# Patient Record
Sex: Female | Born: 1975
Health system: Southern US, Community
[De-identification: ages and names within clinical notes are randomized; demographics above are authoritative.]

## PROBLEM LIST (undated history)

## (undated) ENCOUNTER — Inpatient Hospital Stay (HOSPITAL_COMMUNITY): Payer: Self-pay

## (undated) DIAGNOSIS — N390 Urinary tract infection, site not specified: Secondary | ICD-10-CM

## (undated) DIAGNOSIS — E669 Obesity, unspecified: Secondary | ICD-10-CM

## (undated) DIAGNOSIS — E7800 Pure hypercholesterolemia, unspecified: Secondary | ICD-10-CM

## (undated) DIAGNOSIS — O24419 Gestational diabetes mellitus in pregnancy, unspecified control: Secondary | ICD-10-CM

## (undated) DIAGNOSIS — E119 Type 2 diabetes mellitus without complications: Secondary | ICD-10-CM

## (undated) HISTORY — DX: Pure hypercholesterolemia, unspecified: E78.00

## (undated) HISTORY — DX: Type 2 diabetes mellitus without complications: E11.9

## (undated) HISTORY — DX: Obesity, unspecified: E66.9

## (undated) HISTORY — DX: Gestational diabetes mellitus in pregnancy, unspecified control: O24.419

---

## 2001-02-25 ENCOUNTER — Inpatient Hospital Stay (HOSPITAL_COMMUNITY): Admission: AD | Admit: 2001-02-25 | Discharge: 2001-02-25 | Payer: Self-pay | Admitting: *Deleted

## 2001-02-25 ENCOUNTER — Encounter: Payer: Self-pay | Admitting: *Deleted

## 2001-03-26 ENCOUNTER — Encounter: Payer: Self-pay | Admitting: Emergency Medicine

## 2001-03-26 ENCOUNTER — Inpatient Hospital Stay (HOSPITAL_COMMUNITY): Admission: AD | Admit: 2001-03-26 | Discharge: 2001-03-26 | Payer: Self-pay | Admitting: *Deleted

## 2001-05-07 ENCOUNTER — Encounter: Payer: Self-pay | Admitting: Obstetrics and Gynecology

## 2001-05-07 ENCOUNTER — Inpatient Hospital Stay (HOSPITAL_COMMUNITY): Admission: AD | Admit: 2001-05-07 | Discharge: 2001-05-08 | Payer: Self-pay | Admitting: *Deleted

## 2001-05-20 ENCOUNTER — Encounter: Admission: RE | Admit: 2001-05-20 | Discharge: 2001-05-20 | Payer: Self-pay | Admitting: *Deleted

## 2001-05-23 ENCOUNTER — Inpatient Hospital Stay (HOSPITAL_COMMUNITY): Admission: AD | Admit: 2001-05-23 | Discharge: 2001-05-23 | Payer: Self-pay | Admitting: *Deleted

## 2001-05-27 ENCOUNTER — Encounter: Admission: RE | Admit: 2001-05-27 | Discharge: 2001-05-27 | Payer: Self-pay | Admitting: *Deleted

## 2001-06-09 ENCOUNTER — Ambulatory Visit (HOSPITAL_COMMUNITY): Admission: RE | Admit: 2001-06-09 | Discharge: 2001-06-09 | Payer: Self-pay | Admitting: *Deleted

## 2001-06-10 ENCOUNTER — Encounter: Admission: RE | Admit: 2001-06-10 | Discharge: 2001-06-10 | Payer: Self-pay | Admitting: *Deleted

## 2001-06-17 ENCOUNTER — Encounter: Admission: RE | Admit: 2001-06-17 | Discharge: 2001-06-17 | Payer: Self-pay | Admitting: *Deleted

## 2001-06-24 ENCOUNTER — Encounter: Admission: RE | Admit: 2001-06-24 | Discharge: 2001-06-24 | Payer: Self-pay | Admitting: *Deleted

## 2001-06-24 ENCOUNTER — Encounter (HOSPITAL_COMMUNITY): Admission: RE | Admit: 2001-06-24 | Discharge: 2001-07-24 | Payer: Self-pay | Admitting: *Deleted

## 2001-06-28 ENCOUNTER — Encounter: Payer: Self-pay | Admitting: Obstetrics and Gynecology

## 2001-06-28 ENCOUNTER — Inpatient Hospital Stay (HOSPITAL_COMMUNITY): Admission: AD | Admit: 2001-06-28 | Discharge: 2001-06-30 | Payer: Self-pay | Admitting: *Deleted

## 2001-07-21 ENCOUNTER — Encounter: Payer: Self-pay | Admitting: *Deleted

## 2001-07-24 ENCOUNTER — Inpatient Hospital Stay (HOSPITAL_COMMUNITY): Admission: AD | Admit: 2001-07-24 | Discharge: 2001-07-27 | Payer: Self-pay | Admitting: *Deleted

## 2004-03-22 ENCOUNTER — Ambulatory Visit: Payer: Self-pay | Admitting: Family Medicine

## 2004-03-22 ENCOUNTER — Ambulatory Visit: Payer: Self-pay | Admitting: *Deleted

## 2004-04-09 ENCOUNTER — Ambulatory Visit: Payer: Self-pay | Admitting: Family Medicine

## 2004-04-09 ENCOUNTER — Emergency Department (HOSPITAL_COMMUNITY): Admission: EM | Admit: 2004-04-09 | Discharge: 2004-04-10 | Payer: Self-pay | Admitting: Emergency Medicine

## 2004-04-11 ENCOUNTER — Ambulatory Visit: Payer: Self-pay | Admitting: Internal Medicine

## 2004-04-12 ENCOUNTER — Emergency Department (HOSPITAL_COMMUNITY): Admission: EM | Admit: 2004-04-12 | Discharge: 2004-04-12 | Payer: Self-pay | Admitting: Emergency Medicine

## 2004-07-25 ENCOUNTER — Ambulatory Visit: Payer: Self-pay | Admitting: Internal Medicine

## 2004-08-16 ENCOUNTER — Ambulatory Visit: Payer: Self-pay | Admitting: Family Medicine

## 2005-07-09 ENCOUNTER — Ambulatory Visit: Payer: Self-pay | Admitting: Internal Medicine

## 2006-01-23 ENCOUNTER — Ambulatory Visit: Payer: Self-pay | Admitting: Internal Medicine

## 2006-01-24 ENCOUNTER — Ambulatory Visit (HOSPITAL_COMMUNITY): Admission: RE | Admit: 2006-01-24 | Discharge: 2006-01-24 | Payer: Self-pay | Admitting: Internal Medicine

## 2006-04-21 ENCOUNTER — Ambulatory Visit: Payer: Self-pay | Admitting: Internal Medicine

## 2006-10-01 ENCOUNTER — Encounter (INDEPENDENT_AMBULATORY_CARE_PROVIDER_SITE_OTHER): Payer: Self-pay | Admitting: *Deleted

## 2006-12-05 ENCOUNTER — Ambulatory Visit: Payer: Self-pay | Admitting: Internal Medicine

## 2007-02-05 ENCOUNTER — Ambulatory Visit: Payer: Self-pay | Admitting: Internal Medicine

## 2007-02-08 ENCOUNTER — Emergency Department (HOSPITAL_COMMUNITY): Admission: EM | Admit: 2007-02-08 | Discharge: 2007-02-08 | Payer: Self-pay | Admitting: Emergency Medicine

## 2007-02-16 ENCOUNTER — Ambulatory Visit: Payer: Self-pay | Admitting: Internal Medicine

## 2007-03-23 ENCOUNTER — Ambulatory Visit: Payer: Self-pay | Admitting: Internal Medicine

## 2007-03-23 LAB — CONVERTED CEMR LAB
AST: 18 units/L (ref 0–37)
Alkaline Phosphatase: 84 units/L (ref 39–117)
BUN: 13 mg/dL (ref 6–23)
Basophils Relative: 0 % (ref 0–1)
Eosinophils Absolute: 0.2 10*3/uL (ref 0.0–0.7)
Eosinophils Relative: 3 % (ref 0–5)
Glucose, Bld: 95 mg/dL (ref 70–99)
HDL: 45 mg/dL (ref >39)
LDL Cholesterol: 87 mg/dL (ref 0–99)
Lymphocytes Relative: 34 % (ref 12–46)
Neutro Abs: 4.4 10*3/uL (ref 1.7–7.7)
Neutrophils Relative %: 57 % (ref 43–77)
Platelets: 286 10*3/uL (ref 150–400)
Potassium: 4 meq/L (ref 3.5–5.3)
RDW: 13.7 % (ref 11.5–15.5)
TSH: 0.674 microintl units/mL (ref 0.350–5.50)
Total Bilirubin: 0.4 mg/dL (ref 0.3–1.2)
Total CHOL/HDL Ratio: 3.2
Triglycerides: 64 mg/dL (ref <150)
VLDL: 13 mg/dL (ref 0–40)

## 2007-03-30 ENCOUNTER — Ambulatory Visit: Payer: Self-pay | Admitting: Internal Medicine

## 2008-02-19 ENCOUNTER — Ambulatory Visit: Payer: Self-pay | Admitting: Internal Medicine

## 2008-04-18 ENCOUNTER — Ambulatory Visit: Payer: Self-pay | Admitting: Family Medicine

## 2008-07-19 ENCOUNTER — Ambulatory Visit: Payer: Self-pay | Admitting: Internal Medicine

## 2008-07-27 ENCOUNTER — Ambulatory Visit: Payer: Self-pay | Admitting: Internal Medicine

## 2008-09-28 ENCOUNTER — Encounter: Payer: Self-pay | Admitting: Family Medicine

## 2008-09-28 ENCOUNTER — Ambulatory Visit: Payer: Self-pay | Admitting: Internal Medicine

## 2008-09-28 LAB — CONVERTED CEMR LAB: Prolactin: 5.7 ng/mL

## 2008-09-29 ENCOUNTER — Encounter: Payer: Self-pay | Admitting: Family Medicine

## 2008-10-10 ENCOUNTER — Encounter: Admission: RE | Admit: 2008-10-10 | Discharge: 2008-10-10 | Payer: Self-pay | Admitting: Family Medicine

## 2008-11-16 ENCOUNTER — Ambulatory Visit: Payer: Self-pay | Admitting: Internal Medicine

## 2009-02-09 ENCOUNTER — Ambulatory Visit: Payer: Self-pay | Admitting: Family Medicine

## 2009-02-13 ENCOUNTER — Ambulatory Visit (HOSPITAL_COMMUNITY): Admission: RE | Admit: 2009-02-13 | Discharge: 2009-02-13 | Payer: Self-pay | Admitting: Internal Medicine

## 2009-02-23 ENCOUNTER — Encounter: Admission: RE | Admit: 2009-02-23 | Discharge: 2009-02-23 | Payer: Self-pay | Admitting: Family Medicine

## 2009-05-26 ENCOUNTER — Inpatient Hospital Stay (HOSPITAL_COMMUNITY): Admission: AD | Admit: 2009-05-26 | Discharge: 2009-05-26 | Payer: Self-pay | Admitting: Obstetrics and Gynecology

## 2009-05-30 ENCOUNTER — Inpatient Hospital Stay (HOSPITAL_COMMUNITY): Admission: AD | Admit: 2009-05-30 | Discharge: 2009-05-30 | Payer: Self-pay | Admitting: Obstetrics and Gynecology

## 2009-06-09 ENCOUNTER — Ambulatory Visit: Payer: Self-pay | Admitting: Family Medicine

## 2009-06-09 ENCOUNTER — Encounter (INDEPENDENT_AMBULATORY_CARE_PROVIDER_SITE_OTHER): Payer: Self-pay | Admitting: Adult Health

## 2009-06-09 LAB — CONVERTED CEMR LAB
Basophils Absolute: 0 10*3/uL (ref 0.0–0.1)
Lymphocytes Relative: 34 % (ref 12–46)
Lymphs Abs: 3.3 10*3/uL (ref 0.7–4.0)
Neutrophils Relative %: 56 % (ref 43–77)
Platelets: 314 10*3/uL (ref 150–400)
RDW: 13.1 % (ref 11.5–15.5)
WBC: 9.6 10*3/uL (ref 4.0–10.5)

## 2009-06-16 ENCOUNTER — Ambulatory Visit: Payer: Self-pay | Admitting: Obstetrics & Gynecology

## 2009-08-16 ENCOUNTER — Emergency Department (HOSPITAL_COMMUNITY): Admission: EM | Admit: 2009-08-16 | Discharge: 2009-08-16 | Payer: Self-pay | Admitting: Emergency Medicine

## 2009-08-16 ENCOUNTER — Emergency Department (HOSPITAL_COMMUNITY): Admission: EM | Admit: 2009-08-16 | Discharge: 2009-08-16 | Payer: Self-pay | Admitting: Family Medicine

## 2009-08-18 ENCOUNTER — Ambulatory Visit: Payer: Self-pay | Admitting: Family Medicine

## 2009-09-13 ENCOUNTER — Ambulatory Visit: Payer: Self-pay | Admitting: Internal Medicine

## 2010-02-10 ENCOUNTER — Emergency Department (HOSPITAL_COMMUNITY)
Admission: EM | Admit: 2010-02-10 | Discharge: 2010-02-10 | Payer: Self-pay | Source: Home / Self Care | Admitting: Emergency Medicine

## 2010-02-10 LAB — DIFFERENTIAL
Basophils Absolute: 0 10*3/uL (ref 0.0–0.1)
Basophils Relative: 0 % (ref 0–1)
Monocytes Absolute: 0.8 10*3/uL (ref 0.1–1.0)
Neutro Abs: 5.5 10*3/uL (ref 1.7–7.7)
Neutrophils Relative %: 65 % (ref 43–77)

## 2010-02-10 LAB — COMPREHENSIVE METABOLIC PANEL
BUN: 7 mg/dL (ref 6–23)
CO2: 24 mEq/L (ref 19–32)
Chloride: 106 mEq/L (ref 96–112)
Creatinine, Ser: 0.52 mg/dL (ref 0.4–1.2)
GFR calc non Af Amer: >60 mL/min (ref >60)
Total Bilirubin: 0.1 mg/dL — ABNORMAL LOW (ref 0.3–1.2)

## 2010-02-10 LAB — URINE MICROSCOPIC-ADD ON

## 2010-02-10 LAB — CBC
Hemoglobin: 12.6 g/dL (ref 12.0–15.0)
MCHC: 34.5 g/dL (ref 30.0–36.0)
RDW: 13.3 % (ref 11.5–15.5)
WBC: 8.4 10*3/uL (ref 4.0–10.5)

## 2010-02-10 LAB — URINALYSIS, ROUTINE W REFLEX MICROSCOPIC
Specific Gravity, Urine: 1.01 (ref 1.005–1.030)
Urine Glucose, Fasting: NEGATIVE mg/dL
Urobilinogen, UA: 0.2 mg/dL (ref 0.0–1.0)

## 2010-02-10 LAB — POCT PREGNANCY, URINE: Preg Test, Ur: NEGATIVE

## 2010-02-10 LAB — LIPASE, BLOOD: Lipase: 17 U/L (ref 11–59)

## 2010-02-12 LAB — URINE CULTURE

## 2010-03-14 ENCOUNTER — Emergency Department (HOSPITAL_COMMUNITY): Payer: Self-pay

## 2010-03-14 ENCOUNTER — Emergency Department (HOSPITAL_COMMUNITY)
Admission: EM | Admit: 2010-03-14 | Discharge: 2010-03-14 | Disposition: A | Discharge status: Home / Self Care | Payer: Self-pay | Attending: Emergency Medicine | Admitting: Emergency Medicine

## 2010-03-14 DIAGNOSIS — R071 Chest pain on breathing: Secondary | ICD-10-CM | POA: Insufficient documentation

## 2010-03-14 DIAGNOSIS — R799 Abnormal finding of blood chemistry, unspecified: Secondary | ICD-10-CM | POA: Insufficient documentation

## 2010-03-14 DIAGNOSIS — R109 Unspecified abdominal pain: Secondary | ICD-10-CM | POA: Insufficient documentation

## 2010-03-14 DIAGNOSIS — K5900 Constipation, unspecified: Secondary | ICD-10-CM | POA: Insufficient documentation

## 2010-03-14 DIAGNOSIS — R079 Chest pain, unspecified: Secondary | ICD-10-CM | POA: Insufficient documentation

## 2010-03-14 LAB — COMPREHENSIVE METABOLIC PANEL
Alkaline Phosphatase: 49 U/L (ref 39–117)
BUN: 8 mg/dL (ref 6–23)
Chloride: 108 mEq/L (ref 96–112)
GFR calc non Af Amer: >60 mL/min (ref >60)
Glucose, Bld: 102 mg/dL — ABNORMAL HIGH (ref 70–99)
Potassium: 3.8 mEq/L (ref 3.5–5.1)
Total Bilirubin: 0.2 mg/dL — ABNORMAL LOW (ref 0.3–1.2)

## 2010-03-14 LAB — DIFFERENTIAL
Eosinophils Relative: 2 % (ref 0–5)
Lymphocytes Relative: 37 % (ref 12–46)
Lymphs Abs: 3.3 10*3/uL (ref 0.7–4.0)
Monocytes Absolute: 0.6 10*3/uL (ref 0.1–1.0)

## 2010-03-14 LAB — URINALYSIS, ROUTINE W REFLEX MICROSCOPIC
Protein, ur: NEGATIVE mg/dL
Urobilinogen, UA: 0.2 mg/dL (ref 0.0–1.0)

## 2010-03-14 LAB — CBC
Hemoglobin: 13.9 g/dL (ref 12.0–15.0)
MCHC: 34.6 g/dL (ref 30.0–36.0)

## 2010-03-14 LAB — D-DIMER, QUANTITATIVE: D-Dimer, Quant: 0.93 ug/mL-FEU — ABNORMAL HIGH (ref 0.00–0.48)

## 2010-03-14 MED ORDER — IOHEXOL 300 MG/ML  SOLN
100.0000 mL | Freq: Once | INTRAMUSCULAR | Status: AC | PRN
  Administered 2010-03-14: 100 mL via INTRAVENOUS

## 2010-03-30 LAB — URINE CULTURE
Colony Count: >=100000
Culture  Setup Time: 201108032118

## 2010-03-30 LAB — POCT URINALYSIS DIPSTICK
Hgb urine dipstick: NEGATIVE
Ketones, ur: NEGATIVE mg/dL
Protein, ur: NEGATIVE mg/dL
pH: 7 (ref 5.0–8.0)

## 2010-03-30 LAB — DIFFERENTIAL
Eosinophils Absolute: 0.1 10*3/uL (ref 0.0–0.7)
Eosinophils Relative: 2 % (ref 0–5)
Lymphs Abs: 3.4 10*3/uL (ref 0.7–4.0)
Monocytes Absolute: 0.7 10*3/uL (ref 0.1–1.0)
Monocytes Relative: 7 % (ref 3–12)

## 2010-03-30 LAB — URINALYSIS, ROUTINE W REFLEX MICROSCOPIC
Bilirubin Urine: NEGATIVE
Glucose, UA: NEGATIVE mg/dL
Hgb urine dipstick: NEGATIVE
Protein, ur: NEGATIVE mg/dL
Specific Gravity, Urine: <1.005 — ABNORMAL LOW (ref 1.005–1.030)
Urobilinogen, UA: 0.2 mg/dL (ref 0.0–1.0)

## 2010-03-30 LAB — CBC
HCT: 41.1 % (ref 36.0–46.0)
Hemoglobin: 14.1 g/dL (ref 12.0–15.0)
MCH: 31.8 pg (ref 26.0–34.0)
MCV: 92.8 fL (ref 78.0–100.0)
RBC: 4.43 MIL/uL (ref 3.87–5.11)

## 2010-03-30 LAB — POCT I-STAT, CHEM 8
BUN: 7 mg/dL (ref 6–23)
Creatinine, Ser: 0.7 mg/dL (ref 0.4–1.2)
Glucose, Bld: 84 mg/dL (ref 70–99)
Hemoglobin: 14.6 g/dL (ref 12.0–15.0)
TCO2: 26 mmol/L (ref 0–100)

## 2010-03-30 LAB — WET PREP, GENITAL: Trich, Wet Prep: NONE SEEN

## 2010-03-30 LAB — GC/CHLAMYDIA PROBE AMP, GENITAL: GC Probe Amp, Genital: NEGATIVE

## 2010-04-03 LAB — POCT PREGNANCY, URINE: Preg Test, Ur: POSITIVE

## 2010-04-03 LAB — WET PREP, GENITAL
Trich, Wet Prep: NONE SEEN
Yeast Wet Prep HPF POC: NONE SEEN

## 2010-04-03 LAB — CBC
MCV: 95.6 fL (ref 78.0–100.0)
Platelets: 258 10*3/uL (ref 150–400)
RBC: 3.95 MIL/uL (ref 3.87–5.11)
WBC: 7.9 10*3/uL (ref 4.0–10.5)

## 2010-04-03 LAB — URINALYSIS, ROUTINE W REFLEX MICROSCOPIC
Glucose, UA: NEGATIVE mg/dL
Ketones, ur: NEGATIVE mg/dL
Nitrite: NEGATIVE
Specific Gravity, Urine: 1.015 (ref 1.005–1.030)
pH: 7 (ref 5.0–8.0)

## 2010-04-03 LAB — HCG, QUANTITATIVE, PREGNANCY: hCG, Beta Chain, Quant, S: 2090 m[IU]/mL — ABNORMAL HIGH (ref <5)

## 2010-04-03 LAB — GC/CHLAMYDIA PROBE AMP, GENITAL
Chlamydia, DNA Probe: NEGATIVE
GC Probe Amp, Genital: NEGATIVE

## 2010-06-01 NOTE — Discharge Summary (Signed)
[facility]  Patient:    Kayla Scott, Kayla Scott Visit Number: [id number] MRN: [id number]          Service Type: OBS Location: [facility] Attending Physician:  Michaelle Copas Dictated by:   Ed Blalock. Burnadette Peter, M.D. Admit Date:  05/23/2001 Discharge Date: 05/23/2001                             Discharge Summary  HISTORY OF PRESENT ILLNESS:      The patient is a 35 year old, G4, P3-0-0-3 at 23-1/7 weeks who presented with vaginal bleeding that was painless the day of admission.  The patient denied any contractions, leakage of fluid.  She denied intercourse or abdominal pain.  She was seen at [facility].  The patient had a known complete previa, normal AFI at 18-plus weeks.  OBSTETRICAL HISTORY:             Her first pregnancy was a C-section secondary to failure to dilate.  She successfully had vaginal birth after cesarean x2.  GYNECOLOGICAL HISTORY:           Except for above, negative.  MEDICAL AND SURGICAL HISTORY:    Except for above, negative.  FAMILY HISTORY:                  Negative.  SOCIAL HISTORY:                  Negative.  PRENATAL LABORATORIES:           Prenatal labs were done and were normal.  PHYSICAL EXAMINATION:            On admission, vital signs were stable, she was afebrile.  She was in no acute distress.  Speculum only showed blood in the vault, negative visualization of cervix.  Digital cervical exam was not performed.  She was not contracting.  HOSPITAL COURSE:                 The patient was admitted for observation due to her vaginal bleeding and known previa.  The patient reported a cessation of bleeding later in the afternoon of date of admission and by hospital day #2, she had no bleeding and her hemoglobin and hematocrit were stable.  She was therefore, discharged home.  DISCHARGE ACTIVITY:              Bed rest and pelvic rest with nothing in vagina.  DISCHARGE MEDICATIONS:            None.  DISCHARGE DIET:                  Regular.  DISCHARGE FOLLOWUP:              She is to be seen at high risk clinic the following week.  She is to return to maternity admissions unit for any bleeding noted. Dictated by:   Ed Blalock. Burnadette Peter, M.D. Attending Physician:  Michaelle Copas DD:  06/11/01 TD:  06/13/01 Job: [id number]

## 2010-06-01 NOTE — Op Note (Signed)
[facility]  Patient:    Kayla Scott, Kayla Scott Visit Number: [id number] MRN: [id number]          Service Type: OBS Location: 910A 9112 01 Attending Physician:  Enid Cutter Dictated by:   Clement Husbands, M.D. Proc. Date: 07/24/01 Admit Date:  07/24/2001 Discharge Date: 07/27/2001                             Operative Report  PREOPERATIVE DIAGNOSIS:       A 34 week and three day pregnancy, complete                               placenta previa, probable early labor, bleeding.  POSTOPERATIVE DIAGNOSIS:      A 34 week and three day pregnancy, complete                               placenta previa, probable early labor, bleeding.  OPERATION:                    Repeat low transverse cervical cesarean section.  SURGEON:                      Clement Husbands, M.D.  ASSISTANT:                    Conni Elliot, M.D.  ANESTHESIA:                   Epidural.  DESCRIPTION OF PROCEDURE:     With the patient under satisfactory anesthesia in the dorsolithotomy position and slightly tilted to her left, and having already had the Foley catheter in place, the abdomen was prepped and draped. A low abdominal transverse skin incision was made and carried down through a very thick subcutaneous tissue layer to the rectus fascia which was transversely divided.  The peritoneal cavity was entered.  A few filmy omental adhesions in the left lower uterine segment.  These were brushed to the side. The vesicouterine peritoneum was incised and the bladder was pushed inferiorly.  The lower uterine segment was somewhat thinned out, indicating she was probably in early labor.  A low transverse incision was made a little higher than usual and extended laterally and more to the patients right than the left because of a large vessel that was on the left side.  Amniotic fluid was clear.  Vertex presentation was noted.  The vertex was delivered followed by  the thorax and the rest of the baby.  It was a female infant.  Spontaneous respirations and crying were noted.  The nasopharynx was suctioned.  The cord was doubly clamped and divided.  The infant, a female infant, was shown to the mother, and then given to the N&L staff.  Cord blood was obtained as well as blood gases.  The placenta was noted to be primarily posterior, but did sweep around and anterior over the internal os. The placenta was easily removed.  The uterus was explored and was negative. The internal cervical os was dilated.  Uterine incision was closed with a running locking 0 chromic catgut suture.  A second layer imbricated the first. A couple of bleeding sites midline required extra figure-of-eight sutures. The uterus which had been lifted out  of the abdominal cavity was repositioned. Fallopian tubes and ovaries were visualized and were normal on both sides. The bladder peritoneum was not reperitonealized.  The suture line was irrigated and hemostasis was good.  The anterior peritoneum was closed with a running 0 Vicryl suture.  The recti muscles were approximated in the midline with interrupted chromic suture.  The rectus fascia was closed with two segments of running locking 0 Vicryl suture. Hemostasis was good.  The subcutaneous tissue layer was irrigated and inspected.  No bleeding was noted.  Skin edges were approximated with wide skin staples.  The patient tolerated the procedure well and was returned to the recovery room in satisfactory condition.  Sponge and needle count were correct.  Estimated blood loss 600-700 cc. Dictated by:   Clement Husbands, M.D. Attending Physician:  Enid Cutter DD:  07/24/01 TD:  07/27/01 Job: [id number]

## 2010-06-01 NOTE — Group Therapy Note (Signed)
NAME:  Kayla, Scott NO.:  [id number]   MEDICAL RECORD NO.:  [id number]          PATIENT TYPE:  WOC   LOCATION:  [facility]                   FACILITY:  [facility]   PHYSICIAN:  Tinnie Gens, MD        DATE OF BIRTH:  Jun 30, 1975   DATE OF SERVICE:  08/16/2004                                    CLINIC NOTE   CHIEF COMPLAINT:  Referral for BTL.   HISTORY OF PRESENT ILLNESS:  The patient is a 35 year old gravida 4 para 4  whose last two infants have been C-sections. She delivered her last baby  with [place] in 2003. She has trouble with diabetes mellitus, previous C-section,  and varicose veins with pregnancy, and is looking for something for  permanent sterility. She is currently on the pill. She has tried the IUD in  the past and it has not really worked. The patient was not counseled  regarding the amount of money it would cost for a tubal ligation, which is  $3500 and must be paid up-front prior to this procedure. Several minutes  were spent instructing the patient about possible time when money is  available, and getting in touch with Vennie Homans to get her name on the  list for a tubal ligation if we get money available for this purpose, and  about vasectomy and having her follow up at [facility] for this. The  patient understood these risks. She will stay on the pill for now and she  will follow up with [place] as needed.   Thank you for this referral.       TP/MEDQ  D:  08/16/2004  T:  08/16/2004  Job:  [id number]   cc:   Dala Dock on [address]

## 2010-06-01 NOTE — Discharge Summary (Signed)
NAMEKIDA, DIGIULIO                         ACCOUNT NO.:  [id number]   MEDICAL RECORD NO.:  [id number]                   PATIENT TYPE:   LOCATION:                                       FACILITY:   PHYSICIAN:  Bradly Bienenstock, M.D.                   DATE OF BIRTH:  [birth year]   DATE OF ADMISSION:  07/24/2001  DATE OF DISCHARGE:  07/27/2001                                 DISCHARGE SUMMARY   DISCHARGE DIAGNOSES:  1. Intrauterine pregnancy delivered at [redacted] weeks gestation.  2. Complete placenta previa with status post sentinel bleed.  3. Gestational diabetes mellitus, diet controlled.  4. Status post cesarean section secondary to complete previa.   DISCHARGE MEDICATIONS:  1. Micronor oral contraceptives.  2. Percocet 5/325 number 14.  3. Ibuprofen 800 mg number 18.   FOLLOW UP:  The patient is to follow up in six weeks with her primary care  Miller Limehouse.   PRESENTING HISTORY:  This is a 35 year old G4, P3-0-0-3 now 4 who presented  to the women's maternity admissions at 34 weeks 3 days gestation dated by a  last menstrual period and second trimester ultrasound who presented with  vaginal bleeding in the face of a known complete previa.  On day of  admission, patient stated that she began having heavy vaginal bleeding  soaking through her clothes and some lower abdominal cramping and secondary  to this presented to maternity admissions.   OBSTETRICAL HISTORY:  Significant for two prior normal spontaneous vaginal  deliveries and one cesarean section of a female.  All three of these were  delivered in [place].   GYNECOLOGICAL HISTORY:  Negative last Pap in April 2003.   PAST MEDICAL HISTORY:  Significant for migraines.   SOCIAL HISTORY:  She is married, has been __________ normally.   LABORATORIES:  The patient had O+ blood type.  Negative antibody screen.  Negative hepatitis screen.  Negative syphilis, HIV, GC, and Chlamydia were  all negative.  Hemoglobin outpatient record was  13.5, platelets 278,000.   Vital signs on admission were stable.  Fetal heart tracing was found to be  at a baseline of 125-130 with a reassuring, but nonreactive strip.  The  patient was contracting roughly once every 20 minutes.  Physical  examination:  The patient was generally in no acute distress, but had quite  obvious vaginal bleeding.  Hemoglobin at the time of admit was 12.5.  Assessment at that time was a 35 year old G4, P3-0-0-3 who presented with  what appeared to be quite heavy bleeding in the face of a placenta previa.  Secondary to this she it was decided to take her back for emergent cesarean  section.    HOSPITAL COURSE:  The patient underwent a low transverse cesarean section on  July 24, 2001.  For full description of that surgery, please see dictated  note from Dr. Orlene Erm on that date.  Patient  gave birth to a female infant who  was vigorous upon delivery.  The patient's hospital course was  uncomplicated.  The patient at time of discharge had decided to breast feed  and use Micronor as contraception.  She was to follow up with her outpatient  physician.                                               Bradly Bienenstock, M.D.    Arliss Journey  D:  10/02/2001  T:  10/05/2001  Job:  [id number]

## 2010-06-01 NOTE — Discharge Summary (Signed)
[facility]  Patient:    Kayla Scott, BUCKELS Visit Number: [id number] MRN: [id number]          Service Type: OBS Location: 910A 9112 01 Attending Physician:  Enid Cutter Dictated by:   Juanell Fairly, M.D. Admit Date:  07/24/2001 Discharge Date: 07/27/2001                             Discharge Summary  DISCHARGE DIAGNOSIS:          Placenta previa.  ACTIVITY:                     The patient was discharged with instructions to refrain from sexual activity.  FOLLOW-UP:                    Follow up at the high-risk clinic on July 07, 2001, sooner if she experienced anymore vaginal bleeding.  DISCHARGE MEDICATIONS:        The patient was not discharged on any medications.  HISTORY OF PRESENT ILLNESS:   The patient is a 35 year old, G4, P3-0-0-3, who presented at 30 weeks 4 days gestation with known previa and vaginal bleeding. Due to concerns of abruption, the patient was admitted.  HOSPITAL COURSE:              A DIC panel was drawn.  The patient was typed and screened and observed for the next two days.  She was given betamethasone to enhance lung maturity.  The DIC panel was within normal limits.  Due to the fact that the patient had excellent home support and that her sister, who lives with the patient, can care for her children, who are at home and that the patient could get to the hospital within eight minutes, the danger of intercourse and organism were explained to the patient.  After remaining stable until June 30, 2001, the patient was allowed to go home with the understanding that she return to the MAU immediately if she had any more bleeding. Dictated by:   Juanell Fairly, M.D. Attending Physician:  Enid Cutter DD:  08/03/01 TD:  08/08/01 Job: [id number]

## 2010-06-01 NOTE — H&P (Signed)
[facility]  Patient:    Kayla Scott, Kayla Scott Visit Number: [id number] MRN: [id number]          Service Type: OBS Location: 910A 9199 01 Attending Physician:  Enid Cutter Dictated by:   Clement Husbands, M.D. Admit Date:  07/24/2001                           History and Physical  HISTORY OF PRESENT ILLNESS:   A 35 year old Hispanic female, gravida 4, para 3, last menstrual period November 25, 2000 with an [facility] of about August 19 has been followed in the high-risk clinic with a suspected placenta previa.  Her last pregnancy was delivered by cesarean section for low amniotic fluid.  The incision was a vertical skin incision.  The patient was hospitalized here at [facility] at least a few weeks ago with some spotting, at which time, I believe suspected previa was diagnosed.  She was sent home.  She was a gestational diabetic with diet control.  Ultrasound reports are with the prenatal record.  She presented last night to maternity admissions.  She was having vaginal bleeding, which had soaked through her clothing and had covered her legs and feet.  She was having low abdominal cramping that began about 1 a.m.  The hurting however had decreased as had the bleeding.  Ultrasound was obtained (result is not on the chart at this time), which revealed a complete placenta previa and most likely the placenta was otherwise posterior.  She has been observed in labor and delivery.  She was seen by Dr. Conni Elliot this morning.  Because she had started having some bleeding again, as well as low abdominal pain thought to be early labor, it was decided that it was time for delivery and it would be by cesarean section.  PAST MEDICAL HISTORY:         OPERATIONS:  Her last baby was delivered by cesarean section in [place].  GYN:  Her Pap smears have been negative.  FAMILY/SOCIAL HISTORY:        She is married and speaks Spanish  only.  PRENATAL LABORATORY WORK:     Pretty much unremarkable except for a 1-hour glucose of 139.  She is O-positive with negative antibody screening.  PHYSICAL EXAMINATION:  VITAL SIGNS:                  Blood pressure is normal.  BREASTS:                      Normal.  CHEST:                        Clear to auscultation and percussion.  HEART:                        Regular sinus rhythm without murmurs, ______ .  ABDOMEN:                      Gravid.  She has a well-healed low abdominal vertical skin scar.  She has some tenderness in her lower abdomen.  VAGINAL EXAMINATION:          Not performed.  IMPRESSION:                   A 34-week, 3-day pregnancy with complete placenta previa, probable early labor  and vaginal bleeding. Dictated by:   Clement Husbands, M.D. Attending Physician:  Enid Cutter DD:  07/21/01 TD:  07/24/01 Job: [id number]

## 2011-04-26 ENCOUNTER — Other Ambulatory Visit: Payer: Self-pay | Admitting: Family Medicine

## 2011-09-18 ENCOUNTER — Emergency Department (HOSPITAL_COMMUNITY)
Admission: EM | Admit: 2011-09-18 | Discharge: 2011-09-18 | Disposition: A | Discharge status: Home / Self Care | Payer: Self-pay | Attending: Emergency Medicine | Admitting: Emergency Medicine

## 2011-09-18 ENCOUNTER — Emergency Department (HOSPITAL_COMMUNITY): Payer: Self-pay

## 2011-09-18 ENCOUNTER — Encounter (HOSPITAL_COMMUNITY): Payer: Self-pay | Admitting: *Deleted

## 2011-09-18 DIAGNOSIS — R1012 Left upper quadrant pain: Secondary | ICD-10-CM | POA: Insufficient documentation

## 2011-09-18 DIAGNOSIS — R1013 Epigastric pain: Secondary | ICD-10-CM | POA: Insufficient documentation

## 2011-09-18 DIAGNOSIS — R109 Unspecified abdominal pain: Secondary | ICD-10-CM

## 2011-09-18 LAB — CBC WITH DIFFERENTIAL/PLATELET
Basophils Absolute: 0 10*3/uL (ref 0.0–0.1)
Basophils Relative: 0 % (ref 0–1)
Eosinophils Absolute: 0.2 10*3/uL (ref 0.0–0.7)
Eosinophils Relative: 2 % (ref 0–5)
HCT: 40.3 % (ref 36.0–46.0)
Lymphocytes Relative: 43 % (ref 12–46)
MCH: 32 pg (ref 26.0–34.0)
MCHC: 35.7 g/dL (ref 30.0–36.0)
MCV: 89.6 fL (ref 78.0–100.0)
Monocytes Absolute: 0.6 10*3/uL (ref 0.1–1.0)
Platelets: 316 10*3/uL (ref 150–400)
RDW: 12.9 % (ref 11.5–15.5)
WBC: 9.7 10*3/uL (ref 4.0–10.5)

## 2011-09-18 LAB — URINALYSIS, ROUTINE W REFLEX MICROSCOPIC
Glucose, UA: NEGATIVE mg/dL
Hgb urine dipstick: NEGATIVE
Ketones, ur: NEGATIVE mg/dL
Protein, ur: NEGATIVE mg/dL
Urobilinogen, UA: 0.2 mg/dL (ref 0.0–1.0)

## 2011-09-18 LAB — BASIC METABOLIC PANEL
CO2: 20 mEq/L (ref 19–32)
Calcium: 9.4 mg/dL (ref 8.4–10.5)
Creatinine, Ser: 0.52 mg/dL (ref 0.50–1.10)
GFR calc non Af Amer: >90 mL/min (ref >90)
Sodium: 135 mEq/L (ref 135–145)

## 2011-09-18 LAB — URINE MICROSCOPIC-ADD ON

## 2011-09-18 MED ORDER — HYDROMORPHONE HCL PF 1 MG/ML IJ SOLN
1.0000 mg | Freq: Once | INTRAMUSCULAR | Status: AC
  Administered 2011-09-18: 1 mg via INTRAVENOUS
  Filled 2011-09-18: qty 1

## 2011-09-18 MED ORDER — FAMOTIDINE 20 MG PO TABS: 20.0000 mg | ORAL_TABLET | Freq: Two times a day (BID) | ORAL | Status: DC

## 2011-09-18 MED ORDER — FAMOTIDINE 20 MG PO TABS
20.0000 mg | ORAL_TABLET | Freq: Once | ORAL | Status: AC
  Administered 2011-09-18: 20 mg via ORAL
  Filled 2011-09-18: qty 1

## 2011-09-18 MED ORDER — PANTOPRAZOLE SODIUM 40 MG IV SOLR
40.0000 mg | Freq: Once | INTRAVENOUS | Status: AC
  Administered 2011-09-18: 40 mg via INTRAVENOUS
  Filled 2011-09-18: qty 40

## 2011-09-18 MED ORDER — GI COCKTAIL ~~LOC~~
30.0000 mL | Freq: Once | ORAL | Status: AC
  Administered 2011-09-18: 30 mL via ORAL
  Filled 2011-09-18: qty 30

## 2011-09-18 MED ORDER — SODIUM CHLORIDE 0.9 % IV SOLN
INTRAVENOUS | Status: DC
  Administered 2011-09-18: 05:00:00 via INTRAVENOUS

## 2011-09-18 MED ORDER — ONDANSETRON HCL 4 MG/2ML IJ SOLN
4.0000 mg | Freq: Once | INTRAMUSCULAR | Status: AC
  Administered 2011-09-18: 4 mg via INTRAVENOUS
  Filled 2011-09-18: qty 2

## 2011-09-18 NOTE — ED Provider Notes (Signed)
History     CSN: [id number]  Arrival date & time 09/18/11  0049   First MD Initiated Contact with Patient 09/18/11 0408      Chief Complaint  Patient presents with  . Abdominal Pain    (Consider location/radiation/quality/duration/timing/severity/associated sxs/prior treatment) HPI HX per PT and her husband. Epigastric and upper ABD pain on and off worse tonight, no h/o GB problems, denies heartburn, pain is sharp and does not seem to be related to eating, no F/C, no recent ABx, no recent travel. No dysuria/ urgency/ frequency. Mod in severity, worse tonight, no back pain or SOB History reviewed. No pertinent past medical history.  Past Surgical History  Procedure Date  . Cesarean section     History reviewed. No pertinent family history.  History  Substance Use Topics  . Smoking status: Never Smoker   . Smokeless tobacco: Not on file  . Alcohol Use: No    OB History    Grav Para Term Preterm Abortions TAB SAB Ect Mult Living                  Review of Systems  Constitutional: Negative for fever and chills.  HENT: Negative for neck pain and neck stiffness.   Eyes: Negative for pain.  Respiratory: Negative for shortness of breath.   Cardiovascular: Negative for chest pain.  Gastrointestinal: Positive for abdominal pain. Negative for diarrhea, constipation, blood in stool and anal bleeding.  Genitourinary: Negative for dysuria.  Musculoskeletal: Negative for back pain.  Skin: Negative for rash.  Neurological: Negative for headaches.  All other systems reviewed and are negative.    Allergies  Review of patient's allergies indicates no known allergies.  Home Medications   Current Outpatient Rx  Name Route Sig Dispense Refill  . IBUPROFEN 200 MG PO TABS Oral Take 400 mg by mouth every 8 (eight) hours as needed. For pain      BP 110/64  Pulse 82  Temp 98.7 F (37.1 C) (Oral)  Resp 20  SpO2 100%  LMP 06/18/2011  Physical Exam  Constitutional: She is  oriented to person, place, and time. She appears well-developed and well-nourished.  HENT:  Head: Normocephalic and atraumatic.  Eyes: Conjunctivae and EOM are normal. Pupils are equal, round, and reactive to light.  Neck: Trachea normal. Neck supple. No thyromegaly present.  Cardiovascular: Normal rate, regular rhythm, S1 normal, S2 normal and normal pulses.     No systolic murmur is present   No diastolic murmur is present  Pulses:      Radial pulses are 2+ on the right side, and 2+ on the left side.  Pulmonary/Chest: Effort normal and breath sounds normal. She has no wheezes. She has no rhonchi. She has no rales. She exhibits no tenderness.  Abdominal: Soft. Normal appearance and bowel sounds are normal. There is no CVA tenderness and negative Murphy's sign.       Mild RUQ. Epigastric and LUQ TTP, no periumbilical or lower quad tenderness. No peritonitis  Musculoskeletal:       BLE:s Calves nontender, no cords or erythema, negative Homans sign  Neurological: She is alert and oriented to person, place, and time. She has normal strength. No cranial nerve deficit or sensory deficit. GCS eye subscore is 4. GCS verbal subscore is 5. GCS motor subscore is 6.  Skin: Skin is warm and dry. No rash noted. She is not diaphoretic.  Psychiatric: Her speech is normal.       Cooperative and appropriate  ED Course  Procedures (including critical care time)  Results for orders placed during the hospital encounter of 09/18/11  CBC WITH DIFFERENTIAL      Component Value Range   WBC 9.7  4.0 - 10.5 K/uL   RBC 4.50  3.87 - 5.11 MIL/uL   Hemoglobin 14.4  12.0 - 15.0 g/dL   HCT 91.4  78.2 - 95.6 %   MCV 89.6  78.0 - 100.0 fL   MCH 32.0  26.0 - 34.0 pg   MCHC 35.7  30.0 - 36.0 g/dL   RDW 21.3  08.6 - 57.8 %   Platelets 316  150 - 400 K/uL   Neutrophils Relative 49  43 - 77 %   Neutro Abs 4.8  1.7 - 7.7 K/uL   Lymphocytes Relative 43  12 - 46 %   Lymphs Abs 4.2 (*) 0.7 - 4.0 K/uL   Monocytes  Relative 6  3 - 12 %   Monocytes Absolute 0.6  0.1 - 1.0 K/uL   Eosinophils Relative 2  0 - 5 %   Eosinophils Absolute 0.2  0.0 - 0.7 K/uL   Basophils Relative 0  0 - 1 %   Basophils Absolute 0.0  0.0 - 0.1 K/uL  BASIC METABOLIC PANEL      Component Value Range   Sodium 135  135 - 145 mEq/L   Potassium 3.7  3.5 - 5.1 mEq/L   Chloride 101  96 - 112 mEq/L   CO2 20  19 - 32 mEq/L   Glucose, Bld 94  70 - 99 mg/dL   BUN 8  6 - 23 mg/dL   Creatinine, Ser 4.69  0.50 - 1.10 mg/dL   Calcium 9.4  8.4 - 62.9 mg/dL   GFR calc non Af Amer >90  >90 mL/min   GFR calc Af Amer >90  >90 mL/min  URINALYSIS, ROUTINE W REFLEX MICROSCOPIC      Component Value Range   Color, Urine YELLOW  YELLOW   APPearance CLOUDY (*) CLEAR   Specific Gravity, Urine 1.018  1.005 - 1.030   pH 6.0  5.0 - 8.0   Glucose, UA NEGATIVE  NEGATIVE mg/dL   Hgb urine dipstick NEGATIVE  NEGATIVE   Bilirubin Urine NEGATIVE  NEGATIVE   Ketones, ur NEGATIVE  NEGATIVE mg/dL   Protein, ur NEGATIVE  NEGATIVE mg/dL   Urobilinogen, UA 0.2  0.0 - 1.0 mg/dL   Nitrite NEGATIVE  NEGATIVE   Leukocytes, UA MODERATE (*) NEGATIVE  PREGNANCY, URINE      Component Value Range   Preg Test, Ur NEGATIVE  NEGATIVE  URINE MICROSCOPIC-ADD ON      Component Value Range   Squamous Epithelial / LPF FEW (*) RARE   WBC, UA 3-6  <3 WBC/hpf   RBC / HPF 0-2  <3 RBC/hpf   Bacteria, UA MANY (*) RARE   Urine-Other MUCOUS PRESENT     US Abdomen Complete  09/18/2011  *RADIOLOGY REPORT*  Clinical Data:  Right upper quadrant pain for 1 year, now worse  ULTRASOUND ABDOMEN:  Technique:  Sonography of upper abdominal structures was performed.  Comparison:  None Correlation:  CT abdomen 03/14/2010  Gallbladder:  Distended without shadowing calculi, wall thickening, or pericholecystic fluid.  Scattered low level internal echoes are seen which likely represent artifacts secondary to body habitus but making it difficult to exclude minimal sludge.  No sonographic  Murphy's sign.  Common bile duct:  5 mm diameter, normal  Liver:  Coarsened minimally increased  echogenicity question fatty infiltration.  No definite mass or nodularity no intrahepatic detail is suboptimally visualized due to body habitus.  IVC:  Normal appearance  Pancreas:  Portions of head and distal tail obscured by bowel gas with visualized portions normal appearance.  Spleen:  Normal appearance, 7.6 cm length.  Right kidney:  10.4 cm length. Normal morphology without mass or hydronephrosis.  Left kidney:  11.1 cm length. Normal morphology without mass or hydronephrosis.  Aorta:  Suboptimally visualized distally due to bowel gas, proximal portion normal caliber.  Other:  No free fluid  IMPRESSION: Question minimal fatty infiltration of liver. No definite evidence of cholelithiasis or acute cholecystitis as above.   Original Report Authenticated By: Lollie Marrow, M.D.     IVFs, GI cocktail, IV dilaudid  6:11 AM pain much improved. ABD remains soft and on recheck is nontender, GI cocktail helped most.   Plan d/c home, Rx Pepcid, GERD precautions, ABd pain precautionas and GI referral MDM   VS and nursing notes reviewed. IV narcotics, [place] and labs. UA as above _ U Cx sent.         Sunnie Nielsen, MD 09/18/11 [phone]

## 2011-09-18 NOTE — ED Notes (Signed)
Per pt's family member at bedside - pt has been experiencing left-sided abd pain x1 month - pt denies any urinary symptoms, n/v/d or fever at present. Pain has gotten progressively worse.

## 2011-09-21 LAB — URINE CULTURE: Colony Count: 30000

## 2011-09-22 NOTE — ED Notes (Signed)
+   Urine Chart sent to EDP office for review. 

## 2011-09-23 NOTE — ED Notes (Signed)
Chart returned from EDP office with order written by Grant Fontana for Keflex 25 mg po 4 times daily x 7 days #28 needs to be called to pharmacy.

## 2011-09-24 NOTE — ED Notes (Signed)
Patient notified.Rx called to [facility] [phone] pharmacy by Sheralyn Boatman PFM

## 2011-10-31 ENCOUNTER — Ambulatory Visit: Payer: Self-pay | Admitting: Family Medicine

## 2012-02-07 ENCOUNTER — Inpatient Hospital Stay (HOSPITAL_COMMUNITY): Payer: No Typology Code available for payment source

## 2012-02-07 ENCOUNTER — Encounter (HOSPITAL_COMMUNITY): Payer: Self-pay

## 2012-02-07 ENCOUNTER — Inpatient Hospital Stay (HOSPITAL_COMMUNITY)
Admission: AD | Admit: 2012-02-07 | Discharge: 2012-02-07 | Disposition: A | Discharge status: Home / Self Care | Payer: No Typology Code available for payment source | Source: Ambulatory Visit | Attending: Obstetrics & Gynecology | Admitting: Obstetrics & Gynecology

## 2012-02-07 DIAGNOSIS — R109 Unspecified abdominal pain: Secondary | ICD-10-CM | POA: Insufficient documentation

## 2012-02-07 DIAGNOSIS — O209 Hemorrhage in early pregnancy, unspecified: Secondary | ICD-10-CM

## 2012-02-07 HISTORY — DX: Urinary tract infection, site not specified: N39.0

## 2012-02-07 LAB — URINALYSIS, ROUTINE W REFLEX MICROSCOPIC
Bilirubin Urine: NEGATIVE
Ketones, ur: NEGATIVE mg/dL
Nitrite: NEGATIVE
Protein, ur: NEGATIVE mg/dL
pH: 7 (ref 5.0–8.0)

## 2012-02-07 LAB — WET PREP, GENITAL
Clue Cells Wet Prep HPF POC: NONE SEEN
Yeast Wet Prep HPF POC: NONE SEEN

## 2012-02-07 LAB — URINE MICROSCOPIC-ADD ON

## 2012-02-07 LAB — CBC
MCH: 32 pg (ref 26.0–34.0)
MCHC: 34.4 g/dL (ref 30.0–36.0)
Platelets: 279 10*3/uL (ref 150–400)
RBC: 4.41 MIL/uL (ref 3.87–5.11)

## 2012-02-07 NOTE — MAU Note (Signed)
Patient is in with c/o new onset of llq sharp pain that started last night at 2100pm and heavier vaginal bleeding. Patient states that pregnancy was confirmed a week ago at an urgent care, was diagnosed with UTI and have been spotting since then.

## 2012-02-07 NOTE — MAU Provider Note (Signed)
Chief Complaint  Patient presents with  . Vaginal Bleeding  . Abdominal Pain    Subjective Kayla Scott 37 y.o.  [id number] [redacted]w[redacted]d by LMP presents with onset 4d of first episode of spotting but had heavier bleeding with small clots last night.  Has menstrual-like crampy lower abdominal pain L suprapubic>R. Marland Kitchen Last intercourse 1 wk ago. .  Denies abnormal vaginal discharge but vaginal itching since 5d ago . No dysuria or hematuria. On MCN (2 doses since Rx 2 d ago; also had Im abx that day) ) for ASB from [facility].  Blood type: Opos  Pertinent medical history: N/C Pertinent Ob/Gyn history: N/C  Pertinent surgical history: C/S x 2    Objective   Filed Vitals:   02/07/12 1022  BP: 115/80  Pulse: 108  Temp:   Resp: 20     Physical Exam General: WN/WD in NAD  Abdom: soft, NT Pelvic:External genitalia: normal; BUS neg, no blood             Spec: small amount reddish blood in vault, small clots                      Cx multiparous, no lesions, appears closed w/o active bleeding           Bimanual: Cx closed, long; no CMT                             Uterus anteverted, NT, 8 weeks size                             Adnexae slightly tender on left, no masses felt   Lab Results Results for orders placed during the hospital encounter of 02/07/12 (from the past 24 hour(s))  CBC     Status: Normal   Collection Time   02/07/12  7:55 AM      Component Value Range   WBC 7.8  4.0 - 10.5 K/uL   RBC 4.41  3.87 - 5.11 MIL/uL   Hemoglobin 14.1  12.0 - 15.0 g/dL   HCT 08.6  57.8 - 46.9 %   MCV 93.0  78.0 - 100.0 fL   MCH 32.0  26.0 - 34.0 pg   MCHC 34.4  30.0 - 36.0 g/dL   RDW 62.9  52.8 - 41.3 %   Platelets 279  150 - 400 K/uL  URINALYSIS, ROUTINE W REFLEX MICROSCOPIC     Status: Abnormal   Collection Time   02/07/12 10:02 AM      Component Value Range   Color, Urine YELLOW  YELLOW   APPearance CLEAR  CLEAR   Specific Gravity, Urine <1.005 (*) 1.005 - 1.030   pH  7.0  5.0 - 8.0   Glucose, UA NEGATIVE  NEGATIVE mg/dL   Hgb urine dipstick LARGE (*) NEGATIVE   Bilirubin Urine NEGATIVE  NEGATIVE   Ketones, ur NEGATIVE  NEGATIVE mg/dL   Protein, ur NEGATIVE  NEGATIVE mg/dL   Urobilinogen, UA 0.2  0.0 - 1.0 mg/dL   Nitrite NEGATIVE  NEGATIVE   Leukocytes, UA NEGATIVE  NEGATIVE  URINE MICROSCOPIC-ADD ON     Status: Normal   Collection Time   02/07/12 10:02 AM      Component Value Range   Squamous Epithelial / LPF RARE  RARE   WBC, UA 0-2  <3 WBC/hpf   RBC / HPF 21-50  <  3 RBC/hpf   Bacteria, UA RARE  RARE  POCT PREGNANCY, URINE     Status: Abnormal   Collection Time   02/07/12 10:29 AM      Component Value Range   Preg Test, Ur POSITIVE (*) NEGATIVE  WET PREP, GENITAL     Status: Abnormal   Collection Time   02/07/12 10:38 AM      Component Value Range   Yeast Wet Prep HPF POC NONE SEEN  NONE SEEN   Trich, Wet Prep NONE SEEN  NONE SEEN   Clue Cells Wet Prep HPF POC NONE SEEN  NONE SEEN   WBC, Wet Prep HPF POC FEW (*) NONE SEEN  HCG, QUANTITATIVE, PREGNANCY     Status: Abnormal   Collection Time   02/07/12 11:25 AM      Component Value Range   hCG, Beta Chain, Quant, S 3915 (*) <5 mIU/mL    Ultrasound  GS YS fetal pole w/ no heatbeat in LUS> impending SAB or abnormal implant     Assessment 1. Bleeding in early pregnancy   IUP, viablity undetermined Threatened SAB, ? Scar implant   Plan    GC/CT sent Discharge with AVS on Early Pregnancy Bleeding and Threatened Miscarriage Follow-up Information    On 02/14/2012 to follow up. (Ultrasound at 0830, then follow up in MAU)           Philamena Kramar 02/07/2012 10:42 AM

## 2012-02-07 NOTE — MAU Note (Signed)
Pt states went to general medical clinic and had positive preg test done. Also noted severe kidney infection, was given injection and abx. Began bleeding Monday small amount. Now passing clots.

## 2012-02-08 LAB — GC/CHLAMYDIA PROBE AMP: GC Probe RNA: NEGATIVE

## 2012-02-14 ENCOUNTER — Other Ambulatory Visit (HOSPITAL_COMMUNITY): Payer: Self-pay | Admitting: Obstetrics and Gynecology

## 2012-02-14 ENCOUNTER — Inpatient Hospital Stay (HOSPITAL_COMMUNITY)
Admission: AD | Admit: 2012-02-14 | Discharge: 2012-02-14 | Disposition: A | Discharge status: Home / Self Care | Payer: No Typology Code available for payment source | Source: Ambulatory Visit | Attending: Obstetrics and Gynecology | Admitting: Obstetrics and Gynecology

## 2012-02-14 ENCOUNTER — Ambulatory Visit (HOSPITAL_COMMUNITY)
Admit: 2012-02-14 | Discharge: 2012-02-14 | Disposition: A | Discharge status: Home / Self Care | Payer: No Typology Code available for payment source | Attending: Obstetrics and Gynecology | Admitting: Obstetrics and Gynecology

## 2012-02-14 DIAGNOSIS — O209 Hemorrhage in early pregnancy, unspecified: Secondary | ICD-10-CM

## 2012-02-14 DIAGNOSIS — O200 Threatened abortion: Secondary | ICD-10-CM | POA: Insufficient documentation

## 2012-02-14 DIAGNOSIS — O039 Complete or unspecified spontaneous abortion without complication: Secondary | ICD-10-CM

## 2012-02-14 DIAGNOSIS — Z3689 Encounter for other specified antenatal screening: Secondary | ICD-10-CM | POA: Insufficient documentation

## 2012-02-14 DIAGNOSIS — O3680X0 Pregnancy with inconclusive fetal viability, not applicable or unspecified: Secondary | ICD-10-CM | POA: Insufficient documentation

## 2012-02-14 LAB — HEMOGLOBIN AND HEMATOCRIT, BLOOD
HCT: 40.3 % (ref 36.0–46.0)
Hemoglobin: 13.7 g/dL (ref 12.0–15.0)

## 2012-02-14 MED ORDER — MISOPROSTOL 200 MCG PO TABS: 200.0000 ug | ORAL_TABLET | Freq: Four times a day (QID) | ORAL | Status: DC

## 2012-02-14 NOTE — MAU Note (Signed)
Patient to MAU after ultrasound. Patient states she is having a little pain and a little bleeding.

## 2012-02-14 NOTE — MAU Provider Note (Signed)
  History     CSN: [id number]  Arrival date and time: 02/14/12 0848   None     Chief Complaint  Patient presents with  . Follow-up   HPIJosefina Scott is 37 y.o. [id number] [redacted]w[redacted]d weeks presenting with repeat ultrasound for viability.  She reports bleeding and cramping that is unchanged since her last visit.  She was initially seen 1/24 with spotting and cramping.  On that visit, U/S showed GS in lower uterine segment with YS, no heartbeat.  Per previous record Blood type is 0 Positive.      Past Medical History  Diagnosis Date  . UTI (lower urinary tract infection)     Past Surgical History  Procedure Date  . Cesarean section     No family history on file.  History  Substance Use Topics  . Smoking status: Never Smoker   . Smokeless tobacco: Not on file  . Alcohol Use: No    Allergies: No Known Allergies  Prescriptions prior to admission  Medication Sig Dispense Refill  . folic acid (FOLVITE) 400 MCG tablet Take 400 mcg by mouth daily.      . nitrofurantoin, macrocrystal-monohydrate, (MACROBID) 100 MG capsule Take 100 mg by mouth 2 (two) times daily. 10 day supply, not yet completed course      . omega-3 acid ethyl esters (LOVAZA) 1 G capsule Take 1 g by mouth daily.      . Prenatal Vit-Fe Fumarate-FA (PRENATAL MULTIVITAMIN) TABS Take 1 tablet by mouth daily.        Review of Systems  Constitutional: Negative.   Gastrointestinal: Positive for abdominal pain (mild cramping).  Genitourinary:       + for vaginal bleeding   Physical Exam   Blood pressure 124/72, pulse 77, temperature 98.7 F (37.1 C), temperature source Oral, resp. rate 18, last menstrual period 12/07/2011, SpO2 100.00%.  Physical Exam  Constitutional: She is oriented to person, place, and time. She appears well-developed and well-nourished. No distress.  HENT:  Head: Normocephalic.  Neck: Normal range of motion.  Cardiovascular: Normal rate.   Respiratory: Effort normal.    Neurological: She is alert and oriented to person, place, and time.  Skin: Skin is warm and dry.  Psychiatric: She has a normal mood and affect. Her behavior is normal.   Results for orders placed during the hospital encounter of 02/14/12 (from the past 24 hour(s))  HEMOGLOBIN AND HEMATOCRIT, BLOOD     Status: Normal   Collection Time   02/14/12  9:39 AM      Component Value Range   Hemoglobin 13.7  12.0 - 15.0 g/dL   HCT 84.6  96.2 - 95.2 %      MAU Course  Procedures  MDM Discussed ultrasound findings with the patient (with Shanda Bumps, interpreter).  Discussed option of Cytotec or expected management.  It is patient's clear choice for cytotec.  Explained use of medication, expected bleeding, and follow up in GYN CLINIC in 2 weeks.  Assessment and Plan  A:  Inevitable miscarriage  P:  Rx for Cytotec to pharmacy     Instructed patient to insertttabs in vaginal tonight    Follow up in clinic in 2 weeks   Return for heavy vaginal bleeding or severe pain.  KEY,EVE M 02/14/2012, 9:15 AM

## 2012-02-14 NOTE — MAU Provider Note (Signed)
Attestation of Attending Supervision of Advanced Practitioner (CNM/NP): Evaluation and management procedures were performed by the Advanced Practitioner under my supervision and collaboration.  I have reviewed the Advanced Practitioner's note and chart, and I agree with the management and plan.  Marcelo Ickes 02/14/2012 11:07 AM

## 2012-03-04 ENCOUNTER — Ambulatory Visit (INDEPENDENT_AMBULATORY_CARE_PROVIDER_SITE_OTHER): Payer: No Typology Code available for payment source | Admitting: Obstetrics and Gynecology

## 2012-03-04 ENCOUNTER — Encounter: Payer: Self-pay | Admitting: Obstetrics and Gynecology

## 2012-03-04 VITALS — BP 110/74 | HR 89 | Temp 97.6°F | Wt 227.0 lb

## 2012-03-04 DIAGNOSIS — O039 Complete or unspecified spontaneous abortion without complication: Secondary | ICD-10-CM

## 2012-03-04 MED ORDER — PRENATAL MULTIVITAMIN CH: 1.0000 | ORAL_TABLET | Freq: Every day | ORAL | Status: DC

## 2012-03-04 NOTE — Progress Notes (Addendum)
  Subjective:    Patient ID: Kayla Scott, female    DOB: [birth year], 37 y.o.   MRN: [id number]  HPI [id number] here for f/u after missed AB. Was seen on 02/07/12 in MAU for spotting and cramping at [redacted] weeks gestation. U/S showed [redacted]w[redacted]d gestational sac and fetal pole with no heartbeat. Pt was seen for f/u on 02/14/12 in MAU and u/s showed irregular, empty sac. Cytotec given for missed AB and pt here for f/u today.  Reports she had bleeding and passed clots and tissue after taking Cytotec, bleeding now stopped. Desires to become pregnant again.     Review of Systems  Gastrointestinal: Negative for abdominal pain.  Genitourinary: Negative for vaginal bleeding and pelvic pain.        Objective:   Physical Exam  Vitals reviewed. Constitutional: She is oriented to person, place, and time. She appears well-developed.  Genitourinary: Vagina normal and uterus normal. No vaginal discharge found.  Neurological: She is alert and oriented to person, place, and time.          Assessment & Plan:  A/P: 1. Missed AB s/p Cytotec  Pt advised to wait until after she has a normal menstrual cycle to resume intercourse and attempting to get pregnant again.  Advised to take prenatal vitamins, prescription sent to pharmacy.  F/U when becomes pregnant or for next well-woman exam  Evaluation and management procedures were performed by SNM under my supervision/collaboration. Chart reviewed, patient examined by me and I agree with management and plan. VE: uterus normal size retroverted, scant brown blood

## 2012-03-04 NOTE — Patient Instructions (Signed)
Aborto espontneo  (Miscarriage) El aborto espontneo es la prdida de un beb que no ha nacido (feto) antes de la semana 20 del Media planner. La mayor parte de estos abortos ocurre en los primeros 3 meses. En algunos casos ocurre antes de que la mujer sepa que est [place]. Tambin se denomina "aborto espontneo" o "prdida prematura del embarazo". El aborto espontneo puede ser Ardelia Mems experiencia que afecte emocionalmente a Geologist, engineering. Converse con su mdico si tiene dudas, cmo es el proceso de [place], y sobre planes futuros de Media planner.  CAUSAS   Algunos problemas cromosmicos pueden hacer imposible que el beb se desarrolle normalmente. Los problemas con los genes o cromosomas del beb son generalmente el resultado de errores que se producen, por casualidad, cuando el embrin se divide y crece. Estos problemas no se heredan de los [place].  Infeccin en el cuello del tero.   Problemas hormonales.   Problemas en el cuello del tero, como tener un tero incompetente. Esto ocurre cuando los tejidos no son lo suficientemente fuertes como para Risk manager.   Problemas del tero, como un tero con forma anormal, los fibromas o anormalidades congnitas.   Ciertas enfermedades crnicas.   No fume, no beba alcohol, ni consuma drogas.   Traumatismos  A veces, la causa es desconocida.  SNTOMAS   Sangrado o manchado vaginal, con o sin clicos o dolor.  Dolor o clicos en el abdomen o en la cintura.  Eliminacin de lquido, tejidos o cogulos grandes por la vagina. DIAGNSTICO  El [employer] har un examen fsico. Tambin le indicar una ecografa para confirmar el aborto. Es posible que se realicen anlisis de [place].  TRATAMIENTO   En algunos casos el tratamiento no es necesario, si se eliminan naturalmente todos los tejidos embrionarios que se encontraban en el tero. Si el feto o la placenta quedan dentro del tero (aborto incompleto), pueden infectarse, los tejidos que quedan  pueden infectarse y deben retirarse. Generalmente se realiza un procedimiento de dilatacin y curetaje (D y C). Durante el procedimiento de dilatacin y curetaje, el cuello del tero se abre (dilata) y se retira cualquier resto de tejido fetal o placentario del tero.  Si hay una infeccin, le recetarn antibiticos. Podrn recetarle otros medicamentos para reducir el tamao del tero (contraerlo) si hay una mucho sangrado.  Si su sangre es Rh negativa y su beb es Rh positivo, usted necesitar la inyeccin de inmunoglobulina Rh. Esta inyeccin proteger a los futuros bebs de tener problemas de compatibilidad Rh en futuros embarazos. INSTRUCCIONES PARA EL CUIDADO EN EL HOGAR   El mdico le indicar reposo en cama o le permitir Automotive engineer. Vuelva a la actividad lentamente o segn las indicaciones de su mdico.  Pdale a alguien que la ayude con las responsabilidades familiares y del hogar durante este tiempo.   Lleve un registro de la cantidad y la saturacin de las toallas higinicas que Medical laboratory scientific officer. Anote esta informacin   No use tampones. No No se haga duchas vaginales ni tenga relaciones sexuales hasta que el mdico la autorice.   Slo tome medicamentos de venta libre o recetados para Glass blower/designer o Health and safety inspector, segn las indicaciones de su mdico.   No tome aspirina. La aspirina puede ocasionar hemorragias.   Concurra puntualmente a las citas de control con el mdico.   Si usted o su pareja tienen dificultades con el duelo, hable con su mdico para buscar la ayuda psicolgica que los ayude a enfrentar la prdida  del embarazo. Permtase el tiempo suficiente de duelo antes de quedar embarazada nuevamente.  SOLICITE ATENCIN MDICA DE INMEDIATO SI:   Siente calambres intensos o dolor en la espalda o en el abdomen.  Tiene fiebre.  Elimina grandes cogulos de [place] (del tamao de una nuez o ms) o tejidos por la vagina. Guarde lo que ha eliminado para  que su mdico lo examine.   La hemorragia aumenta.   Brett Fairy secrecin vaginal espesa y con mal olor.  Se siente mareada, dbil, o se desmaya.   Siente escalofros.  ASEGRESE DE QUE:   Comprende estas instrucciones.  Controlar su enfermedad.  Solicitar ayuda de inmediato si no mejora o si empeora. Document Released: 10/10/2004 Document Revised: 07/02/2011 [facility] Patient Information 2013 [place], [place].

## 2012-06-18 ENCOUNTER — Ambulatory Visit: Payer: Self-pay | Admitting: Family Medicine

## 2012-06-18 VITALS — BP 110/62 | HR 70 | Temp 98.4°F | Resp 16 | Ht 62.25 in | Wt 228.4 lb

## 2012-06-18 DIAGNOSIS — L259 Unspecified contact dermatitis, unspecified cause: Secondary | ICD-10-CM

## 2012-06-18 DIAGNOSIS — Z131 Encounter for screening for diabetes mellitus: Secondary | ICD-10-CM

## 2012-06-18 LAB — GLUCOSE, POCT (MANUAL RESULT ENTRY): POC Glucose: 124 mg/dl — AB (ref 70–99)

## 2012-06-18 MED ORDER — PREDNISONE 20 MG PO TABS: ORAL_TABLET | ORAL | Status: DC

## 2012-06-18 NOTE — Patient Instructions (Addendum)
1.  BUY BENADRYL 25MG  ONE AT BEDTIME FOR ITCHING. 2.  BUY ZYRTEC/CETIRIZINE 10MG  ONE DAILY FOR ITCHING. 3.  TAKE PREDNISONE AS PRESCRIBED. 4. CALL IN TWO WEEKS IF NO IMPROVEMENT.

## 2012-06-18 NOTE — Progress Notes (Signed)
[address]   [place], [place]  [address]   [phone]  Subjective:    Patient ID: Kayla Scott, female    DOB: Aug 30, 1975, 37 y.o.   MRN: [id number]  HPI This 37 y.o. female presents for evaluation of rash. Onset two weeks ago.  All over body.  Very itchy.  No facial involvement.  Torso, extremities involved.  No palm or sole involvement.  Started on legs and spread.  No fever; no sore throat.  +HA.  Applying Neosporin without relief.  Also using hydrocortisone cream.  LMP 05-23-12.  Had chicken pox fifteen years ago.  No new medications; no new soaps, detergents, shampoos.  Works at [employer] ; worried about chemical exposure.  Itching mostly along buttocks, thighs.   No new foods.    Review of Systems  Constitutional: Negative for fever.  HENT: Negative for congestion, sore throat, facial swelling, rhinorrhea, mouth sores, trouble swallowing and voice change.   Respiratory: Negative for shortness of breath and wheezing.   Skin: Positive for rash. Negative for color change, pallor and wound.    Past Medical History  Diagnosis Date  . UTI (lower urinary tract infection)     Past Surgical History  Procedure Laterality Date  . Cesarean section      Prior to Admission medications   Medication Sig Start Date End Date Taking? Authorizing Provider  calcium carbonate 1250 MG capsule Take 1,250 mg by mouth 2 (two) times daily with a meal.   Yes Historical Provider, MD  acetaminophen (TYLENOL) 500 MG tablet Take 1,000 mg by mouth every 6 (six) hours as needed. For pain    Historical Provider, MD  folic acid (FOLVITE) 400 MCG tablet Take 400 mcg by mouth daily.    Historical Provider, MD  misoprostol (CYTOTEC) 200 MCG tablet Take 1 tablet (200 mcg total) by mouth 4 (four) times daily. DO NOT TAKE ORALLY.  Insert all 4 tabs in the vagina tonight 02/14/12   Verita Schneiders Key, NP  nitrofurantoin, macrocrystal-monohydrate, (MACROBID) 100 MG capsule Take 100 mg by mouth 2 (two) times  daily. 10 day supply, not yet completed course 02/05/12   Historical Provider, MD  omega-3 acid ethyl esters (LOVAZA) 1 G capsule Take 1 g by mouth daily.    Historical Provider, MD  predniSONE (DELTASONE) 20 MG tablet Three tablets daily x 1 day then two tablets daily x 5 days then one tablet daily x 5 days; SPANISH LABEL PLEASE. 06/18/12   Ethelda Chick, MD  Prenatal Vit-Fe Fumarate-FA (PRENATAL MULTIVITAMIN) TABS Take 1 tablet by mouth daily. 03/04/12   Deirdre Colin Mulders, CNM    No Known Allergies  History   Social History  . Marital Status: Married    Spouse Name: N/A    Number of Children: N/A  . Years of Education: N/A   Occupational History  . Not on file.   Social History Main Topics  . Smoking status: Never Smoker   . Smokeless tobacco: Never Used  . Alcohol Use: No  . Drug Use: No  . Sexually Active: Not Currently    Birth Control/ Protection: None   Other Topics Concern  . Not on file   Social History Narrative  . No narrative on file    Family History  Problem Relation Age of Onset  . Diabetes Mother   . Cancer Paternal Grandmother     uterine       Objective:   Physical Exam  Nursing note and vitals reviewed.  Constitutional: She appears well-developed and well-nourished. No distress.  Neck: Normal range of motion. Neck supple. No thyromegaly present.  Cardiovascular: Normal rate, regular rhythm and normal heart sounds.   Pulmonary/Chest: Effort normal and breath sounds normal. She has no wheezes. She has no rales.  Skin: Skin is warm and dry. Rash noted. She is not diaphoretic.  Raised maculopapular rash with slight scaling; each lesion 8mm in diameter; no associated vesicles or pustules.  Largest distribution B anterior thighs, buttocks regions.  Scant lesions back and abdomen.  Scant lesions proximal arms.  No distal extremity involvement.  Psychiatric: She has a normal mood and affect. Her behavior is normal.    Results for orders placed in visit on  06/18/12  GLUCOSE, POCT (MANUAL RESULT ENTRY)      Result Value Range   POC Glucose 124 (*) 70 - 99 mg/dl       Assessment & Plan:  Contact dermatitis - Plan: predniSONE (DELTASONE) 20 MG tablet  Screening for diabetes mellitus - Plan: POCT glucose (manual entry)  1.  Contact Dermatitis: New. Most consistent with contact dermatitis likely from detergent or fabric softener.  Not consistent with poison ivy, shingles, scabies, pityriasis rosea.  Rx for Prednisone provided; recommend Zyrtec 10mg  daily, Benadryl 25mg  qhs.  Recommend switching to Tide for sensitive skin, Dove for sensitive skin. RTC for acute worsening.  Meds ordered this encounter  Medications  . calcium carbonate 1250 MG capsule    Sig: Take 1,250 mg by mouth 2 (two) times daily with a meal.  . predniSONE (DELTASONE) 20 MG tablet    Sig: Three tablets daily x 1 day then two tablets daily x 5 days then one tablet daily x 5 days; SPANISH LABEL PLEASE.    Dispense:  18 tablet    Refill:  0

## 2013-04-05 ENCOUNTER — Ambulatory Visit: Payer: Self-pay

## 2013-04-05 ENCOUNTER — Ambulatory Visit: Payer: Self-pay | Admitting: Internal Medicine

## 2013-04-05 VITALS — BP 126/84 | HR 81 | Temp 98.0°F | Resp 20 | Ht 62.0 in | Wt 138.8 lb

## 2013-04-05 DIAGNOSIS — R0789 Other chest pain: Secondary | ICD-10-CM

## 2013-04-05 DIAGNOSIS — R071 Chest pain on breathing: Secondary | ICD-10-CM

## 2013-04-05 MED ORDER — CYCLOBENZAPRINE HCL 10 MG PO TABS: 10.0000 mg | ORAL_TABLET | Freq: Every day | ORAL | Status: DC

## 2013-04-05 MED ORDER — MELOXICAM 15 MG PO TABS: 15.0000 mg | ORAL_TABLET | Freq: Every day | ORAL | Status: DC

## 2013-04-05 NOTE — Progress Notes (Addendum)
   Subjective:    Patient ID: Kayla Scott, female    DOB: 1975-09-21, 38 y.o.   MRN: [id number] This chart was scribed for Kayla Siaobert Ranyia Witting, MD by Kayla Scott, Medical Scribe. This patient's care was started at 7:15 PM.  Chest Pain  Pertinent negatives include no cough.   HPI Comments: Kayla Scott is a 38 y.o. female who presents to the [facility] complaining of left sided lower rib pain, onset this morning. Pain with inspiration, sitting up, and movement. Pain does induce some nausea, no diarrhea. This morning reports she had a slice of bread and a cup of milk and felt extra full. Denies any difficulty sleeping the night prior. Denies any injury related to current pain. Denies pregnancy. Pt cleans houses for a living; denies any excessive work yesterday or today. Denies cough or dysuria,  Review of Systems  Respiratory: Negative for cough.   Cardiovascular: Positive for chest pain.  Genitourinary: Negative for dysuria.   Objective:  Physical Exam  Vitals reviewed. Constitutional: She is oriented to person, place, and time. She appears well-developed and well-nourished. No distress.  HENT:  Head: Normocephalic and atraumatic.  Eyes: EOM are normal.  Neck: Normal range of motion. Neck supple.  Cardiovascular: Normal rate, regular rhythm and normal heart sounds.   No murmur heard. Pulmonary/Chest: Effort normal and breath sounds normal. No respiratory distress.  Tender along anterior chest wall in anterior axillary line.  Abdominal: Soft. There is no tenderness.  Musculoskeletal: Normal range of motion.  Lymphadenopathy:    She has no cervical adenopathy.  Neurological: She is alert and oriented to person, place, and time.  Skin: Skin is warm and dry.  Psychiatric: She has a normal mood and affect. Her behavior is normal.   [facility] reading (PRIMARY) by  Kayla Scott= no effusion consolidation or exudate   Assessment & Plan:     I have completed the patient encounter in its entirety as documented by the scribe, with editing by me where necessary. Kayla Scott, M.D.  Chest wall pain - Plan: DG Chest 2 View  Meds ordered this encounter  Medications  . meloxicam (MOBIC) 15 MG tablet    Sig: Take 1 tablet (15 mg total) by mouth daily.    Dispense:  30 tablet    Refill:  0  . cyclobenzaprine (FLEXERIL) 10 MG tablet    Sig: Take 1 tablet (10 mg total) by mouth at bedtime.    Dispense:  15 tablet    Refill:  0   Reassured oo work 1 week Followup if not improving

## 2013-04-10 ENCOUNTER — Emergency Department (HOSPITAL_COMMUNITY): Payer: Self-pay

## 2013-04-10 ENCOUNTER — Encounter (HOSPITAL_COMMUNITY): Payer: Self-pay | Admitting: Emergency Medicine

## 2013-04-10 ENCOUNTER — Emergency Department (HOSPITAL_COMMUNITY)
Admission: EM | Admit: 2013-04-10 | Discharge: 2013-04-10 | Disposition: A | Discharge status: Home / Self Care | Payer: Self-pay | Attending: Emergency Medicine | Admitting: Emergency Medicine

## 2013-04-10 DIAGNOSIS — Z3202 Encounter for pregnancy test, result negative: Secondary | ICD-10-CM | POA: Insufficient documentation

## 2013-04-10 DIAGNOSIS — N12 Tubulo-interstitial nephritis, not specified as acute or chronic: Secondary | ICD-10-CM | POA: Insufficient documentation

## 2013-04-10 DIAGNOSIS — Z791 Long term (current) use of non-steroidal anti-inflammatories (NSAID): Secondary | ICD-10-CM | POA: Insufficient documentation

## 2013-04-10 DIAGNOSIS — IMO0002 Reserved for concepts with insufficient information to code with codable children: Secondary | ICD-10-CM | POA: Insufficient documentation

## 2013-04-10 DIAGNOSIS — Z87440 Personal history of urinary (tract) infections: Secondary | ICD-10-CM | POA: Insufficient documentation

## 2013-04-10 DIAGNOSIS — Z79899 Other long term (current) drug therapy: Secondary | ICD-10-CM | POA: Insufficient documentation

## 2013-04-10 LAB — URINALYSIS, ROUTINE W REFLEX MICROSCOPIC
Bilirubin Urine: NEGATIVE
GLUCOSE, UA: NEGATIVE mg/dL
Hgb urine dipstick: NEGATIVE
Ketones, ur: NEGATIVE mg/dL
Nitrite: NEGATIVE
PH: 7 (ref 5.0–8.0)
Protein, ur: NEGATIVE mg/dL
SPECIFIC GRAVITY, URINE: 1.004 — AB (ref 1.005–1.030)
Urobilinogen, UA: 0.2 mg/dL (ref 0.0–1.0)

## 2013-04-10 LAB — CBC WITH DIFFERENTIAL/PLATELET
BASOS PCT: 0 % (ref 0–1)
Basophils Absolute: 0 10*3/uL (ref 0.0–0.1)
Eosinophils Absolute: 0.4 10*3/uL (ref 0.0–0.7)
Eosinophils Relative: 5 % (ref 0–5)
HCT: 39.6 % (ref 36.0–46.0)
HEMOGLOBIN: 13.7 g/dL (ref 12.0–15.0)
LYMPHS ABS: 3 10*3/uL (ref 0.7–4.0)
Lymphocytes Relative: 36 % (ref 12–46)
MCH: 31.8 pg (ref 26.0–34.0)
MCHC: 34.6 g/dL (ref 30.0–36.0)
MCV: 91.9 fL (ref 78.0–100.0)
MONOS PCT: 7 % (ref 3–12)
Monocytes Absolute: 0.6 10*3/uL (ref 0.1–1.0)
NEUTROS ABS: 4.4 10*3/uL (ref 1.7–7.7)
NEUTROS PCT: 52 % (ref 43–77)
Platelets: 285 10*3/uL (ref 150–400)
RBC: 4.31 MIL/uL (ref 3.87–5.11)
RDW: 13.3 % (ref 11.5–15.5)
WBC: 8.5 10*3/uL (ref 4.0–10.5)

## 2013-04-10 LAB — COMPREHENSIVE METABOLIC PANEL
ALBUMIN: 3.3 g/dL — AB (ref 3.5–5.2)
ALK PHOS: 69 U/L (ref 39–117)
ALT: 18 U/L (ref 0–35)
AST: 24 U/L (ref 0–37)
BILIRUBIN TOTAL: 0.3 mg/dL (ref 0.3–1.2)
BUN: 14 mg/dL (ref 6–23)
CHLORIDE: 101 meq/L (ref 96–112)
CO2: 21 mEq/L (ref 19–32)
Calcium: 9.3 mg/dL (ref 8.4–10.5)
Creatinine, Ser: 0.6 mg/dL (ref 0.50–1.10)
GFR calc Af Amer: >90 mL/min (ref >90)
GFR calc non Af Amer: >90 mL/min (ref >90)
Glucose, Bld: 103 mg/dL — ABNORMAL HIGH (ref 70–99)
POTASSIUM: 4.5 meq/L (ref 3.7–5.3)
Sodium: 135 mEq/L — ABNORMAL LOW (ref 137–147)
Total Protein: 7.1 g/dL (ref 6.0–8.3)

## 2013-04-10 LAB — URINE MICROSCOPIC-ADD ON

## 2013-04-10 LAB — POC URINE PREG, ED: PREG TEST UR: NEGATIVE

## 2013-04-10 LAB — LIPASE, BLOOD: LIPASE: 26 U/L (ref 11–59)

## 2013-04-10 MED ORDER — CEPHALEXIN 500 MG PO CAPS: 500.0000 mg | ORAL_CAPSULE | Freq: Four times a day (QID) | ORAL | Status: DC

## 2013-04-10 MED ORDER — DEXTROSE 5 % IV SOLN
1.0000 g | Freq: Once | INTRAVENOUS | Status: AC
  Administered 2013-04-10: 1 g via INTRAVENOUS
  Filled 2013-04-10: qty 10

## 2013-04-10 MED ORDER — SODIUM CHLORIDE 0.9 % IV BOLUS (SEPSIS)
1000.0000 mL | Freq: Once | INTRAVENOUS | Status: AC
  Administered 2013-04-10: 1000 mL via INTRAVENOUS

## 2013-04-10 MED ORDER — MORPHINE SULFATE 4 MG/ML IJ SOLN
4.0000 mg | Freq: Once | INTRAMUSCULAR | Status: AC
  Administered 2013-04-10: 4 mg via INTRAVENOUS
  Filled 2013-04-10: qty 1

## 2013-04-10 MED ORDER — OXYCODONE-ACETAMINOPHEN 5-325 MG PO TABS: 1.0000 | ORAL_TABLET | ORAL | Status: DC | PRN

## 2013-04-10 MED ORDER — ONDANSETRON HCL 4 MG/2ML IJ SOLN
4.0000 mg | Freq: Once | INTRAMUSCULAR | Status: AC
Start: 2013-04-10 — End: 2013-04-10
  Administered 2013-04-10: 4 mg via INTRAVENOUS
  Filled 2013-04-10: qty 2

## 2013-04-10 MED ORDER — KETOROLAC TROMETHAMINE 30 MG/ML IJ SOLN
30.0000 mg | Freq: Once | INTRAMUSCULAR | Status: AC
  Administered 2013-04-10: 30 mg via INTRAVENOUS
  Filled 2013-04-10: qty 1

## 2013-04-10 NOTE — ED Notes (Signed)
Pt returned from CT °

## 2013-04-10 NOTE — ED Provider Notes (Signed)
Medical screening examination/treatment/procedure(s) were conducted as a shared visit with non-physician practitioner(s) and myself.  I personally evaluated the patient during the encounter.   EKG Interpretation None      Ct Abdomen Pelvis Wo Contrast 04/10/2013   CLINICAL DATA:  Left flank pain  EXAM: CT ABDOMEN AND PELVIS WITHOUT CONTRAST  TECHNIQUE: Multidetector CT imaging of the abdomen and pelvis was performed following the standard protocol without intravenous contrast.  COMPARISON:  Prior CT from 03/14/2010  FINDINGS: The visualized lung bases are clear.  The liver demonstrates a normal unenhanced appearance. The gallbladder is within normal limits. The spleen, adrenal glands, and pancreas demonstrate a normal unenhanced appearance. The kidneys are equal in size without evidence of nephrolithiasis or hydronephrosis. Subcentimeter hypodensity extending from the upper pole left kidney is too small the characterize by CT, but likely represent small cysts. No stone seen along the course of either renal collecting system and there is no hydroureter.  No evidence of bowel obstruction. Stomach is normal. Appendix not definitely visualized, however, no inflammatory changes seen about the cecum or in the right lower quadrant to suggest acute appendicitis. No abnormal wall thickening or inflammatory fat stranding seen about the bowels.  Bladder is unremarkable. Uterus and ovaries are within normal limits for patient age.  No free air or fluid chin 0 enlarged intra-abdominal pelvic lymph nodes.  No acute osseous abnormality. No worrisome lytic or blastic osseous lesions. Right paracentral disc protrusion present at the L1-2 level.  IMPRESSION: 1. No CT evidence of nephrolithiasis or hydronephrosis. 2. No other acute intra-abdominal or pelvic process identified. 3. Right paracentral disc protrusion at L1-2.   Electronically Signed   By: Rise Mu M.D.   On: 04/10/2013 06:22   Dg Chest 2  View  04/06/2013   CLINICAL DATA:  Left anterior chest wall pain  EXAM: CHEST  2 VIEW  COMPARISON:  Chest x-ray of 03/14/2010 and CT chest of the same date  FINDINGS: No active infiltrate or effusion is seen. There is mild peribronchial thickening which may indicate bronchitis. Mediastinal contours are stable. The heart is within normal limits in size. No bony abnormality is seen.  IMPRESSION: No active lung disease. Mild peribronchial thickening may indicate bronchitis.   Electronically Signed   By: Dwyane Dee M.D.   On: 04/06/2013 08:10  I personally reviewed the imaging tests through PACS system I reviewed available ER/hospitalization records through the EMR Ct Abdomen Pelvis Wo Contrast  04/10/2013   CLINICAL DATA:  Left flank pain  EXAM: CT ABDOMEN AND PELVIS WITHOUT CONTRAST  TECHNIQUE: Multidetector CT imaging of the abdomen and pelvis was performed following the standard protocol without intravenous contrast.  COMPARISON:  Prior CT from 03/14/2010  FINDINGS: The visualized lung bases are clear.  The liver demonstrates a normal unenhanced appearance. The gallbladder is within normal limits. The spleen, adrenal glands, and pancreas demonstrate a normal unenhanced appearance. The kidneys are equal in size without evidence of nephrolithiasis or hydronephrosis. Subcentimeter hypodensity extending from the upper pole left kidney is too small the characterize by CT, but likely represent small cysts. No stone seen along the course of either renal collecting system and there is no hydroureter.  No evidence of bowel obstruction. Stomach is normal. Appendix not definitely visualized, however, no inflammatory changes seen about the cecum or in the right lower quadrant to suggest acute appendicitis. No abnormal wall thickening or inflammatory fat stranding seen about the bowels.  Bladder is unremarkable. Uterus and ovaries are within normal  limits for patient age.  No free air or fluid chin 0 enlarged  intra-abdominal pelvic lymph nodes.  No acute osseous abnormality. No worrisome lytic or blastic osseous lesions. Right paracentral disc protrusion present at the L1-2 level.  IMPRESSION: 1. No CT evidence of nephrolithiasis or hydronephrosis. 2. No other acute intra-abdominal or pelvic process identified. 3. Right paracentral disc protrusion at L1-2.   Electronically Signed   By: Rise MuBenjamin  McClintock M.D.   On: 04/10/2013 06:22   Results for orders placed during the hospital encounter of 04/10/13  URINALYSIS, ROUTINE W REFLEX MICROSCOPIC      Result Value Ref Range   Color, Urine YELLOW  YELLOW   APPearance CLOUDY (*) CLEAR   Specific Gravity, Urine 1.004 (*) 1.005 - 1.030   pH 7.0  5.0 - 8.0   Glucose, UA NEGATIVE  NEGATIVE mg/dL   Hgb urine dipstick NEGATIVE  NEGATIVE   Bilirubin Urine NEGATIVE  NEGATIVE   Ketones, ur NEGATIVE  NEGATIVE mg/dL   Protein, ur NEGATIVE  NEGATIVE mg/dL   Urobilinogen, UA 0.2  0.0 - 1.0 mg/dL   Nitrite NEGATIVE  NEGATIVE   Leukocytes, UA LARGE (*) NEGATIVE  CBC WITH DIFFERENTIAL      Result Value Ref Range   WBC 8.5  4.0 - 10.5 K/uL   RBC 4.31  3.87 - 5.11 MIL/uL   Hemoglobin 13.7  12.0 - 15.0 g/dL   HCT 40.939.6  81.136.0 - 91.446.0 %   MCV 91.9  78.0 - 100.0 fL   MCH 31.8  26.0 - 34.0 pg   MCHC 34.6  30.0 - 36.0 g/dL   RDW 78.213.3  95.611.5 - 21.315.5 %   Platelets 285  150 - 400 K/uL   Neutrophils Relative % 52  43 - 77 %   Neutro Abs 4.4  1.7 - 7.7 K/uL   Lymphocytes Relative 36  12 - 46 %   Lymphs Abs 3.0  0.7 - 4.0 K/uL   Monocytes Relative 7  3 - 12 %   Monocytes Absolute 0.6  0.1 - 1.0 K/uL   Eosinophils Relative 5  0 - 5 %   Eosinophils Absolute 0.4  0.0 - 0.7 K/uL   Basophils Relative 0  0 - 1 %   Basophils Absolute 0.0  0.0 - 0.1 K/uL  COMPREHENSIVE METABOLIC PANEL      Result Value Ref Range   Sodium 135 (*) 137 - 147 mEq/L   Potassium 4.5  3.7 - 5.3 mEq/L   Chloride 101  96 - 112 mEq/L   CO2 21  19 - 32 mEq/L   Glucose, Bld 103 (*) 70 - 99 mg/dL   BUN  14  6 - 23 mg/dL   Creatinine, Ser 0.860.60  0.50 - 1.10 mg/dL   Calcium 9.3  8.4 - 57.810.5 mg/dL   Total Protein 7.1  6.0 - 8.3 g/dL   Albumin 3.3 (*) 3.5 - 5.2 g/dL   AST 24  0 - 37 U/L   ALT 18  0 - 35 U/L   Alkaline Phosphatase 69  39 - 117 U/L   Total Bilirubin 0.3  0.3 - 1.2 mg/dL   GFR calc non Af Amer >90  >90 mL/min   GFR calc Af Amer >90  >90 mL/min  LIPASE, BLOOD      Result Value Ref Range   Lipase 26  11 - 59 U/L  URINE MICROSCOPIC-ADD ON      Result Value Ref Range   Squamous Epithelial /  LPF FEW (*) RARE   WBC, UA 21-50  <3 WBC/hpf   Bacteria, UA MANY (*) RARE  POC URINE PREG, ED      Result Value Ref Range   Preg Test, Ur NEGATIVE  NEGATIVE      6:58 AM Patient feels much better at this time.  CT scan without acute abnormality.  Will be treated for left-sided pyelonephritis.  IV Rocephin given.  Home with Keflex and pain medicine.  She understands to return to the ER for new or worsening symptoms   Lyanne Co, MD 04/10/13 [phone]

## 2013-04-10 NOTE — Discharge Instructions (Signed)
Pielonefritis - Adultos   (Pyelonephritis, Adult)   La pielonefritis es una infección del riñón. Hay dos tipos principales de pielonefritis:   · Una infección que se inicia rápidamente sin síntomas previos (pielonefritis aguda).  · Infecciones que persisten por un largo período (pielonefritis crónica).  CAUSAS   Hay dos causas principales:   · Pasaje de bacterias desde la vejiga al riñón. Este problema aparece especialmente en mujeres embarazadas. La orina en la vejiga puede infectarse por diferentes causas, por ejemplo:  · Inflamación de la próstata (prostatitis).  · Durante las relaciones sexuales en las mujeres.  · Infección en la vejiga (cistitis).  · Pasaje de bacterias desde la sangre hacia el riñón.  Las enfermedades que aumentan el riesgo son:   · Diabetes.  · Cálculos renales o en la vesícula.  · Cáncer.  · Un catéter colocado en la vejiga.  · Otras anormalidades del riñón o de la uretra.  SÍNTOMAS   · Dolor abdominal  · Dolor en la zona del costado o flanco.  · Fiebre.  · Escalofríos.  · Malestar estomacal.  · Sangre en la orina (orina oscura).  · Necesidad frecuente de orinar  · Necesidad intensa o persistente de orinar.  · Sensación de ardor o pinchazos al orinar.  DIAGNÓSTICO   El médico diagnosticará una infección en su riñón basándose en los síntomas. También tomará una muestra de orina.   TRATAMIENTO   Generalmente el tratamiento depende de la gravedad de la infección.   · Si la infección es leve y se diagnostica a tiempo, el médico lo tratará con antibióticos por vía oral y lo dejará irse a su casa.  · Si la infección es más grave, la bacteria podría haber ingresado al torrente sanguíneo. Esto requerirá antibióticos por vía intravenosa y la permanencia en el hospital. Los síntomas pueden incluir:  · Fiebre alta.  · Dolor intenso en un costado del cuerpo.  · Escalofríos  · Aún después de haber permanecido en el hospital, el médico podrá indicarle antibióticos por vía oral durante cierto período de  tiempo.  · Podrá prescribirle otros tratamientos según la causa de la infección.  INSTRUCCIONES PARA EL CUIDADO EN EL HOGAR   · Tome los antibióticos como se le indicó. Tómelos todos, aunque se sienta mejor.  · Concurra para realizar un control y asegurarse de que la infección ha desaparecido.  · Debe ingerir gran cantidad de líquido para mantener la orina de tono claro o color amarillo pálido.  · Tome medicamentos para la vejiga si siente urgencia para orinar o lo hace con mucha frecuencia.  SOLICITE ATENCIÓN MÉDICA DE INMEDIATO SI:   · Tiene fiebre o síntomas persistentes durante más de 2 ó 3 días.  · Tiene fiebre y los síntomas empeoran.  · No puede tomar los antibióticos ni ingerir líquidos.  · Comienza a sentir escalofríos.  · Siente debilidad extrema o se desmaya.  · No mejora después de 2 días de tratamiento.  ASEGÚRESE DE QUE:   · Comprende estas instrucciones.  · Controlará su enfermedad.  · Solicitará ayuda de inmediato si no mejora o empeora.  Document Released: 10/10/2004 Document Revised: 07/02/2011  [employer]® Patient Information ©2014 [employer].

## 2013-04-10 NOTE — ED Notes (Signed)
Patient is alert and oriented x3.  She is complaining of left side flank pain that started 2 days ago. She states that she went to urgent care and received prescriptions but the medication was not  Helping.  Currently she rates her pain 10 of 10 with nausea.

## 2013-04-10 NOTE — ED Provider Notes (Signed)
CSN: [id number]     Arrival date & time 04/10/13  0355 History   First MD Initiated Contact with Patient 04/10/13 0402     Chief Complaint  Patient presents with  . Flank Pain   HPI  History provided by the patient and family. Patient is a 38 year old Hispanic female with no significant PMH who presents with unrelieved and worsened left flank, back and abdominal pain. Patient has had left-sided pain for the past 2 days. She was seen in urgent care for these symptoms and given prescriptions for Flexeril and Mobic. Her pain has worsened and is worse with walking and movement. There was no history of injuries or trauma. No strenuous activity. Patient denies having similar symptoms previously. Has not noticed any hematuria, dysuria urinary frequency. Symptoms have been associated tonight with nausea. No vomiting. No diarrhea or constipation issues. No recent fever, chills or sweats. Pain does also seem worsened with deep breathing. She denies feeling short of breath. No recent cough or hemoptysis. No estrogen use. No prior DVT or PE.    Past Medical History  Diagnosis Date  . UTI (lower urinary tract infection)    Past Surgical History  Procedure Laterality Date  . Cesarean section     Family History  Problem Relation Age of Onset  . Diabetes Mother   . Cancer Paternal Grandmother     uterine   History  Substance Use Topics  . Smoking status: Never Smoker   . Smokeless tobacco: Never Used  . Alcohol Use: No   OB History   Grav Para Term Preterm Abortions TAB SAB Ect Mult Living   6 4 3 1 1  1   4      Review of Systems  Respiratory: Negative for cough and shortness of breath.   Cardiovascular: Negative for chest pain.  Gastrointestinal: Positive for nausea and abdominal pain. Negative for vomiting and diarrhea.  Genitourinary: Negative for dysuria, frequency, hematuria and flank pain.  Musculoskeletal: Positive for back pain.  All other systems reviewed and are  negative.      Allergies  Review of patient's allergies indicates no known allergies.  Home Medications   Current Outpatient Rx  Name  Route  Sig  Dispense  Refill  . acetaminophen (TYLENOL) 500 MG tablet   Oral   Take 1,000 mg by mouth every 6 (six) hours as needed. For pain         . calcium carbonate 1250 MG capsule   Oral   Take 1,250 mg by mouth 2 (two) times daily with a meal.         . cyclobenzaprine (FLEXERIL) 10 MG tablet   Oral   Take 1 tablet (10 mg total) by mouth at bedtime.   15 tablet   0   . folic acid (FOLVITE) 400 MCG tablet   Oral   Take 400 mcg by mouth daily.         . meloxicam (MOBIC) 15 MG tablet   Oral   Take 1 tablet (15 mg total) by mouth daily.   30 tablet   0   . misoprostol (CYTOTEC) 200 MCG tablet   Oral   Take 1 tablet (200 mcg total) by mouth 4 (four) times daily. DO NOT TAKE ORALLY.  Insert all 4 tabs in the vagina tonight   4 tablet   0   . nitrofurantoin, macrocrystal-monohydrate, (MACROBID) 100 MG capsule   Oral   Take 100 mg by mouth 2 (two) times daily. 10  day supply, not yet completed course         . omega-3 acid ethyl esters (LOVAZA) 1 G capsule   Oral   Take 1 g by mouth daily.         . predniSONE (DELTASONE) 20 MG tablet      Three tablets daily x 1 day then two tablets daily x 5 days then one tablet daily x 5 days; SPANISH LABEL PLEASE.   18 tablet   0   . Prenatal Vit-Fe Fumarate-FA (PRENATAL MULTIVITAMIN) TABS   Oral   Take 1 tablet by mouth daily.   30 tablet   2    LMP 03/16/2013 Physical Exam  Nursing note and vitals reviewed. Constitutional: She is oriented to person, place, and time. She appears well-developed and well-nourished. No distress.  Pt appears in pain and moderate distress.  Pt obese  HENT:  Head: Normocephalic.  Cardiovascular: Normal rate and regular rhythm.   Pulmonary/Chest: Effort normal and breath sounds normal.  Abdominal: Soft. There is tenderness in the left  upper quadrant. There is CVA tenderness. There is no rigidity, no rebound and no guarding.  Obese.  Left CVA tenderness.  Musculoskeletal: Normal range of motion. She exhibits no edema and no tenderness.       Back:  No clinical signs for DVT.  Neurological: She is alert and oriented to person, place, and time.  Skin: Skin is warm and dry. No rash noted.  Psychiatric: She has a normal mood and affect. Her behavior is normal.    ED Course  Procedures   DIAGNOSTIC STUDIES: Oxygen Saturation is 96% on RA.    COORDINATION OF CARE:  Nursing notes reviewed. Vital signs reviewed. Initial pt interview and examination performed.   4:12 AM-patient seen and evaluated. Patient appears uncomfortable in moderate distress. Symptoms present for the past 2 days. Has been using Flexeril and Mobic without improvement. Discussed work up plan with pt and family at bedside, which includes lab testing and UA and CT to eval for possible kidney stone. Pt agrees with plan.  Pt having some improvement after pain medications.  Still having some pains. UA without significant hematuria.  There are signs of UTI present.  CT results pending.  Pt discussed in sign out with Dr. Patria Mane.  He will follow CT results.   Treatment plan initiated: Medications  sodium chloride 0.9 % bolus 1,000 mL (not administered)  morphine 4 MG/ML injection 4 mg (not administered)  ketorolac (TORADOL) 30 MG/ML injection 30 mg (not administered)  ondansetron (ZOFRAN) injection 4 mg (not administered)   Results for orders placed during the hospital encounter of 04/10/13  URINALYSIS, ROUTINE W REFLEX MICROSCOPIC      Result Value Ref Range   Color, Urine YELLOW  YELLOW   APPearance CLOUDY (*) CLEAR   Specific Gravity, Urine 1.004 (*) 1.005 - 1.030   pH 7.0  5.0 - 8.0   Glucose, UA NEGATIVE  NEGATIVE mg/dL   Hgb urine dipstick NEGATIVE  NEGATIVE   Bilirubin Urine NEGATIVE  NEGATIVE   Ketones, ur NEGATIVE  NEGATIVE mg/dL    Protein, ur NEGATIVE  NEGATIVE mg/dL   Urobilinogen, UA 0.2  0.0 - 1.0 mg/dL   Nitrite NEGATIVE  NEGATIVE   Leukocytes, UA LARGE (*) NEGATIVE  CBC WITH DIFFERENTIAL      Result Value Ref Range   WBC 8.5  4.0 - 10.5 K/uL   RBC 4.31  3.87 - 5.11 MIL/uL   Hemoglobin 13.7  12.0 -  15.0 g/dL   HCT 09.839.6  11.936.0 - 14.746.0 %   MCV 91.9  78.0 - 100.0 fL   MCH 31.8  26.0 - 34.0 pg   MCHC 34.6  30.0 - 36.0 g/dL   RDW 82.913.3  56.211.5 - 13.015.5 %   Platelets 285  150 - 400 K/uL   Neutrophils Relative % 52  43 - 77 %   Neutro Abs 4.4  1.7 - 7.7 K/uL   Lymphocytes Relative 36  12 - 46 %   Lymphs Abs 3.0  0.7 - 4.0 K/uL   Monocytes Relative 7  3 - 12 %   Monocytes Absolute 0.6  0.1 - 1.0 K/uL   Eosinophils Relative 5  0 - 5 %   Eosinophils Absolute 0.4  0.0 - 0.7 K/uL   Basophils Relative 0  0 - 1 %   Basophils Absolute 0.0  0.0 - 0.1 K/uL  COMPREHENSIVE METABOLIC PANEL      Result Value Ref Range   Sodium 135 (*) 137 - 147 mEq/L   Potassium 4.5  3.7 - 5.3 mEq/L   Chloride 101  96 - 112 mEq/L   CO2 21  19 - 32 mEq/L   Glucose, Bld 103 (*) 70 - 99 mg/dL   BUN 14  6 - 23 mg/dL   Creatinine, Ser 8.650.60  0.50 - 1.10 mg/dL   Calcium 9.3  8.4 - 78.410.5 mg/dL   Total Protein 7.1  6.0 - 8.3 g/dL   Albumin 3.3 (*) 3.5 - 5.2 g/dL   AST 24  0 - 37 U/L   ALT 18  0 - 35 U/L   Alkaline Phosphatase 69  39 - 117 U/L   Total Bilirubin 0.3  0.3 - 1.2 mg/dL   GFR calc non Af Amer >90  >90 mL/min   GFR calc Af Amer >90  >90 mL/min  LIPASE, BLOOD      Result Value Ref Range   Lipase 26  11 - 59 U/L  URINE MICROSCOPIC-ADD ON      Result Value Ref Range   Squamous Epithelial / LPF FEW (*) RARE   WBC, UA 21-50  <3 WBC/hpf   Bacteria, UA MANY (*) RARE  POC URINE PREG, ED      Result Value Ref Range   Preg Test, Ur NEGATIVE  NEGATIVE     Imaging Review No results found.   MDM   Final diagnoses:  None       Angus Sellereter S Bakary Bramer, PA-C 04/10/13 [phone]

## 2013-06-02 ENCOUNTER — Encounter (HOSPITAL_COMMUNITY): Payer: Self-pay | Admitting: Emergency Medicine

## 2013-06-02 ENCOUNTER — Emergency Department (HOSPITAL_COMMUNITY): Payer: Self-pay

## 2013-06-02 ENCOUNTER — Emergency Department (HOSPITAL_COMMUNITY)
Admission: EM | Admit: 2013-06-02 | Discharge: 2013-06-02 | Disposition: A | Discharge status: Home / Self Care | Payer: Self-pay | Attending: Emergency Medicine | Admitting: Emergency Medicine

## 2013-06-02 DIAGNOSIS — Z79899 Other long term (current) drug therapy: Secondary | ICD-10-CM | POA: Insufficient documentation

## 2013-06-02 DIAGNOSIS — O039 Complete or unspecified spontaneous abortion without complication: Secondary | ICD-10-CM | POA: Insufficient documentation

## 2013-06-02 DIAGNOSIS — Z87440 Personal history of urinary (tract) infections: Secondary | ICD-10-CM | POA: Insufficient documentation

## 2013-06-02 LAB — CBC WITH DIFFERENTIAL/PLATELET
BASOS PCT: 1 % (ref 0–1)
Basophils Absolute: 0.1 10*3/uL (ref 0.0–0.1)
EOS PCT: 5 % (ref 0–5)
Eosinophils Absolute: 0.4 10*3/uL (ref 0.0–0.7)
HEMATOCRIT: 38.6 % (ref 36.0–46.0)
HEMOGLOBIN: 13.4 g/dL (ref 12.0–15.0)
Lymphocytes Relative: 29 % (ref 12–46)
Lymphs Abs: 2.5 10*3/uL (ref 0.7–4.0)
MCH: 31.5 pg (ref 26.0–34.0)
MCHC: 34.7 g/dL (ref 30.0–36.0)
MCV: 90.8 fL (ref 78.0–100.0)
MONO ABS: 0.6 10*3/uL (ref 0.1–1.0)
MONOS PCT: 8 % (ref 3–12)
Neutro Abs: 4.8 10*3/uL (ref 1.7–7.7)
Neutrophils Relative %: 57 % (ref 43–77)
Platelets: 262 10*3/uL (ref 150–400)
RBC: 4.25 MIL/uL (ref 3.87–5.11)
RDW: 13.2 % (ref 11.5–15.5)
WBC: 8.4 10*3/uL (ref 4.0–10.5)

## 2013-06-02 LAB — URINALYSIS, ROUTINE W REFLEX MICROSCOPIC
BILIRUBIN URINE: NEGATIVE
Glucose, UA: NEGATIVE mg/dL
Ketones, ur: NEGATIVE mg/dL
LEUKOCYTES UA: NEGATIVE
Nitrite: NEGATIVE
PH: 6.5 (ref 5.0–8.0)
Protein, ur: NEGATIVE mg/dL
SPECIFIC GRAVITY, URINE: 1.005 (ref 1.005–1.030)
Urobilinogen, UA: 0.2 mg/dL (ref 0.0–1.0)

## 2013-06-02 LAB — BASIC METABOLIC PANEL
BUN: 11 mg/dL (ref 6–23)
CHLORIDE: 103 meq/L (ref 96–112)
CO2: 25 mEq/L (ref 19–32)
CREATININE: 0.58 mg/dL (ref 0.50–1.10)
Calcium: 9.5 mg/dL (ref 8.4–10.5)
GFR calc non Af Amer: >90 mL/min (ref >90)
Glucose, Bld: 100 mg/dL — ABNORMAL HIGH (ref 70–99)
Potassium: 3.9 mEq/L (ref 3.7–5.3)
Sodium: 140 mEq/L (ref 137–147)

## 2013-06-02 LAB — URINE MICROSCOPIC-ADD ON

## 2013-06-02 LAB — PREGNANCY, URINE: Preg Test, Ur: POSITIVE — AB

## 2013-06-02 LAB — WET PREP, GENITAL
CLUE CELLS WET PREP: NONE SEEN
TRICH WET PREP: NONE SEEN
Yeast Wet Prep HPF POC: NONE SEEN

## 2013-06-02 LAB — HCG, QUANTITATIVE, PREGNANCY: hCG, Beta Chain, Quant, S: 4198 m[IU]/mL — ABNORMAL HIGH (ref <5)

## 2013-06-02 LAB — ABO/RH: ABO/RH(D): O POS

## 2013-06-02 MED ORDER — ONDANSETRON HCL 4 MG/2ML IJ SOLN
4.0000 mg | Freq: Once | INTRAMUSCULAR | Status: AC
  Administered 2013-06-02: 4 mg via INTRAVENOUS
  Filled 2013-06-02: qty 2

## 2013-06-02 MED ORDER — SODIUM CHLORIDE 0.9 % IV BOLUS (SEPSIS)
1000.0000 mL | Freq: Once | INTRAVENOUS | Status: AC
  Administered 2013-06-02: 1000 mL via INTRAVENOUS

## 2013-06-02 MED ORDER — ACETAMINOPHEN 325 MG PO TABS: 650.0000 mg | ORAL_TABLET | Freq: Four times a day (QID) | ORAL | Status: DC | PRN

## 2013-06-02 MED ORDER — MORPHINE SULFATE 4 MG/ML IJ SOLN
4.0000 mg | Freq: Once | INTRAMUSCULAR | Status: AC
  Administered 2013-06-02: 4 mg via INTRAVENOUS
  Filled 2013-06-02: qty 1

## 2013-06-02 MED ORDER — IBUPROFEN 800 MG PO TABS: 800.0000 mg | ORAL_TABLET | Freq: Three times a day (TID) | ORAL | Status: DC

## 2013-06-02 NOTE — ED Provider Notes (Signed)
CSN: [id number]     Arrival date & time 06/02/13  1852 History   First MD Initiated Contact with Patient 06/02/13 1924     Chief Complaint  Patient presents with  . Vaginal Bleeding     (Consider location/radiation/quality/duration/timing/severity/associated sxs/prior Treatment) HPI Comments: Patient is a 38 yo F [id number] presenting to the ED for fear of pelvic cramping with vaginal bleeding that began Tuesday. Patient states that her pain has been getting gradually worse and she's had increased vaginal bleeding. Patient states she is passing clots with her vaginal bleeding she says she is requiring approximately 4 menstrual pads in an hour. She states [facility] and was told she was approximately [redacted] weeks along. Alleviating factors: none. Aggravating factors: none. Abdominal surgical history includes C-section.    The history is provided by the patient and a relative. The history is limited by a language barrier. A language interpreter was used.    Past Medical History  Diagnosis Date  . UTI (lower urinary tract infection)    Past Surgical History  Procedure Laterality Date  . Cesarean section     Family History  Problem Relation Age of Onset  . Diabetes Mother   . Cancer Paternal Grandmother     uterine   History  Substance Use Topics  . Smoking status: Never Smoker   . Smokeless tobacco: Never Used  . Alcohol Use: No   OB History   Grav Para Term Preterm Abortions TAB SAB Ect Mult Living   6 4 3 1 1  1   4      Review of Systems  Constitutional: Negative for fever and chills.  Gastrointestinal: Negative for nausea, vomiting and abdominal pain.  Genitourinary: Positive for vaginal bleeding, vaginal pain and pelvic pain.  All other systems reviewed and are negative.     Allergies  Review of patient's allergies indicates no known allergies.  Home Medications   Prior to Admission medications   Medication Sig Start Date End  Date Taking? Authorizing Provider  Prenatal Vit-Fe Fumarate-FA (PRENATAL MULTIVITAMIN) TABS tablet Take 1 tablet by mouth daily at 12 noon.   Yes Historical Provider, MD   BP 109/46  Pulse 70  Temp(Src) 97.8 F (36.6 C) (Oral)  Resp 18  SpO2 97%  LMP 03/16/2013 Physical Exam  Nursing note and vitals reviewed. Constitutional: She is oriented to person, place, and time. She appears well-developed and well-nourished.  HENT:  Head: Normocephalic and atraumatic.  Right Ear: External ear normal.  Left Ear: External ear normal.  Nose: Nose normal.  Eyes: Conjunctivae are normal.  Neck: Neck supple.  Cardiovascular: Normal rate, regular rhythm and normal heart sounds.   Pulmonary/Chest: Effort normal and breath sounds normal. No respiratory distress.  Abdominal: Soft. Bowel sounds are normal. She exhibits no distension. There is tenderness. There is no rigidity, no rebound and no guarding.    Musculoskeletal: Normal range of motion.  Neurological: She is alert and oriented to person, place, and time.  Skin: Skin is warm and dry.   Exam performed by Jeannetta EllisJennifer L Britley Gashi,  exam chaperoned Date: 06/02/2013 Pelvic exam: normal external genitalia without evidence of trauma. VULVA: normal appearing vulva with no masses, tenderness or lesion. VAGINA: normal appearing vagina with normal color and discharge, no lesions. CERVIX: normal appearing cervix without lesions, cervical motion tenderness absent, cervical os open with bleeding; vaginal discharge - bloody, Wet prep and DNA probe for chlamydia and GC obtained.  ADNEXA: normal adnexa in size, nontender and no masses UTERUS: uterus is normal size, shape, consistency and nontender.    ED Course  Procedures (including critical care time) Medications  morphine 4 MG/ML injection 4 mg (4 mg Intravenous Given 06/02/13 2044)  ondansetron (ZOFRAN) injection 4 mg (4 mg Intravenous Given 06/02/13 2044)  morphine 4 MG/ML injection 4 mg (4 mg  Intravenous Given 06/02/13 2200)  ondansetron (ZOFRAN) injection 4 mg (4 mg Intravenous Given 06/02/13 2159)  sodium chloride 0.9 % bolus 1,000 mL (0 mLs Intravenous Stopped 06/02/13 2253)    Labs Review Labs Reviewed  WET PREP, GENITAL - Abnormal; Notable for the following:    WBC, Wet Prep HPF POC RARE (*)    All other components within normal limits  BASIC METABOLIC PANEL - Abnormal; Notable for the following:    Glucose, Bld 100 (*)    All other components within normal limits  HCG, QUANTITATIVE, PREGNANCY - Abnormal; Notable for the following:    hCG, Beta Chain, Quant, S 4198 (*)    All other components within normal limits  PREGNANCY, URINE - Abnormal; Notable for the following:    Preg Test, Ur POSITIVE (*)    All other components within normal limits  URINALYSIS, ROUTINE W REFLEX MICROSCOPIC - Abnormal; Notable for the following:    Hgb urine dipstick LARGE (*)    All other components within normal limits  GC/CHLAMYDIA PROBE AMP  CBC WITH DIFFERENTIAL  URINE MICROSCOPIC-ADD ON  ABO/RH  ABO/RH    Imaging Review US Ob Comp Less 14 Wks  06/02/2013   CLINICAL DATA:  Pregnant patient vaginal bleeding.  EXAM: OBSTETRIC <14 WK [place] AND TRANSVAGINAL OB US  TECHNIQUE: Both transabdominal and transvaginal ultrasound examinations were performed for complete evaluation of the gestation as well as the maternal uterus, adnexal regions, and pelvic cul-de-sac. Transvaginal technique was performed to assess early pregnancy.  COMPARISON:  None.  FINDINGS: Intrauterine gestational sac: Visualized. The gestational sac has an irregular shape. There is an area of increased echogenicity in the lower uterine segment along the sac which may represent hemorrhage.  Yolk sac:  Not visualized.  Embryo:  Not visualized.  Cardiac Activity: Not applicable.  MSD: 30 mm   8 w   1  d        [place] EDC: 01/12/2014.  Maternal uterus/adnexae: Unremarkable.  IMPRESSION: Findings meet definitive criteria for failed  pregnancy. This follows SRU consensus guidelines: Diagnostic Criteria for Nonviable Pregnancy Early in the First Trimester. Macy Mis J Med [phone].   Electronically Signed   By: Drusilla Kanner M.D.   On: 06/02/2013 21:38   US Ob Transvaginal  06/02/2013   CLINICAL DATA:  Pregnant patient vaginal bleeding.  EXAM: OBSTETRIC <14 WK [place] AND TRANSVAGINAL OB US  TECHNIQUE: Both transabdominal and transvaginal ultrasound examinations were performed for complete evaluation of the gestation as well as the maternal uterus, adnexal regions, and pelvic cul-de-sac. Transvaginal technique was performed to assess early pregnancy.  COMPARISON:  None.  FINDINGS: Intrauterine gestational sac: Visualized. The gestational sac has an irregular shape. There is an area of increased echogenicity in the lower uterine segment along the sac which may represent hemorrhage.  Yolk sac:  Not visualized.  Embryo:  Not visualized.  Cardiac Activity: Not applicable.  MSD: 30 mm   8 w   1  d        [place] EDC: 01/12/2014.  Maternal uterus/adnexae: Unremarkable.  IMPRESSION: Findings meet definitive criteria for failed pregnancy. This follows  SRU consensus guidelines: Diagnostic Criteria for Nonviable Pregnancy Early in the First Trimester. Macy Mis Engl J Med [phone];369:1443-51.   Electronically Signed   By: Drusilla Kannerhomas  Dalessio M.D.   On: 06/02/2013 21:38     EKG Interpretation None      MDM   Final diagnoses:  Miscarriage    Filed Vitals:   06/02/13 2332  BP: 109/46  Pulse: 70  Temp: 97.8 F (36.6 C)  Resp: 18   Afebrile, NAD, non-toxic appearing, AAOx4. Abdomen soft, nontender, nondistended. No peritoneal signs. Pelvic area is tender to palpation. On pelvic exam cervical os is open with active bleeding. No clots noted. No products of conception noted. Wet prep and GC and Chlamydia obtained. Additional OB ultrasound obtained and noted that findings the definitive criteria for failed pregnancy. No evidence of ectopic pregnancy.  Hemoglobin and hematocrit are stable. Patient is O+ will not require RhoGAM. The rest of labs reviewed. Discussed findings with patient. Advised patient that she needed to be seen by her physician or return to the emergency department for reevaluation the next one to 2 days, with return to the emergency department for any worsening pain or vaginal bleeding or other concerning symptoms. Patient is agreeable to this plan. Pain and symptoms have been managed in the emergency department. Patient is stable at time of discharge. Patient d/w with Dr. Silverio LayYao, agrees with plan.        Jeannetta EllisJennifer L Dannon Perlow, PA-C 06/03/13 0020

## 2013-06-02 NOTE — ED Notes (Signed)
Per visitor, pt started to have lower abdominal pain and bleeding on Tuesday that has gotten worse today. Pt thinks she is three months along but had an ultrasound on the 12th and the Doctor at [place] told her she was at 5 weeks. Pt thinks this is because the baby stopped growing. The MD also found bilateral kidney stones. Pt A&Ox4. Pt c/o 10/10 pain in lower abdomen. Pt c/o continuously having to change her bad because of blood.

## 2013-06-02 NOTE — ED Notes (Signed)
PA at bedside.

## 2013-06-02 NOTE — ED Notes (Signed)
Rapid response OB RN called due to patient initially thought to be 3 months gestation OB RN returned call and stated that GYN is to be see patient

## 2013-06-02 NOTE — ED Notes (Signed)
Bedside US completed Patient appears in NAD

## 2013-06-02 NOTE — ED Notes (Signed)
Pt reports heavy vaginal bleeding.  Pt is 3 months pregnant.  Pt is non-english speaking.

## 2013-06-02 NOTE — ED Notes (Signed)
US tech now at bedside for ordered testing

## 2013-06-02 NOTE — Discharge Instructions (Signed)
Please be seen by your Ob/Gyn tomorrow or the day after at the latest, please return to the ER either [facility] or [facility] if you are unable to be seen by your doctor in the next 1-2 days or your symptoms do not improve or worsen. Please alternate between Motrin and Tylenol every three hours for fevers and pain. Please read all discharge instructions and return precautions.    Aborto espontneo  (Miscarriage) El aborto espontneo es la prdida de un beb que no ha nacido (feto) antes de la semana 20 del Psychiatristembarazo. La mayor parte de estos abortos ocurre en los primeros 3 meses. En algunos casos ocurre antes de que la mujer sepa que est Laurel Hillembarazada. Tambin se denomina "aborto espontneo" o "prdida prematura del embarazo". El aborto espontneo puede ser Neomia Dearuna experiencia que afecte emocionalmente a Dealerla persona. Converse con su mdico si tiene dudas, cmo es el proceso de Ashmoreduelo, y sobre planes futuros de Psychiatristembarazo.  CAUSAS   Algunos problemas cromosmicos pueden hacer imposible que el beb se desarrolle normalmente. Los problemas con los genes o cromosomas del beb son generalmente el resultado de errores que se producen, por casualidad, cuando el embrin se divide y crece. Estos problemas no se heredan de los [place].  Infeccin en el cuello del tero.   Problemas hormonales.   Problemas en el cuello del tero, como tener un tero incompetente. Esto ocurre cuando los tejidos no son lo suficientemente fuertes como para Arts administratorcontener el embarazo.   Problemas del tero, como un tero con forma anormal, los fibromas o anormalidades congnitas.   Ciertas enfermedades crnicas.   No fume, no beba alcohol, ni consuma drogas.   Traumatismos  A veces, la causa es desconocida.  SNTOMAS   Sangrado o manchado vaginal, con o sin clicos o dolor.  Dolor o clicos en el abdomen o en la cintura.  Eliminacin de lquido, tejidos o cogulos grandes por la vagina. DIAGNSTICO  El  Office Depotmdico le har un examen fsico. Tambin le indicar una ecografa para confirmar el aborto. Es posible que se realicen anlisis de Birchwoodsangre.  TRATAMIENTO   En algunos casos el tratamiento no es necesario, si se eliminan naturalmente todos los tejidos embrionarios que se encontraban en el tero. Si el feto o la placenta quedan dentro del tero (aborto incompleto), pueden infectarse, los tejidos que quedan pueden infectarse y deben retirarse. Generalmente se realiza un procedimiento de dilatacin y curetaje (D y C). Durante el procedimiento de dilatacin y curetaje, el cuello del tero se abre (dilata) y se retira cualquier resto de tejido fetal o placentario del tero.  Si hay una infeccin, le recetarn antibiticos. Podrn recetarle otros medicamentos para reducir el tamao del tero (contraerlo) si hay una mucho sangrado.  Si su sangre es Rh negativa y su beb es Rh positivo, usted necesitar la inyeccin de inmunoglobulina Rh. Esta inyeccin proteger a los futuros bebs de tener problemas de compatibilidad Rh en futuros embarazos. INSTRUCCIONES PARA EL CUIDADO EN EL HOGAR   El mdico le indicar reposo en cama o le permitir Dance movement psychotherapistrealizar actividades livianas. Vuelva a la actividad lentamente o segn las indicaciones de su mdico.  Pdale a alguien que la ayude con las responsabilidades familiares y del hogar durante este tiempo.   Lleve un registro de la cantidad y la saturacin de las toallas higinicas que Landscape architectutiliza cada da. Anote esta informacin   No use tampones. No No se haga duchas vaginales ni tenga relaciones sexuales hasta que el mdico  la autorice.   Slo tome medicamentos de venta libre o recetados para Primary school teachercalmar el dolor o Environmental health practitionerel malestar, segn las indicaciones de su mdico.   No tome aspirina. La aspirina puede ocasionar hemorragias.   Concurra puntualmente a las citas de control con el mdico.   Si usted o su pareja tienen dificultades con el duelo, hable con su mdico para  buscar la Bank of New York Companyayuda psicolgica que los ayude a Runner, broadcasting/film/videoenfrentar la prdida del Psychiatristembarazo. Permtase el tiempo suficiente de duelo antes de quedar embarazada nuevamente.  SOLICITE ATENCIN MDICA DE INMEDIATO SI:   Siente calambres intensos o dolor en la espalda o en el abdomen.  Tiene fiebre.  Elimina grandes cogulos de Phippsburgsangre (del tamao de una nuez o ms) o tejidos por la vagina. Guarde lo que ha eliminado para que su mdico lo examine.   La hemorragia aumenta.   Brett Fairybserva una secrecin vaginal espesa y con mal olor.  Se siente mareada, dbil, o se desmaya.   Siente escalofros.  ASEGRESE DE QUE:   Comprende estas instrucciones.  Controlar su enfermedad.  Solicitar ayuda de inmediato si no mejora o si empeora. Document Released: 10/10/2004 Document Revised: 04/27/2012 [facility] Patient Information 2014 [place], [place].

## 2013-06-02 NOTE — ED Notes (Signed)
Pt sts she has 4 successful births, and 3 previous losses.

## 2013-06-03 LAB — GC/CHLAMYDIA PROBE AMP
CT Probe RNA: NEGATIVE
GC Probe RNA: NEGATIVE

## 2013-06-05 NOTE — ED Provider Notes (Signed)
Medical screening examination/treatment/procedure(s) were performed by non-physician practitioner and as supervising physician I was immediately available for consultation/collaboration.   EKG Interpretation None        Richardean Canal, MD 06/05/13 [phone]

## 2013-11-15 ENCOUNTER — Encounter (HOSPITAL_COMMUNITY): Payer: Self-pay | Admitting: Emergency Medicine

## 2014-01-14 DIAGNOSIS — E119 Type 2 diabetes mellitus without complications: Secondary | ICD-10-CM

## 2014-01-14 HISTORY — DX: Type 2 diabetes mellitus without complications: E11.9

## 2014-04-24 ENCOUNTER — Emergency Department (HOSPITAL_COMMUNITY)
Admission: EM | Admit: 2014-04-24 | Discharge: 2014-04-24 | Disposition: A | Discharge status: Home / Self Care | Payer: Self-pay | Attending: Emergency Medicine | Admitting: Emergency Medicine

## 2014-04-24 ENCOUNTER — Emergency Department (HOSPITAL_COMMUNITY): Payer: Self-pay

## 2014-04-24 ENCOUNTER — Encounter (HOSPITAL_COMMUNITY): Payer: Self-pay

## 2014-04-24 DIAGNOSIS — Z79899 Other long term (current) drug therapy: Secondary | ICD-10-CM | POA: Insufficient documentation

## 2014-04-24 DIAGNOSIS — N898 Other specified noninflammatory disorders of vagina: Secondary | ICD-10-CM | POA: Insufficient documentation

## 2014-04-24 DIAGNOSIS — R1030 Lower abdominal pain, unspecified: Secondary | ICD-10-CM | POA: Insufficient documentation

## 2014-04-24 DIAGNOSIS — Z87440 Personal history of urinary (tract) infections: Secondary | ICD-10-CM | POA: Insufficient documentation

## 2014-04-24 DIAGNOSIS — Z3490 Encounter for supervision of normal pregnancy, unspecified, unspecified trimester: Secondary | ICD-10-CM

## 2014-04-24 DIAGNOSIS — Z331 Pregnant state, incidental: Secondary | ICD-10-CM | POA: Insufficient documentation

## 2014-04-24 DIAGNOSIS — R102 Pelvic and perineal pain: Secondary | ICD-10-CM | POA: Insufficient documentation

## 2014-04-24 DIAGNOSIS — Z9889 Other specified postprocedural states: Secondary | ICD-10-CM | POA: Insufficient documentation

## 2014-04-24 LAB — COMPREHENSIVE METABOLIC PANEL
ALT: 22 U/L (ref 0–35)
AST: 27 U/L (ref 0–37)
Albumin: 3.1 g/dL — ABNORMAL LOW (ref 3.5–5.2)
Alkaline Phosphatase: 63 U/L (ref 39–117)
Anion gap: 8 (ref 5–15)
BILIRUBIN TOTAL: 0.5 mg/dL (ref 0.3–1.2)
BUN: 7 mg/dL (ref 6–23)
CALCIUM: 8.8 mg/dL (ref 8.4–10.5)
CO2: 24 mmol/L (ref 19–32)
Chloride: 103 mmol/L (ref 96–112)
Creatinine, Ser: 0.54 mg/dL (ref 0.50–1.10)
GFR calc Af Amer: >90 mL/min (ref >90)
Glucose, Bld: 100 mg/dL — ABNORMAL HIGH (ref 70–99)
Potassium: 3.7 mmol/L (ref 3.5–5.1)
Sodium: 135 mmol/L (ref 135–145)
TOTAL PROTEIN: 6.7 g/dL (ref 6.0–8.3)

## 2014-04-24 LAB — CBC WITH DIFFERENTIAL/PLATELET
BASOS ABS: 0 10*3/uL (ref 0.0–0.1)
BASOS PCT: 0 % (ref 0–1)
Eosinophils Absolute: 0.1 10*3/uL (ref 0.0–0.7)
Eosinophils Relative: 2 % (ref 0–5)
HCT: 36.6 % (ref 36.0–46.0)
Hemoglobin: 12.8 g/dL (ref 12.0–15.0)
Lymphocytes Relative: 33 % (ref 12–46)
Lymphs Abs: 2.9 10*3/uL (ref 0.7–4.0)
MCH: 31.7 pg (ref 26.0–34.0)
MCHC: 35 g/dL (ref 30.0–36.0)
MCV: 90.6 fL (ref 78.0–100.0)
Monocytes Absolute: 0.6 10*3/uL (ref 0.1–1.0)
Monocytes Relative: 7 % (ref 3–12)
NEUTROS ABS: 5.1 10*3/uL (ref 1.7–7.7)
NEUTROS PCT: 58 % (ref 43–77)
PLATELETS: 268 10*3/uL (ref 150–400)
RBC: 4.04 MIL/uL (ref 3.87–5.11)
RDW: 13.4 % (ref 11.5–15.5)
WBC: 8.8 10*3/uL (ref 4.0–10.5)

## 2014-04-24 LAB — WET PREP, GENITAL
Clue Cells Wet Prep HPF POC: NONE SEEN
Trich, Wet Prep: NONE SEEN
YEAST WET PREP: NONE SEEN

## 2014-04-24 LAB — URINE MICROSCOPIC-ADD ON

## 2014-04-24 LAB — URINALYSIS, ROUTINE W REFLEX MICROSCOPIC
BILIRUBIN URINE: NEGATIVE
Glucose, UA: NEGATIVE mg/dL
Ketones, ur: NEGATIVE mg/dL
NITRITE: NEGATIVE
PH: 6 (ref 5.0–8.0)
Protein, ur: NEGATIVE mg/dL
Specific Gravity, Urine: 1.007 (ref 1.005–1.030)
UROBILINOGEN UA: 0.2 mg/dL (ref 0.0–1.0)

## 2014-04-24 LAB — LIPASE, BLOOD: LIPASE: 24 U/L (ref 11–59)

## 2014-04-24 LAB — HCG, QUANTITATIVE, PREGNANCY: HCG, BETA CHAIN, QUANT, S: 101511 m[IU]/mL — AB (ref <5)

## 2014-04-24 LAB — POC URINE PREG, ED: Preg Test, Ur: POSITIVE — AB

## 2014-04-24 MED ORDER — IOHEXOL 300 MG/ML  SOLN
25.0000 mL | INTRAMUSCULAR | Status: AC
  Administered 2014-04-24: 25 mL via ORAL

## 2014-04-24 MED ORDER — SODIUM CHLORIDE 0.9 % IV BOLUS (SEPSIS)
2000.0000 mL | Freq: Once | INTRAVENOUS | Status: AC
  Administered 2014-04-24: 2000 mL via INTRAVENOUS

## 2014-04-24 MED ORDER — PRENATAL COMPLETE 14-0.4 MG PO TABS: 1.0000 | ORAL_TABLET | Freq: Every day | ORAL | Status: DC

## 2014-04-24 NOTE — ED Notes (Signed)
Pt is aware she needs a urine sample 

## 2014-04-24 NOTE — ED Notes (Signed)
Pt states that abd pain has been going on for several months, right lower quadrant, yesterday pain became unbearable, denies n/v/d. Pt states that lower abd is swollen.

## 2014-04-24 NOTE — ED Provider Notes (Signed)
CSN: [id number]     Arrival date & time 04/24/14  0408 History   First MD Initiated Contact with Patient 04/24/14 [phone]     Chief Complaint  Patient presents with  . Abdominal Pain     (Consider location/radiation/quality/duration/timing/severity/associated sxs/prior Treatment) HPI Patient presents with several months of lower abdominal pain that became worse last evening. She denies any nausea or vomiting. She's had no urinary symptoms. Denies any vaginal discharge or bleeding. Patient has not had a menstrual period since December. As related by her son she's had several miscarriages in the past. She's not currently on any birth control. Past Medical History  Diagnosis Date  . UTI (lower urinary tract infection)    Past Surgical History  Procedure Laterality Date  . Cesarean section     Family History  Problem Relation Age of Onset  . Diabetes Mother   . Cancer Paternal Grandmother     uterine   History  Substance Use Topics  . Smoking status: Never Smoker   . Smokeless tobacco: Never Used  . Alcohol Use: No   OB History    Gravida Para Term Preterm AB TAB SAB Ectopic Multiple Living   6 4 3 1 1  1   4      Review of Systems  Constitutional: Negative for fever and chills.  Gastrointestinal: Positive for abdominal pain. Negative for nausea, vomiting, diarrhea and constipation.  Genitourinary: Positive for pelvic pain. Negative for dysuria, hematuria, flank pain, vaginal bleeding, vaginal discharge and difficulty urinating.  Musculoskeletal: Negative for myalgias, back pain, neck pain and neck stiffness.  Skin: Negative for rash and wound.  Neurological: Negative for dizziness, weakness, light-headedness, numbness and headaches.  All other systems reviewed and are negative.     Allergies  Review of patient's allergies indicates no known allergies.  Home Medications   Prior to Admission medications   Medication Sig Start Date End Date Taking? Authorizing Provider   Prenatal Vit-Fe Fumarate-FA (PRENATAL COMPLETE) 14-0.4 MG TABS Take 1 tablet by mouth daily. 04/24/14   Glynn OctaveStephen Rancour, MD   BP 104/64 mmHg  Pulse 98  Temp(Src) 97.7 F (36.5 C) (Oral)  Resp 18  SpO2 100%  LMP  (LMP Unknown) Physical Exam  Constitutional: She is oriented to person, place, and time. She appears well-developed and well-nourished. No distress.  HENT:  Head: Normocephalic and atraumatic.  Mouth/Throat: Oropharynx is clear and moist.  Eyes: EOM are normal. Pupils are equal, round, and reactive to light.  Neck: Normal range of motion. Neck supple.  Cardiovascular: Normal rate and regular rhythm.   Pulmonary/Chest: Effort normal and breath sounds normal. No respiratory distress. She has no wheezes. She has no rales.  Abdominal: Soft. Bowel sounds are normal. She exhibits mass. There is tenderness.  Patient with suprapubic mass and tenderness to palpation. No rebound or guarding.  Genitourinary: Vaginal discharge (white vaginal discharge) found.  No cervical motion tenderness.  Musculoskeletal: Normal range of motion. She exhibits no edema or tenderness.  No CVA tenderness bilaterally.  Neurological: She is alert and oriented to person, place, and time.  Skin: Skin is warm and dry. No rash noted. No erythema.  Psychiatric: She has a normal mood and affect. Her behavior is normal.  Nursing note and vitals reviewed.   ED Course  Procedures (including critical care time) Labs Review Labs Reviewed  WET PREP, GENITAL - Abnormal; Notable for the following:    WBC, Wet Prep HPF POC MANY (*)    All other components within  normal limits  COMPREHENSIVE METABOLIC PANEL - Abnormal; Notable for the following:    Glucose, Bld 100 (*)    Albumin 3.1 (*)    All other components within normal limits  URINALYSIS, ROUTINE W REFLEX MICROSCOPIC - Abnormal; Notable for the following:    APPearance HAZY (*)    Hgb urine dipstick TRACE (*)    Leukocytes, UA TRACE (*)    All other  components within normal limits  HCG, QUANTITATIVE, PREGNANCY - Abnormal; Notable for the following:    hCG, Beta Chain, Quant, S [id number] (*)    All other components within normal limits  URINE MICROSCOPIC-ADD ON - Abnormal; Notable for the following:    Squamous Epithelial / LPF FEW (*)    All other components within normal limits  POC URINE PREG, ED - Abnormal; Notable for the following:    Preg Test, Ur POSITIVE (*)    All other components within normal limits  CBC WITH DIFFERENTIAL/PLATELET  LIPASE, BLOOD  HIV ANTIBODY (ROUTINE TESTING)  GC/CHLAMYDIA PROBE AMP ([place])    Imaging Review US Ob Comp Less 14 Wks  04/24/2014   CLINICAL DATA:  39 year old pregnant female with pelvic pain, nausea and vomiting. Uncertain dates.  EXAM: OBSTETRIC <14 WK ULTRASOUND  TECHNIQUE: Transabdominal ultrasound was performed for evaluation of the gestation as well as the maternal uterus and adnexal regions.  COMPARISON:  None. This exam was interpreted during a PACS downtime with limited availability of comparison cases.  FINDINGS: Intrauterine gestational sac: Visualized/normal in shape.  Yolk sac:  Not visualized  Embryo:  Visualized  Cardiac Activity: Visualized  Heart Rate: 141 bpm  CRL:   76.1  mm   13 w 5 d                  [place] EDC: 10/25/2014  Maternal uterus/adnexae: There is no evidence of subchorionic hemorrhage.  The ovaries bilaterally are not visualized.  There is no evidence of free fluid or adnexal mass.  IMPRESSION: Single living intrauterine gestation with estimated gestational age of [redacted] weeks 5 days by this ultrasound.  No evidence of subchorionic hemorrhage.   Electronically Signed   By: Harmon Pier M.D.   On: 04/24/2014 08:27     EKG Interpretation None      MDM   Final diagnoses:  Lower abdominal pain  Intrauterine pregnancy    Bedside ultrasound confirmed IUP. Negative FAST exam.  Signed out to oncoming emergency physician pending formal ultrasound. Anticipate discharge  home.  Loren Racer, MD 04/25/14 [phone]

## 2014-04-24 NOTE — ED Notes (Signed)
Patient transported to Ultrasound 

## 2014-04-24 NOTE — ED Provider Notes (Signed)
Care assumed from Dr. Ranae PalmsYelverton.  US pending of pregnancy. US shows IUP at [redacted] weeks gestation without complicated features.  Abdomen soft. No peritoneal signs.  Patient tolerating PO and ambulatory. Stable for follow up with OB.  Will start prenatal vitamins.   BP 104/64 mmHg  Pulse 98  Temp(Src) 97.7 F (36.5 C) (Oral)  Resp 18  SpO2 100%  LMP  (LMP Unknown)   Kayla OctaveStephen Alliana Mcauliff, MD 04/24/14 [id number]

## 2014-04-24 NOTE — Discharge Instructions (Signed)
Dolor abdominal durante el embarazo °(Abdominal Pain During Pregnancy) °El dolor de vientre (abdominal) es habitual durante el embarazo. Generalmente no se trata de un problema grave. Otras veces puede ser un signo de que algo no anda bien. Siempre comuníquese con su médico si tiene dolor abdominal. °CUIDADOS EN EL HOGAR °Controle el dolor para ver si hay cambios. Las indicaciones que siguen pueden ayudarla a sentirse mejor: °· Notenga sexo (relaciones sexuales) ni se coloque nada dentro de la vagina hasta que se sienta mejor. °· Haga reposo hasta que el dolor se calme. °· Si siente ganas de vomitar (náuseas ) beba líquidos claros. No consuma alimentos sólidos hasta que se sienta mejor. °· Sólo tome los medicamentos que le haya indicado su médico. °· Cumpla con las visitas al médico según las indicaciones. °SOLICITE AYUDA DE INMEDIATO SI:  °· Tiene un sangrado, pierde líquido o elimina trozos de tejido por la vagina. °· Siente más dolor o cólicos. °· Comienza a vomitar. °· Siente dolor al orinar u observa sangre en la orina. °· Tiene fiebre. °· No siente que el bebé se mueva mucho. °· Se siente muy débil o cree que va a desmayarse. °· Tiene dificultad para respirar con o sin dolor en el vientre. °· Siente un dolor de cabeza muy intenso y dolor en el vientre. °· Observa que sale un líquido por la vagina y tiene dolor abdominal. °· La materia fecal es líquida (diarrea). °· El dolor en el viente no desaparece, o empeora, luego de hacer reposo. °ASEGÚRESE DE QUE:  °· Comprende estas instrucciones. °· Controlará su afección. °· Recibirá ayuda de inmediato si no mejora o si empeora. °Document Released: 09/12/2010 Document Revised: 09/02/2012 °ExitCare® Patient Information ©2015 ExitCare, LLC. This information is not intended to replace advice given to you by your health care provider. Make sure you discuss any questions you have with your health care provider. ° °

## 2014-04-24 NOTE — ED Notes (Signed)
Pt ambulated well independently to restroom. Sprite given for po challenge.

## 2014-04-25 LAB — GC/CHLAMYDIA PROBE AMP (~~LOC~~) NOT AT ARMC
CHLAMYDIA, DNA PROBE: NEGATIVE
NEISSERIA GONORRHEA: NEGATIVE

## 2014-04-26 LAB — HIV ANTIBODY (ROUTINE TESTING W REFLEX): HIV SCREEN 4TH GENERATION: NONREACTIVE

## 2014-05-16 ENCOUNTER — Other Ambulatory Visit (HOSPITAL_COMMUNITY): Payer: Self-pay | Admitting: Urology

## 2014-05-16 DIAGNOSIS — Z3689 Encounter for other specified antenatal screening: Secondary | ICD-10-CM

## 2014-05-16 LAB — AFP MATERNAL TRIPLE SCREEN: AFP, SERUM MAT SCREEN: NEGATIVE

## 2014-05-16 LAB — OB RESULTS CONSOLE VARICELLA ZOSTER ANTIBODY, IGG: Varicella: IMMUNE

## 2014-05-16 LAB — OB RESULTS CONSOLE ABO/RH: RH Type: POSITIVE

## 2014-05-16 LAB — CYSTIC FIBROSIS DIAGNOSTIC STUDY: INTERPRETATION-CFDNA: NEGATIVE

## 2014-05-16 LAB — OB RESULTS CONSOLE ANTIBODY SCREEN: Antibody Screen: NEGATIVE

## 2014-05-17 LAB — OB RESULTS CONSOLE GC/CHLAMYDIA
CHLAMYDIA, DNA PROBE: NEGATIVE
GC PROBE AMP, GENITAL: NEGATIVE

## 2014-05-17 LAB — OB RESULTS CONSOLE HGB/HCT, BLOOD
HEMATOCRIT: 40 %
Hemoglobin: 12.7 g/dL

## 2014-05-17 LAB — OB RESULTS CONSOLE RUBELLA ANTIBODY, IGM: Rubella: IMMUNE

## 2014-05-17 LAB — OB RESULTS CONSOLE HIV ANTIBODY (ROUTINE TESTING): HIV: NONREACTIVE

## 2014-05-17 LAB — OB RESULTS CONSOLE HEPATITIS B SURFACE ANTIGEN: Hepatitis B Surface Ag: NEGATIVE

## 2014-05-17 LAB — OB RESULTS CONSOLE PLATELET COUNT: Platelets: 277 10*3/uL

## 2014-05-17 LAB — OB RESULTS CONSOLE RPR: RPR: NONREACTIVE

## 2014-05-23 ENCOUNTER — Encounter: Payer: Self-pay | Admitting: Obstetrics and Gynecology

## 2014-05-23 ENCOUNTER — Ambulatory Visit (INDEPENDENT_AMBULATORY_CARE_PROVIDER_SITE_OTHER): Payer: Self-pay | Admitting: Obstetrics and Gynecology

## 2014-05-23 ENCOUNTER — Encounter: Payer: Medicaid Other | Attending: Obstetrics and Gynecology | Admitting: *Deleted

## 2014-05-23 ENCOUNTER — Encounter: Payer: Self-pay | Admitting: *Deleted

## 2014-05-23 VITALS — BP 120/75 | HR 98 | Temp 98.4°F | Wt 248.4 lb

## 2014-05-23 DIAGNOSIS — O09522 Supervision of elderly multigravida, second trimester: Secondary | ICD-10-CM

## 2014-05-23 DIAGNOSIS — O34219 Maternal care for unspecified type scar from previous cesarean delivery: Secondary | ICD-10-CM | POA: Insufficient documentation

## 2014-05-23 DIAGNOSIS — O24419 Gestational diabetes mellitus in pregnancy, unspecified control: Secondary | ICD-10-CM | POA: Diagnosis present

## 2014-05-23 DIAGNOSIS — Z713 Dietary counseling and surveillance: Secondary | ICD-10-CM | POA: Diagnosis not present

## 2014-05-23 DIAGNOSIS — O3421 Maternal care for scar from previous cesarean delivery: Secondary | ICD-10-CM

## 2014-05-23 DIAGNOSIS — O09529 Supervision of elderly multigravida, unspecified trimester: Secondary | ICD-10-CM | POA: Insufficient documentation

## 2014-05-23 DIAGNOSIS — O09292 Supervision of pregnancy with other poor reproductive or obstetric history, second trimester: Secondary | ICD-10-CM

## 2014-05-23 DIAGNOSIS — O0990 Supervision of high risk pregnancy, unspecified, unspecified trimester: Secondary | ICD-10-CM | POA: Insufficient documentation

## 2014-05-23 DIAGNOSIS — Z8751 Personal history of pre-term labor: Secondary | ICD-10-CM | POA: Insufficient documentation

## 2014-05-23 LAB — BASIC METABOLIC PANEL
BUN: 7 mg/dL (ref 6–23)
CHLORIDE: 103 meq/L (ref 96–112)
CO2: 25 mEq/L (ref 19–32)
CREATININE: 0.54 mg/dL (ref 0.50–1.10)
Calcium: 9.2 mg/dL (ref 8.4–10.5)
Glucose, Bld: 102 mg/dL — ABNORMAL HIGH (ref 70–99)
POTASSIUM: 3.9 meq/L (ref 3.5–5.3)
Sodium: 134 mEq/L — ABNORMAL LOW (ref 135–145)

## 2014-05-23 LAB — POCT URINALYSIS DIP (DEVICE)
Bilirubin Urine: NEGATIVE
Glucose, UA: NEGATIVE mg/dL
KETONES UR: NEGATIVE mg/dL
Leukocytes, UA: NEGATIVE
NITRITE: NEGATIVE
PH: 6 (ref 5.0–8.0)
Protein, ur: NEGATIVE mg/dL
SPECIFIC GRAVITY, URINE: 1.01 (ref 1.005–1.030)
Urobilinogen, UA: 0.2 mg/dL (ref 0.0–1.0)

## 2014-05-23 LAB — TSH: TSH: 0.734 u[IU]/mL (ref 0.350–4.500)

## 2014-05-23 LAB — HEMOGLOBIN A1C
Hgb A1c MFr Bld: 5.4 % (ref <5.7)
MEAN PLASMA GLUCOSE: 108 mg/dL (ref <117)

## 2014-05-23 NOTE — Progress Notes (Incomplete)
Genetic counseling- U/S 05/31/14 @ 915a with [facility]

## 2014-05-23 NOTE — Progress Notes (Signed)
Kayla Scott Spanish Interpreter Initial OB appointment Needs Diabetes/Nutrition

## 2014-05-23 NOTE — Progress Notes (Deleted)
Genetic counseling/ U/S 05/31/14 @ 915a with [facility].  [facility]

## 2014-05-23 NOTE — Progress Notes (Signed)
Pt here today for new PNV, transfer from HD 2/2 h/o PTB (7 mos; 2/2 hemorrhage), A1GDM AMA No h/o DM with pregnancies.  No h/o HTN.  #) DM in pregnancy - Got counseling today - going to start checking accuchecks at home - US on 5/20 - A1c, TSH, BMP, UP:C today  #) H/o PTB - 2/2 placenta previa, no need for 17p  #) AMA - Risk down syndrome 1:156-- referred for genetic counseling  Desires BTL, will meet for consent  + FM, no VB, no LOF, No contractions Only concern is sexual appetitie increased Need PNL from 5/2, do not yet have from HD

## 2014-05-24 LAB — PROTEIN / CREATININE RATIO, URINE
Creatinine, Urine: 35.5 mg/dL
Total Protein, Urine: <4 mg/dL — ABNORMAL LOW (ref 5–24)

## 2014-05-25 ENCOUNTER — Encounter (HOSPITAL_COMMUNITY): Payer: Self-pay | Admitting: Obstetrics and Gynecology

## 2014-05-29 ENCOUNTER — Inpatient Hospital Stay (HOSPITAL_COMMUNITY)
Admission: AD | Admit: 2014-05-29 | Discharge: 2014-05-29 | Disposition: A | Discharge status: Home / Self Care | Payer: Medicaid Other | Source: Ambulatory Visit | Attending: Obstetrics and Gynecology | Admitting: Obstetrics and Gynecology

## 2014-05-29 ENCOUNTER — Encounter (HOSPITAL_COMMUNITY): Payer: Self-pay

## 2014-05-29 DIAGNOSIS — B3731 Acute candidiasis of vulva and vagina: Secondary | ICD-10-CM

## 2014-05-29 DIAGNOSIS — Z3A18 18 weeks gestation of pregnancy: Secondary | ICD-10-CM | POA: Diagnosis not present

## 2014-05-29 DIAGNOSIS — O98812 Other maternal infectious and parasitic diseases complicating pregnancy, second trimester: Secondary | ICD-10-CM | POA: Diagnosis not present

## 2014-05-29 DIAGNOSIS — B373 Candidiasis of vulva and vagina: Secondary | ICD-10-CM

## 2014-05-29 DIAGNOSIS — R102 Pelvic and perineal pain: Secondary | ICD-10-CM | POA: Diagnosis present

## 2014-05-29 LAB — URINE MICROSCOPIC-ADD ON

## 2014-05-29 LAB — URINALYSIS, ROUTINE W REFLEX MICROSCOPIC
Bilirubin Urine: NEGATIVE
Glucose, UA: NEGATIVE mg/dL
Ketones, ur: NEGATIVE mg/dL
Leukocytes, UA: NEGATIVE
Nitrite: NEGATIVE
Protein, ur: NEGATIVE mg/dL
UROBILINOGEN UA: 0.2 mg/dL (ref 0.0–1.0)
pH: 6 (ref 5.0–8.0)

## 2014-05-29 LAB — WET PREP, GENITAL
Clue Cells Wet Prep HPF POC: NONE SEEN
Trich, Wet Prep: NONE SEEN
Yeast Wet Prep HPF POC: NONE SEEN

## 2014-05-29 MED ORDER — MICONAZOLE NITRATE 2 % VA CREA: 1.0000 | TOPICAL_CREAM | Freq: Every day | VAGINAL | Status: DC

## 2014-05-29 NOTE — Discharge Instructions (Signed)
°  Vulvovaginitis Candidisica (Candidal Vulvovaginitis) La vulvovaginitis candidisica es una infeccin de la vagina y la vulva. La vulva es la piel que rodea la abertura de la vagina. Puede causar picazn y [place]estias dentro de y alrededor de la vagina.  CUIDADOS EN EL HOGAR  Slo tome medicamentos como lo indique su mdico.  No mantenga relaciones sexuales hasta que la infeccin haya curado o segn le indique el mdico.  Practique sexo seguro.  Informe a su compaero sexual acerca de su infeccin.  No tome duchas vaginales ni use tampones.  Use ropa interior de algodn. No utilice pantalones ni pantimedias ajustados.  Coma yogur. Esto puede ayudar a tratar y prevenir las infecciones por cndida. SOLICITE AYUDA DE INMEDIATO SI:   Tiene fiebre.  Los problemas empeoran durante el tratamiento, o si no mejora luego de [address] das.  Tiene malestar, irritacin, o picazn en la zona de la vagina o la vulva.  Siente dolor en al [employer] Comantener relaciones sexuales.  Comienza a sentir dolor abdominal. ASEGRESE DE QUE:  Comprende estas instrucciones.  Controlar su enfermedad.  Solicitar ayuda de inmediato si no mejora o empeora. Document Released: 02/02/2010 Document Revised: 01/05/2013 [facility] Patient Information 2015 [place]ak[place]ExitCare, MarylandLLC. This information is not intended to replace advice given to you by your health care provider. Make sure you discuss any questions you have with your health care provider.

## 2014-05-29 NOTE — MAU Note (Signed)
Patient reports to MAU c/o of vaginal pain. She states the pain started yesterday morning after having sexual intercourse. She thought the pain would go away but it did not. She states she has no vaginal bleeding or discharge and some pain with urination.

## 2014-05-29 NOTE — Progress Notes (Signed)
Assisted RN with interpretation of patient assessment.  °Spanish interpreter  °

## 2014-05-29 NOTE — Progress Notes (Signed)
Assisted RN with patient assessment interpretation  Spanish Interpreter

## 2014-05-29 NOTE — MAU Provider Note (Signed)
Chief Complaint: Vaginal Pain  First Provider Initiated Contact with Patient 05/29/14 1640     SUBJECTIVE HPI: Kayla Scott is a 39 y.o. [id number] at 1849w5d by LMP who presents to Maternity Admissions reporting intermittent, sharp vaginal pain and dysuria since IC 05/27/14. No fever, chills, VB or vaginal discharge. [facility]   Bedside interpreter used.  Past Medical History  Diagnosis Date  . UTI (lower urinary tract infection)   . Diabetes mellitus without complication 2016  . Gestational diabetes    OB History  Gravida Para Term Preterm AB SAB TAB Ectopic Multiple Living  8 4 3 1 3 3    4     # Outcome Date GA Lbr Len/2nd Weight Sex Delivery Anes PTL Lv  8 Current           7 SAB 01/14/13 4742w0d         6 SAB 01/15/12 1442w0d         5 SAB 01/14/09 2284w0d            Comments: System Generated. Please review and update pregnancy details.  4 Preterm 07/24/01 4841w0d   M Constance GoltzCS-LTranv Gen Y Y  3 Term 11/09/98 7469w0d   F CS-LTranv Spinal N Y  2 Term 10/05/94 1369w0d   Kateri PlummerM Vag-Spont  N Y  1 Term 01/12/93 3569w0d   Elouise MunroeM Vag-Spont  N Y     Past Surgical History  Procedure Laterality Date  . Cesarean section     History   Social History  . Marital Status: Married    Spouse Name: N/A  . Number of Children: N/A  . Years of Education: N/A   Occupational History  . Not on file.   Social History Main Topics  . Smoking status: Never Smoker   . Smokeless tobacco: Never Used  . Alcohol Use: No  . Drug Use: No  . Sexual Activity: Yes    Birth Control/ Protection: None   Other Topics Concern  . Not on file   Social History Narrative   No current facility-administered medications on file prior to encounter.   Current Outpatient Prescriptions on File Prior to Encounter  Medication Sig Dispense Refill  . Prenatal Vit-Fe Fumarate-FA (PRENATAL COMPLETE) 14-0.4 MG TABS Take 1 tablet by mouth daily. 60 each 0   No Known Allergies  Review of Systems  Constitutional: Negative  for fever and chills.  Gastrointestinal: Positive for abdominal pain (mild bilat groin pain ). Negative for nausea, vomiting, diarrhea and constipation.  Genitourinary: Positive for dysuria. Negative for urgency, frequency, hematuria and flank pain.       Neg vaginal bleeding, vaginal discharge.   Musculoskeletal: Negative for back pain.  Skin: Positive for itching (vaginal).    OBJECTIVE Blood pressure 90/50, pulse 91, temperature 98.8 F (37.1 C), temperature source Oral, resp. rate 18, last menstrual period 12/31/2013. GENERAL: Well-developed, well-nourished, obese female in no acute distress.  HEART: normal rate RESP: normal effort GI: Abdomen soft, non-tender. Positive bowel sounds 4. Fundal height @U . MS: Nontender, no edema NEURO: Alert and oriented SPECULUM EXAM: NEFG, moderate amount if thick, white, curd-like discharge, no blood noted, cervix clean BIMANUAL: cervix closed and long, no adnexal tenderness or masses  LAB RESULTS Results for orders placed or performed during the hospital encounter of 05/29/14 (from the past 24 hour(s))  Urinalysis, Routine w reflex microscopic     Status: Abnormal   Collection Time: 05/29/14  2:53 PM  Result Value Ref Range   Color, Urine  YELLOW YELLOW   APPearance CLEAR CLEAR   Specific Gravity, Urine >1.030 (H) 1.005 - 1.030   pH 6.0 5.0 - 8.0   Glucose, UA NEGATIVE NEGATIVE mg/dL   Hgb urine dipstick TRACE (A) NEGATIVE   Bilirubin Urine NEGATIVE NEGATIVE   Ketones, ur NEGATIVE NEGATIVE mg/dL   Protein, ur NEGATIVE NEGATIVE mg/dL   Urobilinogen, UA 0.2 0.0 - 1.0 mg/dL   Nitrite NEGATIVE NEGATIVE   Leukocytes, UA NEGATIVE NEGATIVE  Urine microscopic-add on     Status: None   Collection Time: 05/29/14  2:53 PM  Result Value Ref Range   WBC, UA 0-2 <3 WBC/hpf   RBC / HPF 3-6 <3 RBC/hpf  Wet prep, genital     Status: Abnormal   Collection Time: 05/29/14  5:25 PM  Result Value Ref Range   Yeast Wet Prep HPF POC NONE SEEN NONE SEEN    Trich, Wet Prep NONE SEEN NONE SEEN   Clue Cells Wet Prep HPF POC NONE SEEN NONE SEEN   WBC, Wet Prep HPF POC MANY (A) NONE SEEN    IMAGING No results found.  MAU COURSE  ASSESSMENT 1. Vaginal yeast infection    PLAN Discharge home in stable condition. Urine culture pending.     Follow-up Information    Follow [facility] with [facility] On 05/31/2014.   Specialty:  Maternal and Fetal Medicine   Why:  [facility]   Contact information:   [address] [id number] mc [place] [id number] [phone]       Medication List    TAKE these medications        miconazole 2 % vaginal cream  Commonly known as:  MONISTAT 7  Place 1 Applicatorful vaginally at bedtime.     PRENATAL COMPLETE 14-0.4 MG Tabs  Take 1 tablet by mouth daily.         AmberleyVirginia Jameir Ake, [place]CNM 05/29/2014  6:36 PM

## 2014-05-30 ENCOUNTER — Encounter: Payer: Self-pay | Admitting: Family Medicine

## 2014-05-30 ENCOUNTER — Encounter: Payer: Self-pay | Admitting: Obstetrics & Gynecology

## 2014-05-30 DIAGNOSIS — O24419 Gestational diabetes mellitus in pregnancy, unspecified control: Secondary | ICD-10-CM | POA: Insufficient documentation

## 2014-05-30 LAB — CULTURE, OB URINE
COLONY COUNT: NO GROWTH
CULTURE: NO GROWTH
SPECIAL REQUESTS: NORMAL

## 2014-05-30 LAB — GC/CHLAMYDIA PROBE AMP (~~LOC~~) NOT AT ARMC
Chlamydia: NEGATIVE
Neisseria Gonorrhea: NEGATIVE

## 2014-05-31 ENCOUNTER — Ambulatory Visit (HOSPITAL_COMMUNITY)
Admission: RE | Admit: 2014-05-31 | Discharge: 2014-05-31 | Disposition: A | Discharge status: Home / Self Care | Payer: Medicaid Other | Source: Ambulatory Visit | Attending: Obstetrics and Gynecology | Admitting: Obstetrics and Gynecology

## 2014-05-31 ENCOUNTER — Ambulatory Visit (INDEPENDENT_AMBULATORY_CARE_PROVIDER_SITE_OTHER): Payer: Self-pay | Admitting: Family

## 2014-05-31 ENCOUNTER — Encounter (HOSPITAL_COMMUNITY): Payer: Self-pay

## 2014-05-31 ENCOUNTER — Encounter: Payer: Self-pay | Admitting: *Deleted

## 2014-05-31 VITALS — BP 110/79 | HR 85 | Wt 246.1 lb

## 2014-05-31 DIAGNOSIS — O24419 Gestational diabetes mellitus in pregnancy, unspecified control: Secondary | ICD-10-CM

## 2014-05-31 DIAGNOSIS — Z3A19 19 weeks gestation of pregnancy: Secondary | ICD-10-CM | POA: Insufficient documentation

## 2014-05-31 DIAGNOSIS — O09522 Supervision of elderly multigravida, second trimester: Secondary | ICD-10-CM

## 2014-05-31 DIAGNOSIS — O351XX0 Maternal care for (suspected) chromosomal abnormality in fetus, not applicable or unspecified: Secondary | ICD-10-CM | POA: Diagnosis not present

## 2014-05-31 DIAGNOSIS — O359XX0 Maternal care for (suspected) fetal abnormality and damage, unspecified, not applicable or unspecified: Secondary | ICD-10-CM | POA: Insufficient documentation

## 2014-05-31 DIAGNOSIS — Z3689 Encounter for other specified antenatal screening: Secondary | ICD-10-CM

## 2014-05-31 DIAGNOSIS — Z315 Encounter for genetic counseling: Secondary | ICD-10-CM | POA: Insufficient documentation

## 2014-05-31 DIAGNOSIS — O09529 Supervision of elderly multigravida, unspecified trimester: Secondary | ICD-10-CM | POA: Insufficient documentation

## 2014-05-31 DIAGNOSIS — O09899 Supervision of other high risk pregnancies, unspecified trimester: Secondary | ICD-10-CM | POA: Insufficient documentation

## 2014-05-31 DIAGNOSIS — O280 Abnormal hematological finding on antenatal screening of mother: Secondary | ICD-10-CM | POA: Insufficient documentation

## 2014-05-31 DIAGNOSIS — O0992 Supervision of high risk pregnancy, unspecified, second trimester: Secondary | ICD-10-CM

## 2014-05-31 DIAGNOSIS — O0990 Supervision of high risk pregnancy, unspecified, unspecified trimester: Secondary | ICD-10-CM

## 2014-05-31 LAB — POCT URINALYSIS DIP (DEVICE)
Bilirubin Urine: NEGATIVE
Glucose, UA: NEGATIVE mg/dL
NITRITE: NEGATIVE
Protein, ur: NEGATIVE mg/dL
SPECIFIC GRAVITY, URINE: 1.025 (ref 1.005–1.030)
Urobilinogen, UA: 0.2 mg/dL (ref 0.0–1.0)
pH: 6.5 (ref 5.0–8.0)

## 2014-05-31 MED ORDER — ASPIRIN EC 81 MG PO TBEC: 81.0000 mg | DELAYED_RELEASE_TABLET | Freq: Every day | ORAL | Status: DC

## 2014-05-31 MED ORDER — NITROFURANTOIN MONOHYD MACRO 100 MG PO CAPS: 100.0000 mg | ORAL_CAPSULE | Freq: Two times a day (BID) | ORAL | Status: DC

## 2014-05-31 MED ORDER — PRENATAL COMPLETE 14-0.4 MG PO TABS: 1.0000 | ORAL_TABLET | Freq: Every day | ORAL | Status: DC

## 2014-05-31 NOTE — Progress Notes (Signed)
Doing well, forgot her log.  Reports her FBGs are in the 80-90's, have had a couple over 120.  Recognized it was after going to a [place] buffet.  FBS this AM - 86.  Genetic counseling and ultrasound with MFM today.  Scheduled fetal echo.  Pt not aware to take baby aspirin - will begin taking, RX sent.  Interpreter - Raquel.  Large leuks in urine - RX Macrobid and urine culture sent.  Lab results not in chart - notified Marchelle Folksmanda, will contact the health department.

## 2014-05-31 NOTE — Progress Notes (Signed)
Called Dr. Elizebeth Brookingotton office they will call patient to schedule her echo. Patient is aware.

## 2014-05-31 NOTE — Progress Notes (Signed)
Pt reports going to MAU for vaginal pain on 05/29/14; pt tx's with monistat with urine culture resulting no colonies

## 2014-06-02 DIAGNOSIS — O09529 Supervision of elderly multigravida, unspecified trimester: Secondary | ICD-10-CM | POA: Insufficient documentation

## 2014-06-02 LAB — CULTURE, OB URINE: Colony Count: 6000

## 2014-06-02 NOTE — Progress Notes (Signed)
Genetic Counseling  High-Risk Gestation Note  Appointment Date:  05/31/2014 Referred By: Ethelda Chickoberts, Caroline, MD Date of Birth:  1975-10-25 Partner: Lottie RaterEsteban Popola   Pregnancy History: [id number]: [id number] Estimated Date of Delivery: 10/25/14 Estimated Gestational Age: 6237w2d Attending: Particia NearingMartha Decker, MD   Ms. Bufford ButtnerJosefina Dominguez-Urieta and her husband, Mr. Lottie Ratersteban Popola, were seen for genetic counseling regarding a maternal age of 39 y.o. and an increased risk for Down syndrome based [facility]. [facility] Spanish/English medical interpreter, Roddie McMarlene, provided interpretation for today's visit.   They were counseled regarding maternal age and the association with risk for chromosome conditions due to nondisjunction with aging of the ova.   We reviewed chromosomes, nondisjunction, and the associated 1 in 7570 risk for fetal aneuploidy related to a maternal age of 39 y.o. at 350w0d gestation.  They were counseled that the risk for aneuploidy decreases as gestational age increases, accounting for those pregnancies which spontaneously abort.  We specifically discussed Down syndrome (trisomy 8421), trisomies 2613 and 8018, and sex chromosome aneuploidies (47,XXX and 47,XXY) including the common features and prognoses of each.   We also reviewed Ms. Dominguez-Urieta's maternal serum Quad screen result and the associated increase in risk for fetal Down syndrome (1 in 179 to 1 in 156).  They were counseled regarding other explanations for a screen positive result including normal variation and differences in maternal metabolism.  In addition, we reviewed the screen adjusted reduction in risks for trisomy 18 (1 in 540 to 1 in 6,362) and ONTDs.  They understand that Quad screening provides a pregnancy specific risk for Down syndrome, but is not considered to be diagnostic.    We reviewed additional available screening options including noninvasive prenatal screening (NIPS)/cell free DNA  (cfDNA) testing and detailed ultrasound.  They were counseled that screening tests are used to modify a patient's a priori risk for aneuploidy, typically based on age. This estimate provides a pregnancy specific risk assessment. We reviewed the benefits and limitations of each option. Specifically, we discussed the conditions for which each test screens, the detection rates, and false positive rates of each. They were also counseled regarding diagnostic testing via amniocentesis. We reviewed the approximate 1 in 300-500 risk for complications for amniocentesis, including spontaneous pregnancy loss.   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 Complete ultrasound results reported separately.   We discussed that congenital pulmonary airway malformation (CPAM), is one of the most common lung lesions diagnosed prenatally.  The diagnosis of CPAM is suggested by the presence of a solid-appearing mass in the fetal chest on ultrasound examination. These masses are most commonly identified in the second trimester.  CPAM affects the right and left lungs with equal frequency, but bilateral lung involvement is rare. Only one lobe is involved in 85 to 95 percent of cases and the lower lobe is the most common site, but any lobe can be affected.  Because additional anomalies can be seen when a CPAM is present a fetal echo will be scheduled to further evaluate the fetal heart structure and if it has been shifted in the chest.   It is difficult to predict whether the lesion will regress or continue to grow and lead to significant problems, including hydrops or require postnatal surgical intervention.  We discussed that most CPAMs demonstrate rapid progressive growth from about weeks 20 to 26 of gestation, and then  growth plateaus and often regresses.  In the absence of hydrops, the  prognosis is good, however, up to one-third of fetuses with CPAM develop hydrops.  For this reason, we will continue to follow the pregnancy with serial ultrasounds. Follow-up ultrasound was scheduled for 06/17/14.    Deloros Dominguez-Urieta was counseled that SUA occurs in approximately 1 in every 200 pregnancies and is defined as the presence of one umbilical artery and one umbilical vein, instead of the typical three vessel cord containing two arteries and one vein.  We discussed that the finding of an isolated SUA (no other markers or anomalies visualized) is not thought to increase the risk for fetal aneuploidy. However, the literature is conflicting regarding an association between this finding and congenital heart defects.  In addition, we discussed that SUA is known to be associated with an increased risk for fetal growth restriction.  We reviewed the options of serial ultrasound to monitor fetal growth and a fetal echocardiogram. We reviewed that serial ultrasounds will be offered in the current pregnancy also because of the fetal chest mass visualized on ultrasound.  Ms. Ermalinda MemosDominguez-Urieta indicated that fetal echocardiogram is being planned given her personal history of diabetes.   After consideration of all the options, she declined additional screening and testing for fetal aneuploidy, including NIPS and amniocentesis, stating that she would not want this information prior to delivery. They understand that screening tests cannot rule out all birth defects or genetic syndromes. The patient was advised of this limitation and states she still does not want additional testing at this time.   Both family histories were reviewed and found to be contributory for congenital heart disease for a female maternal first cousin of the father of the pregnancy. This relative had heart surgery at approximately age 56 year. He is currently 38 years old and healthy. Limited information was known regarding the congenital  heart disease. Congenital heart defects occur in approximately 1% of pregnancies.  Congenital heart defects may occur due to multifactorial influences, chromosomal abnormalities, genetic syndromes or environmental exposures.  Isolated heart defects are generally multifactorial.  Given the reported family history and assuming multifactorial inheritance, the risk for a congenital heart defect in the current pregnancy does not appear to be increased above the general population risk. Without further information regarding the provided family history, an accurate genetic risk cannot be calculated. Further genetic counseling is warranted if more information is obtained.  Ms. Bufford ButtnerJosefina Dominguez-Urieta denied exposure to environmental toxins or chemical agents. She denied the use of alcohol, tobacco or street drugs. She denied significant viral illnesses during the course of her pregnancy. Her medical and surgical histories were contributory for diabetes mellitus, which she reported is diet controlled.      I counseled this couple regarding the above risks and available options.  The approximate face-to-face time with the genetic counselor was 45 minutes.    Quinn PlowmanKaren Ion Gonnella, MS,  Certified Genetic Counselor 06/02/2014

## 2014-06-03 ENCOUNTER — Ambulatory Visit (HOSPITAL_COMMUNITY): Payer: MEDICAID

## 2014-06-08 ENCOUNTER — Other Ambulatory Visit: Payer: Self-pay | Admitting: Family

## 2014-06-08 DIAGNOSIS — O24410 Gestational diabetes mellitus in pregnancy, diet controlled: Secondary | ICD-10-CM

## 2014-06-08 DIAGNOSIS — O09522 Supervision of elderly multigravida, second trimester: Secondary | ICD-10-CM

## 2014-06-10 ENCOUNTER — Other Ambulatory Visit (HOSPITAL_COMMUNITY): Payer: Self-pay | Admitting: Urology

## 2014-06-17 ENCOUNTER — Other Ambulatory Visit: Payer: Self-pay | Admitting: Family

## 2014-06-17 ENCOUNTER — Ambulatory Visit (HOSPITAL_COMMUNITY)
Admission: RE | Admit: 2014-06-17 | Discharge: 2014-06-17 | Disposition: A | Discharge status: Home / Self Care | Payer: MEDICAID | Source: Ambulatory Visit | Attending: Family | Admitting: Family

## 2014-06-17 ENCOUNTER — Other Ambulatory Visit (HOSPITAL_COMMUNITY): Payer: Self-pay | Admitting: Maternal and Fetal Medicine

## 2014-06-17 ENCOUNTER — Encounter (HOSPITAL_COMMUNITY): Payer: Self-pay

## 2014-06-17 DIAGNOSIS — O24410 Gestational diabetes mellitus in pregnancy, diet controlled: Secondary | ICD-10-CM

## 2014-06-17 DIAGNOSIS — E119 Type 2 diabetes mellitus without complications: Secondary | ICD-10-CM | POA: Insufficient documentation

## 2014-06-17 DIAGNOSIS — O24312 Unspecified pre-existing diabetes mellitus in pregnancy, second trimester: Secondary | ICD-10-CM | POA: Insufficient documentation

## 2014-06-17 DIAGNOSIS — Z3A21 21 weeks gestation of pregnancy: Secondary | ICD-10-CM | POA: Insufficient documentation

## 2014-06-17 DIAGNOSIS — O09522 Supervision of elderly multigravida, second trimester: Secondary | ICD-10-CM

## 2014-06-17 DIAGNOSIS — O283 Abnormal ultrasonic finding on antenatal screening of mother: Secondary | ICD-10-CM | POA: Insufficient documentation

## 2014-06-17 DIAGNOSIS — O358XX0 Maternal care for other (suspected) fetal abnormality and damage, not applicable or unspecified: Secondary | ICD-10-CM | POA: Insufficient documentation

## 2014-06-17 DIAGNOSIS — O3421 Maternal care for scar from previous cesarean delivery: Secondary | ICD-10-CM | POA: Insufficient documentation

## 2014-06-20 ENCOUNTER — Encounter (HOSPITAL_COMMUNITY): Payer: Self-pay | Admitting: Family

## 2014-06-20 ENCOUNTER — Other Ambulatory Visit (HOSPITAL_COMMUNITY): Payer: Self-pay | Admitting: Family

## 2014-06-21 ENCOUNTER — Encounter: Payer: Self-pay | Admitting: Family

## 2014-07-01 ENCOUNTER — Ambulatory Visit (HOSPITAL_COMMUNITY)
Admission: RE | Admit: 2014-07-01 | Discharge: 2014-07-01 | Disposition: A | Discharge status: Home / Self Care | Payer: MEDICAID | Source: Ambulatory Visit | Attending: Family | Admitting: Family

## 2014-07-01 ENCOUNTER — Encounter (HOSPITAL_COMMUNITY): Payer: Self-pay

## 2014-07-01 DIAGNOSIS — O24410 Gestational diabetes mellitus in pregnancy, diet controlled: Secondary | ICD-10-CM

## 2014-07-01 DIAGNOSIS — Z3A23 23 weeks gestation of pregnancy: Secondary | ICD-10-CM | POA: Insufficient documentation

## 2014-07-01 DIAGNOSIS — O3421 Maternal care for scar from previous cesarean delivery: Secondary | ICD-10-CM | POA: Insufficient documentation

## 2014-07-01 DIAGNOSIS — O09212 Supervision of pregnancy with history of pre-term labor, second trimester: Secondary | ICD-10-CM | POA: Insufficient documentation

## 2014-07-01 DIAGNOSIS — O09522 Supervision of elderly multigravida, second trimester: Secondary | ICD-10-CM

## 2014-07-01 DIAGNOSIS — O283 Abnormal ultrasonic finding on antenatal screening of mother: Secondary | ICD-10-CM | POA: Insufficient documentation

## 2014-07-01 DIAGNOSIS — O358XX0 Maternal care for other (suspected) fetal abnormality and damage, not applicable or unspecified: Secondary | ICD-10-CM | POA: Insufficient documentation

## 2014-07-01 NOTE — ED Notes (Signed)
Language Resource interpreter Winfred Leeds with pt.

## 2014-07-04 ENCOUNTER — Ambulatory Visit (INDEPENDENT_AMBULATORY_CARE_PROVIDER_SITE_OTHER): Payer: Self-pay | Admitting: Family Medicine

## 2014-07-04 VITALS — BP 112/71 | HR 105 | Temp 98.4°F | Wt 248.4 lb

## 2014-07-04 DIAGNOSIS — O09522 Supervision of elderly multigravida, second trimester: Secondary | ICD-10-CM

## 2014-07-04 DIAGNOSIS — O3421 Maternal care for scar from previous cesarean delivery: Secondary | ICD-10-CM

## 2014-07-04 DIAGNOSIS — O34219 Maternal care for unspecified type scar from previous cesarean delivery: Secondary | ICD-10-CM

## 2014-07-04 DIAGNOSIS — O0992 Supervision of high risk pregnancy, unspecified, second trimester: Secondary | ICD-10-CM

## 2014-07-04 DIAGNOSIS — O24419 Gestational diabetes mellitus in pregnancy, unspecified control: Secondary | ICD-10-CM

## 2014-07-04 LAB — POCT URINALYSIS DIP (DEVICE)
BILIRUBIN URINE: NEGATIVE
GLUCOSE, UA: NEGATIVE mg/dL
Ketones, ur: 80 mg/dL — AB
Leukocytes, UA: NEGATIVE
Nitrite: NEGATIVE
Protein, ur: NEGATIVE mg/dL
Urobilinogen, UA: 1 mg/dL (ref 0.0–1.0)
pH: 6 (ref 5.0–8.0)

## 2014-07-04 MED ORDER — METFORMIN HCL 500 MG PO TABS: 500.0000 mg | ORAL_TABLET | Freq: Every day | ORAL | Status: DC

## 2014-07-04 NOTE — Progress Notes (Signed)
Fasting: 85-99 (all but 2 elevated) 2hr PP: 87-149 (2 elevated) Start metfromin 500mg  at dinner US shows CPAM and 2 VC. F/u US in 2 weeks RTC in 2 weeks. FHT normal.

## 2014-07-04 NOTE — Patient Instructions (Signed)
Segundo trimestre de embarazo (Second Trimester of Pregnancy) El segundo trimestre va desde la semana13 hasta la 28, desde el cuarto hasta el sexto mes, y suele ser el momento en el que mejor se siente. Su organismo se ha adaptado a estar embarazada y comienza a sentirse fsicamente mejor. En general, las nuseas matutinas han disminuido o han desaparecido completamente, p El segundo trimestre es tambin la poca en la que el feto se desarrolla rpidamente. Hacia el final del sexto mes, el feto mide aproximadamente 9pulgadas (23cm) y pesa alrededor de 1 libras (700g). Es probable que sienta que el beb se mueve (da pataditas) entre las 18 y 20semanas del embarazo. CAMBIOS EN EL ORGANISMO Su organismo atraviesa por muchos cambios durante el embarazo, y estos varan de una mujer a otra.   Seguir aumentando de peso. Notar que la parte baja del abdomen sobresale.  Podrn aparecer las primeras estras en las caderas, el abdomen y las mamas.  Es posible que tenga dolores de cabeza que pueden aliviarse con los medicamentos que su mdico autorice.  Tal vez tenga necesidad de orinar con ms frecuencia porque el feto est ejerciendo presin sobre la vejiga.  Debido al embarazo podr sentir acidez estomacal con frecuencia.  Puede estar estreida, ya que ciertas hormonas enlentecen los movimientos de los msculos que empujan los desechos a travs de los intestinos.  Pueden aparecer hemorroides o abultarse e hincharse las venas (venas varicosas).  Puede tener dolor de espalda que se debe al aumento de peso y a que las hormonas del embarazo relajan las articulaciones entre los huesos de la pelvis, y como consecuencia de la modificacin del peso y los msculos que mantienen el equilibrio.  Las mamas seguirn creciendo y le dolern.  Las encas pueden sangrar y estar sensibles al cepillado y al hilo dental.  Pueden aparecer zonas oscuras o manchas (cloasma, mscara del embarazo) en el rostro que  probablemente se atenuarn despus del nacimiento del beb.  Es posible que se forme una lnea oscura desde el ombligo hasta la zona del pubis (linea nigra) que probablemente se atenuarn despus del nacimiento del beb.  Tal vez haya cambios en el cabello que pueden incluir su engrosamiento, crecimiento rpido y cambios en la textura. Adems, a algunas mujeres se les cae el cabello durante o despus del embarazo, o tienen el cabello seco o fino. Lo ms probable es que el cabello se le normalice despus del nacimiento del beb. QU DEBE ESPERAR EN LAS CONSULTAS PRENATALES Durante una visita prenatal de rutina:  La pesarn para asegurarse de que usted y el feto estn creciendo normalmente.  Le tomarn la presin arterial.  Le medirn el abdomen para controlar el desarrollo del beb.  Se escucharn los latidos cardacos fetales.  Se evaluarn los resultados de los estudios solicitados en visitas anteriores. El mdico puede preguntarle lo siguiente:  Cmo se siente.  Si siente los movimientos del beb.  Si ha tenido sntomas anormales, como prdida de lquido, sangrado, dolores de cabeza intensos o clicos abdominales.  Si tiene alguna pregunta. Otros estudios que podrn realizarse durante el segundo trimestre incluyen lo siguiente:  Anlisis de sangre para detectar:  Concentraciones de hierro bajas (anemia).  Diabetes gestacional (entre la semana 24 y la 28).  Anticuerpos Rh.  Anlisis de orina para detectar infecciones, diabetes o protenas en la orina.  Una ecografa para confirmar que el beb crece y se desarrolla correctamente.  Una amniocentesis para diagnosticar posibles problemas genticos.  Estudios del feto para descartar espina   bfida y sndrome de Down. INSTRUCCIONES PARA EL CUIDADO EN EL HOGAR   Evite fumar, consumir hierbas, beber alcohol y tomar frmacos que no le hayan recetado. Estas sustancias qumicas afectan la formacin y el desarrollo del beb.  Siga  las indicaciones del mdico en relacin con el uso de medicamentos. Durante el embarazo, hay medicamentos que son seguros de tomar y otros que no.  Haga actividad fsica solo en la forma indicada por el mdico. Sentir clicos uterinos es un buen signo para detener la actividad fsica.  Contine comiendo alimentos que sanos con regularidad.  Use un sostn que le brinde buen soporte si le duelen las mamas.  No se d baos de inmersin en agua caliente, baos turcos ni saunas.  Colquese el cinturn de seguridad cuando conduzca.  No coma carne cruda ni queso sin cocinar; evite el contacto con las bandejas sanitarias de los gatos y la tierra que estos animales usan. Estos elementos contienen grmenes que pueden causar defectos congnitos en el beb.  Tome las vitaminas prenatales.  Si est estreida, pruebe un laxante suave (si el mdico lo autoriza). Consuma ms alimentos ricos en fibra, como vegetales y frutas frescos y cereales integrales. Beba gran cantidad de lquido para mantener la orina de tono claro o color amarillo plido.  Dese baos de asiento con agua tibia para aliviar el dolor o las molestias causadas por las hemorroides. Use una crema para las hemorroides si el mdico la autoriza.  Si tiene venas varicosas, use medias de descanso. Eleve los pies durante 15minutos, 3 o 4veces por da. Limite la cantidad de sal en su dieta.  No levante objetos pesados, use zapatos de tacones bajos y mantenga una buena postura.  Descanse con las piernas elevadas si tiene calambres o dolor de cintura.  Visite a su dentista si an no lo ha hecho durante el embarazo. Use un cepillo de dientes blando para higienizarse los dientes y psese el hilo dental con suavidad.  Puede seguir manteniendo relaciones sexuales, a menos que el mdico le indique lo contrario.  Concurra a todas las visitas prenatales segn las indicaciones de su mdico. SOLICITE ATENCIN MDICA SI:   Tiene mareos.  Siente  clicos leves, presin en la pelvis o dolor persistente en el abdomen.  Tiene nuseas, vmitos o diarrea persistentes.  Tiene secrecin vaginal con mal olor.  Siente dolor al orinar. SOLICITE ATENCIN MDICA DE INMEDIATO SI:   Tiene fiebre.  Tiene una prdida de lquido por la vagina.  Tiene sangrado o pequeas prdidas vaginales.  Siente dolor intenso o clicos en el abdomen.  Sube o baja de peso rpidamente.  Tiene dificultad para respirar y siente dolor de pecho.  Sbitamente se le hinchan mucho el rostro, las manos, los tobillos, los pies o las piernas.  No ha sentido los movimientos del beb durante una hora.  Siente un dolor de cabeza intenso que no se alivia con medicamentos.  Hay cambios en la visin. Document Released: 10/10/2004 Document Revised: 01/05/2013 ExitCare Patient Information 2015 Exit[employer]. This information is not intended to replace advice given to you by your health care provider. Make sure you discuss any questions you have with your health care provider.  

## 2014-07-04 NOTE — Progress Notes (Signed)
Nile Riggs used as interpreter today/.  Ketones present in urine.

## 2014-07-13 ENCOUNTER — Other Ambulatory Visit (HOSPITAL_COMMUNITY): Payer: Self-pay | Admitting: Maternal and Fetal Medicine

## 2014-07-13 DIAGNOSIS — O280 Abnormal hematological finding on antenatal screening of mother: Secondary | ICD-10-CM

## 2014-07-13 DIAGNOSIS — O09522 Supervision of elderly multigravida, second trimester: Secondary | ICD-10-CM

## 2014-07-13 DIAGNOSIS — O34219 Maternal care for unspecified type scar from previous cesarean delivery: Secondary | ICD-10-CM

## 2014-07-13 DIAGNOSIS — Z8751 Personal history of pre-term labor: Secondary | ICD-10-CM

## 2014-07-13 DIAGNOSIS — O24312 Unspecified pre-existing diabetes mellitus in pregnancy, second trimester: Secondary | ICD-10-CM

## 2014-07-13 DIAGNOSIS — O359XX0 Maternal care for (suspected) fetal abnormality and damage, unspecified, not applicable or unspecified: Secondary | ICD-10-CM

## 2014-07-15 ENCOUNTER — Ambulatory Visit (HOSPITAL_COMMUNITY)
Admission: RE | Admit: 2014-07-15 | Discharge: 2014-07-15 | Disposition: A | Discharge status: Home / Self Care | Payer: Self-pay | Source: Ambulatory Visit | Attending: Obstetrics & Gynecology | Admitting: Obstetrics & Gynecology

## 2014-07-15 ENCOUNTER — Other Ambulatory Visit (HOSPITAL_COMMUNITY): Payer: Self-pay | Admitting: Maternal and Fetal Medicine

## 2014-07-15 ENCOUNTER — Encounter (HOSPITAL_COMMUNITY): Payer: Self-pay

## 2014-07-15 DIAGNOSIS — O3421 Maternal care for scar from previous cesarean delivery: Secondary | ICD-10-CM | POA: Insufficient documentation

## 2014-07-15 DIAGNOSIS — O280 Abnormal hematological finding on antenatal screening of mother: Secondary | ICD-10-CM

## 2014-07-15 DIAGNOSIS — O09212 Supervision of pregnancy with history of pre-term labor, second trimester: Secondary | ICD-10-CM | POA: Insufficient documentation

## 2014-07-15 DIAGNOSIS — O09522 Supervision of elderly multigravida, second trimester: Secondary | ICD-10-CM | POA: Insufficient documentation

## 2014-07-15 DIAGNOSIS — Z8751 Personal history of pre-term labor: Secondary | ICD-10-CM

## 2014-07-15 DIAGNOSIS — O24312 Unspecified pre-existing diabetes mellitus in pregnancy, second trimester: Secondary | ICD-10-CM

## 2014-07-15 DIAGNOSIS — O26899 Other specified pregnancy related conditions, unspecified trimester: Secondary | ICD-10-CM | POA: Insufficient documentation

## 2014-07-15 DIAGNOSIS — O34219 Maternal care for unspecified type scar from previous cesarean delivery: Secondary | ICD-10-CM

## 2014-07-15 DIAGNOSIS — O283 Abnormal ultrasonic finding on antenatal screening of mother: Secondary | ICD-10-CM | POA: Insufficient documentation

## 2014-07-15 DIAGNOSIS — Z3A25 25 weeks gestation of pregnancy: Secondary | ICD-10-CM | POA: Insufficient documentation

## 2014-07-15 DIAGNOSIS — O359XX0 Maternal care for (suspected) fetal abnormality and damage, unspecified, not applicable or unspecified: Secondary | ICD-10-CM | POA: Insufficient documentation

## 2014-07-15 DIAGNOSIS — O358XX0 Maternal care for other (suspected) fetal abnormality and damage, not applicable or unspecified: Secondary | ICD-10-CM | POA: Insufficient documentation

## 2014-07-15 DIAGNOSIS — R109 Unspecified abdominal pain: Secondary | ICD-10-CM

## 2014-07-15 NOTE — ED Notes (Signed)
Pt reports lower abd pain and tightening that comes and goes and vaginal itching and burning.

## 2014-07-19 ENCOUNTER — Encounter: Payer: Self-pay | Admitting: Obstetrics & Gynecology

## 2014-07-19 ENCOUNTER — Ambulatory Visit (INDEPENDENT_AMBULATORY_CARE_PROVIDER_SITE_OTHER): Payer: Self-pay | Admitting: Obstetrics & Gynecology

## 2014-07-19 VITALS — BP 118/66 | HR 97 | Temp 98.4°F | Wt 251.5 lb

## 2014-07-19 DIAGNOSIS — O26892 Other specified pregnancy related conditions, second trimester: Secondary | ICD-10-CM

## 2014-07-19 DIAGNOSIS — O26893 Other specified pregnancy related conditions, third trimester: Secondary | ICD-10-CM

## 2014-07-19 DIAGNOSIS — N898 Other specified noninflammatory disorders of vagina: Secondary | ICD-10-CM

## 2014-07-19 DIAGNOSIS — Z3492 Encounter for supervision of normal pregnancy, unspecified, second trimester: Secondary | ICD-10-CM

## 2014-07-19 DIAGNOSIS — O0992 Supervision of high risk pregnancy, unspecified, second trimester: Secondary | ICD-10-CM

## 2014-07-19 DIAGNOSIS — Z23 Encounter for immunization: Secondary | ICD-10-CM

## 2014-07-19 LAB — POCT URINALYSIS DIP (DEVICE)
Bilirubin Urine: NEGATIVE
GLUCOSE, UA: NEGATIVE mg/dL
Ketones, ur: NEGATIVE mg/dL
LEUKOCYTES UA: NEGATIVE
Nitrite: NEGATIVE
Protein, ur: NEGATIVE mg/dL
Specific Gravity, Urine: 1.02 (ref 1.005–1.030)
UROBILINOGEN UA: 0.2 mg/dL (ref 0.0–1.0)
pH: 6 (ref 5.0–8.0)

## 2014-07-19 MED ORDER — TETANUS-DIPHTH-ACELL PERTUSSIS 5-2.5-18.5 LF-MCG/0.5 IM SUSP
0.5000 mL | Freq: Once | INTRAMUSCULAR | Status: AC
  Administered 2014-07-19: 0.5 mL via INTRAMUSCULAR

## 2014-07-19 NOTE — Progress Notes (Signed)
Subjective:  Kayla Scott is a 39 y.o. [id number] at 345w0d being seen today for ongoing prenatal care.  Patient reports c/o greenish discharge with itching.  Contractions: Not present.   . Movement: Present. Denies leaking of fluid.  Pt reports that her GLC has all been under 120 at all times.    The following portions of the patient's history were reviewed and updated as appropriate: allergies, current medications, past family history, past medical history, past social history, past surgical history and problem list.   Objective:   Filed Vitals:   07/19/14 1307  BP: 118/66  Pulse: 97  Temp: 98.4 F (36.9 C)  Weight: 251 lb 8 oz (114.08 kg)    Fetal Status: Fetal Heart Rate (bpm): 140 Fundal Height: 31 cm Movement: Present     General:  Alert, oriented and cooperative. Patient is in no acute distress.  Skin: Skin is warm and dry. No rash noted.   Cardiovascular: Normal heart rate noted  Respiratory: Normal respiratory effort, no problems with respiration noted  Abdomen: Soft, gravid, appropriate for gestational age. Pain/Pressure: Absent     Vaginal:  .    Vag D/C Character: Other (Comment)  Cervix: visually long and closed        Extremities: Normal range of motion.  Edema: Trace  Mental Status: Normal mood and affect. Normal behavior. Normal judgment and thought content.   Urinalysis: Urine Protein: Negative Urine Glucose: Negative  Assessment and Plan:  Pregnancy: [id number] at 7645w0d  1. Prenatal care in second trimester  - CBC - RPR - HIV antibody (with reflex)  2. Need for Tdap vaccination  - Tdap (BOOSTRIX) injection 0.5 mL; Inject 0.5 mLs into the muscle once.  3. Vaginal discharge during pregnancy, third trimester F/u wet prep  4. Diabetes in pregnancy- pt did not bring GLC log because she says that noone ever checks it. Pt encouraged to bring her blc log to each visit.    5. Abnormal sono- has repeat sono scheduled in 2 weeks   Preterm labor symptoms  and general obstetric precautions including but not limited to vaginal bleeding, contractions, leaking of fluid and fetal movement were reviewed in detail with the patient.  Please refer to After Visit Summary for other counseling recommendations.   F/u in 2 weeks with GLC log   Willodean Rosenthalarolyn Harraway-Smith, MD

## 2014-07-19 NOTE — Patient Instructions (Signed)
Diabetes mellitus tipo 1 o tipo 2 durante el embarazo (Type 1 or Type 2 Diabetes Mellitus During Pregnancy) La diabetes mellitus, generalmente denominada diabetes, es una enfermedad prolongada (crnica). La diabetes tipo1 ocurre cuando las clulas de los islotes, que estn en el pncreas y producen la hormona insulina, se destruyen y ya no pueden producir insulina. La diabetes tipo2 ocurre cuando el pncreas no produce suficiente insulina, las clulas son menos sensibles a la insulina que se produce (resistencia a la insulina), o ambos. La insulina es necesaria para movilizar los azcares de los alimentos a las clulas de los tejidos. Las clulas de los tejidos utilizan los azcares para obtener energa. La falta de insulina o la falta de una respuesta normal a la insulina hace que el exceso de azcar se acumule en la sangre en lugar de penetrar en las clulas de los tejidos. Como resultado, se producen niveles altos de azcar en la sangre (hiperglucemia).  Si se mantienen los niveles de glucosa en la sangre en un rango normal antes y durante el embarazo, las mujeres pueden tener un embarazo saludable. Si no se controlan los niveles de glucosa en la sangre adecuadamente, puede haber riesgos para usted, para el beb que no ha nacido y durante el trabajo de parto y el parto. Tambin puede haber riesgos para el beb cuando nazca.  FACTORES DE RIESGO   Una persona est predispuesta a desarrollar diabetes tipo 1 si alguien en su familia padece la enfermedad y se expone a ciertos desencadenantes ambientales adicionales.  Tiene una mayor probabilidad de desarrollar diabetes tipo 2 si tiene antecedentes familiares de diabetes y tambin tiene uno o ms de los siguientes factores de riesgo:  Tener sobrepeso.  Tiene un estilo de vida sedentario.  Ha consumido, a lo largo de toda su vida, una gran cantidad de alimentos con muchas caloras. SNTOMAS  Aumento de la sed (polidipsia).  Aumento de la miccin  (poliuria).  Orina con ms frecuencia durante la noche (nocturia).  Prdida de peso. La prdida de peso puede ser muy rpida.  Infecciones frecuentes y recurrentes.  Cansancio (fatiga).  Debilidad.  Cambios en la visin, como visin borrosa.  Olor a fruta en el aliento.  Dolor abdominal.  Nuseas o vmitos. DIAGNSTICO  La diabetes se diagnostica cuando hay aumento de los niveles de glucosa en la sangre. El nivel de glucosa en la sangre puede controlarse en uno o ms de los siguientes anlisis de sangre:  Medicin de glucosa en la sangre en ayunas. No se le permitir comer durante al menos 8 horas antes de que se tome una muestra de sangre.  Pruebas al azar de glucosa en la sangre. El nivel de glucosa en la sangre se controla en cualquier momento del da sin importar el momento en que haya comido.  Prueba de A1c (hemoglobina glucosilada) Una prueba de A1c proporciona informacin sobre el control de la glucosa en la sangre durante los ltimos 3 meses.  Prueba de tolerancia a la glucosa oral (PTGO). La glucosa en la sangre se mide despus de no haber comido (ayunas) durante una a tres horas y despus de beber una bebida que contenga glucosa. La prueba de tolerancia a la glucosa oral se realiza durante las semanas 24 a 28 del embarazo. TRATAMIENTO   Usted tendr que tomar medicamentos para la diabetes o insulina diariamente para mantener los niveles de glucosa en la sangre en el rango deseado.  Usted tendr que combinar la dosis de insulina con la actividad fsica y   la eleccin de alimentos saludables. El objetivo del tratamiento es mantener el nivel de azcar en la sangre previo a comer (preprandial) y durante la noche entre 60 y 99mg/dl, durante todo el embarazo. El objetivo del tratamiento es mantener el mayor nivel de azcar en la sangre despus de comer (glucosa pospandial) entre 100 y 140mg/dl.  INSTRUCCIONES PARA EL CUIDADO EN EL HOGAR   Controle su nivel de hemoglobina A1c  dos veces al ao.  Contrlese a diario el nivel de glucosa en la sangre segn las indicaciones de su mdico. Es comn realizar controles frecuentes de la glucosa en la sangre.  Supervise las cetonas en la orina cuando est enfermo y segn las indicaciones de su mdico.  Tome el medicamento para la diabetes y adminstrese insulina segn las indicaciones de su mdico para mantener el nivel de glucosa en la sangre en el rango deseado.  Nunca se quede sin medicamento para la diabetes o sin insulina. Es necesario que la reciba todos los das.  Ajuste la insulina segn la ingesta de hidratos de carbono. Los hidratos de carbono pueden aumentar los niveles de glucosa en la sangre, pero deben incluirse en su dieta. Aportan vitaminas, minerales y fibra que son una parte esencial de una dieta saludable. Los hidratos de carbono se encuentran en frutas, verduras, cereales integrales, productos lcteos, legumbres y alimentos que contienen azcares aadidos.  Consuma alimentos saludables. Alterne 3 comidas con 3 colaciones.  Aumente de peso saludablemente. El aumento del peso total vara de acuerdo con el ndice de masa corporal que tena antes del embarazo (IMC).  Lleve una tarjeta de alerta mdica o use un brazalete o medalla de alerta mdica.  Lleve con usted una colacin de 15gramos de carbohidratos en todo momento para controlar los niveles bajos de glucosa en la sangre (hipoglucemia). Algunos ejemplos de colaciones de 15gramos de hidratos de carbono son los siguientes:  Tabletas de glucosa, 3 o 4.  Gel de glucosa, tubo de 15 gramos.  Pasas de uva, 2cucharadas (24gramos).  Caramelos de goma, 6.  Galletas de animales, 8.  Jugo de fruta, gaseosa comn, o leche descremada, 4 onzas (120 ml).  Pastillas de goma, 9.  Reconocer la hipoglucemia. Durante el embarazo la hipoglucemia se produce cuando hay niveles de glucosa en la sangre de 60 mg/dl o menos. El riesgo de hipoglucemia aumenta durante  el ayuno o cuando se saltea las comidas, durante o despus de realizar ejercicio intenso y mientras duerme. Los sntomas de hipoglucemia son:  Temblores o sacudidas.  Disminucin de la capacidad de concentracin.  Sudoracin.  Aumento de la frecuencia cardaca.  Dolor de cabeza.  Sequedad en la boca.  Hambre.  Irritabilidad.  Ansiedad.  Sueo agitado.  Alteracin del habla o de la coordinacin.  Confusin.  Tratar la hipoglucemia rpidamente. Si usted est alerta y puede tragar con seguridad, siga la regla de 15/15 que consiste en:  Tome entre 15 y 20gramos de glucosa de accin rpida o carbohidratos. Las opciones de accin rpida son un gel de glucosa, tabletas de glucosa, o 4 onzas (120 ml) de jugo de frutas, gaseosa comn, o leche baja en grasa.  Compruebe su nivel de glucosa en la sangre 15 minutos despus de tomar la glucosa.  Tome entre 15 y 20gramos ms de glucosa si el nivel de glucosa en la sangre todava es de 70mg/dl o inferior.  Ingiera una comida o una colacin en el lapso de 1 hora una vez que los niveles de glucosa en la sangre   vuelven a la normalidad.  Haga actividad fsica por lo menos 30minutos al da o como lo indique su mdico. Se recomienda que 30 minutos despus de cada comida, realice diez minutos de actividad fsica para controlar los niveles de glucosa postprandial en la sangre.  Est alerta a la poliuria (miccin excesiva) y la polidipsia (sensacin de mucha sed), que son los primeros signos de la hiperglucemia. El reconocimiento temprano de la hiperglucemia permite un tratamiento oportuno. Trate la hiperglucemia segn le indic su mdico.  Ajuste su dosis de insulina y la ingesta de alimentos, segn sea necesario, si inicia un nuevo ejercicio o deporte.  Siga su plan para los das de enfermedad cuando no puede comer o beber como de costumbre.  Evite el tabaco y el alcohol.  Concurra a todas las visitas de control como se lo haya indicado el  mdico.  Siga el consejo del mdico respecto a los controles prenatales y posteriores al parto (postparto), las visitas, la planificacin de las comidas, el ejercicio, los medicamentos, las vitaminas, los anlisis de sangre, otras pruebas mdicas y actividades fsicas.  Cuide diariamente la piel y los pies. Examine su piel y los pies diariamente para ver si tiene cortes, moretones, enrojecimiento, problemas en las uas, sangrado, ampollas o llagas. Su mdico debe hacerle un examen de los pies una vez por ao.  Cepllese los dientes y encas por lo menos dos veces al da y use hilo dental al menos una vez por da. Concurra regularmente a las visitas de control con el dentista.  Programe un examen de vista durante el primer trimestre de su embarazo o como lo indique su mdico.  Comparta su plan de control de diabetes en el trabajo o en la escuela.  Mantngase al da con las vacunas.  Aprenda a manejar el estrs.  Obtenga la mayor cantidad posible de informacin sobre la diabetes y solicite ayuda siempre que sea necesario.  Su mdico puede recomendarle que tome una aspirina de dosis baja (81mg) cada da a fin de ayudar a prevenir la hipertensin durante el embarazo (preeclampsia o eclampsia). Puede estar en riesgo de padecer preeclampsia o eclampsia si:  Padeci preeclampsia o eclampsia durante un embarazo anterior.  Su beb no creci segn lo previsto durante un embarazo anterior.  Tuvo un parto prematuro en un embarazo anterior.  Experiment una separacin de la placenta desde el tero (desprendimiento abrupto de la placenta) durante un embarazo anterior.  Perdi un beb en un embarazo anterior.  Est embarazada de ms de un beb.  Padece otras afecciones mdicas, como hipertensin arterial o una enfermedad autoinmunitaria. SOLICITE ATENCIN MDICA SI:   No puede comer alimentos o beber por ms de 6 horas.  Tuvo nuseas o ha vomitado durante ms de 6 horas.  Tiene un nivel de  glucosa en la sangre de 200 mg/dl y cetonas en la orina.  Presenta algn cambio en el estado mental.  Desarrolla problemas de visin.  Sufre un dolor persistente de cabeza.  Siente dolor o molestias en la parte superior del abdomen.  Tiene una enfermedad grave adicional.  Tuvo diarrea durante ms de 6 horas.  Ha estado enfermo o ha tenido fiebre durante 2 das y no mejora. SOLICITE ATENCIN MDICA DE INMEDIATO SI:  Tiene dificultad para respirar.  Ya no siente los movimientos del beb.  Est sangrando o tiene flujo vaginal.  Comienza a tener contracciones o trabajo de parto prematuro. ASEGRESE DE QUE:  Comprende estas instrucciones.  Controlar su afeccin.  Recibir ayuda de   inmediato si no mejora o si empeora. Document Released: 09/25/2011 Document Revised: 05/17/2013 [employer] Patient Information 2015 [employer]. This information is not intended to replace advice given to you by your health care provider. Make sure you discuss any questions you have with your health care provider.  

## 2014-07-19 NOTE — Progress Notes (Signed)
Breastfeeding tip of the week reviewed 28 wk labs Ketones: 80, Hgb: trace Spanish Interpreter Mariel Gallego Pt describes itchy discharge, green Pt has stopped taking Metformin due to issue with throat Am: 88 after breakfast: 96, after lunch:100, after dinner: 120( 07/18/14)

## 2014-07-20 ENCOUNTER — Other Ambulatory Visit: Payer: Self-pay | Admitting: Obstetrics & Gynecology

## 2014-07-20 DIAGNOSIS — N760 Acute vaginitis: Principal | ICD-10-CM

## 2014-07-20 DIAGNOSIS — B9689 Other specified bacterial agents as the cause of diseases classified elsewhere: Secondary | ICD-10-CM

## 2014-07-20 LAB — WET PREP, GENITAL
CLUE CELLS WET PREP: NONE SEEN
TRICH WET PREP: NONE SEEN
YEAST WET PREP: NONE SEEN

## 2014-07-20 LAB — CBC
HCT: 35.7 % — ABNORMAL LOW (ref 36.0–46.0)
Hemoglobin: 12.1 g/dL (ref 12.0–15.0)
MCH: 32.2 pg (ref 26.0–34.0)
MCHC: 33.9 g/dL (ref 30.0–36.0)
MCV: 94.9 fL (ref 78.0–100.0)
MPV: 10.3 fL (ref 8.6–12.4)
Platelets: 294 10*3/uL (ref 150–400)
RBC: 3.76 MIL/uL — ABNORMAL LOW (ref 3.87–5.11)
RDW: 14.6 % (ref 11.5–15.5)
WBC: 10.5 10*3/uL (ref 4.0–10.5)

## 2014-07-20 LAB — RPR

## 2014-07-20 LAB — HIV ANTIBODY (ROUTINE TESTING W REFLEX): HIV: NONREACTIVE

## 2014-07-20 MED ORDER — METRONIDAZOLE 500 MG PO TABS: 500.0000 mg | ORAL_TABLET | Freq: Two times a day (BID) | ORAL | Status: DC

## 2014-07-21 NOTE — Progress Notes (Signed)
7/7  1505  Reviewed wet prep result w/Dr. Erin FullingHarraway- Smith. Treatment for BV is not indicated - Rx discontinued, [facility] pharmacist notified. Pt had not been notified of test results or Rx prescribed, therefore no call to her is indicated at this time.

## 2014-07-21 NOTE — Addendum Note (Signed)
Addended by: Jill SideAY, Tamella Tuccillo L on: 07/21/2014 03:23 PM   Modules accepted: Orders, Medications

## 2014-07-29 ENCOUNTER — Ambulatory Visit (HOSPITAL_COMMUNITY): Payer: Self-pay

## 2014-08-01 ENCOUNTER — Encounter: Payer: Self-pay | Admitting: Obstetrics & Gynecology

## 2014-08-18 ENCOUNTER — Telehealth (HOSPITAL_COMMUNITY): Payer: Self-pay | Admitting: MS"

## 2014-08-18 NOTE — Telephone Encounter (Signed)
Spoke with patient's spouse via telephonic interpreter, Mikle Bosworth [phone]). Patient's last ultrasound was 7/5, and she cancelled her follow-up visit for 7/15. Patient has no follow-up ultrasound visits scheduled at this time. Explained that I was calling to reschedule. Mr. Cheryln Manly explained that they are having a hard time with all of the bills from the appointments but that he understands the importance of following the baby on the ultrasound. He expressed that it was OK to go ahead an schedule a follow-up ultrasound and that he would make sure patient is at the appointment. Scheduled Ms. Dominguez-Urieta for follow-up ultrasound tomorrow (8/5) at 11:15. Also reviewed with Mr. Cheryln Manly that the patient has the option to meet prenatally with pediatric surgeon from [facility] in [place], but that this is an optional visit. Discussed that we can review this option in more detail tomorrow. Patient is currently scheduled to meet with peds surgeon on 8/16.    Clydie Braun Jamesen Stahnke 08/18/2014

## 2014-08-19 ENCOUNTER — Ambulatory Visit (HOSPITAL_COMMUNITY)
Admission: RE | Admit: 2014-08-19 | Discharge: 2014-08-19 | Disposition: A | Discharge status: Home / Self Care | Payer: Medicaid Other | Source: Ambulatory Visit | Attending: Maternal and Fetal Medicine | Admitting: Maternal and Fetal Medicine

## 2014-08-19 DIAGNOSIS — Z8751 Personal history of pre-term labor: Secondary | ICD-10-CM

## 2014-08-19 DIAGNOSIS — O283 Abnormal ultrasonic finding on antenatal screening of mother: Secondary | ICD-10-CM | POA: Insufficient documentation

## 2014-08-19 DIAGNOSIS — O359XX0 Maternal care for (suspected) fetal abnormality and damage, unspecified, not applicable or unspecified: Secondary | ICD-10-CM

## 2014-08-19 DIAGNOSIS — O280 Abnormal hematological finding on antenatal screening of mother: Secondary | ICD-10-CM

## 2014-08-19 DIAGNOSIS — O34219 Maternal care for unspecified type scar from previous cesarean delivery: Secondary | ICD-10-CM

## 2014-08-19 DIAGNOSIS — O24312 Unspecified pre-existing diabetes mellitus in pregnancy, second trimester: Secondary | ICD-10-CM | POA: Insufficient documentation

## 2014-08-19 DIAGNOSIS — O3421 Maternal care for scar from previous cesarean delivery: Secondary | ICD-10-CM | POA: Insufficient documentation

## 2014-08-19 DIAGNOSIS — O09522 Supervision of elderly multigravida, second trimester: Secondary | ICD-10-CM | POA: Insufficient documentation

## 2014-08-29 ENCOUNTER — Ambulatory Visit (INDEPENDENT_AMBULATORY_CARE_PROVIDER_SITE_OTHER): Payer: Self-pay | Admitting: Obstetrics and Gynecology

## 2014-08-29 ENCOUNTER — Encounter: Payer: Self-pay | Admitting: *Deleted

## 2014-08-29 VITALS — BP 106/49 | HR 112 | Temp 98.2°F | Wt 251.7 lb

## 2014-08-29 DIAGNOSIS — O09523 Supervision of elderly multigravida, third trimester: Secondary | ICD-10-CM

## 2014-08-29 DIAGNOSIS — O24419 Gestational diabetes mellitus in pregnancy, unspecified control: Secondary | ICD-10-CM

## 2014-08-29 LAB — POCT URINALYSIS DIP (DEVICE)
BILIRUBIN URINE: NEGATIVE
Glucose, UA: NEGATIVE mg/dL
Ketones, ur: NEGATIVE mg/dL
Nitrite: NEGATIVE
PH: 6 (ref 5.0–8.0)
Protein, ur: NEGATIVE mg/dL
Specific Gravity, Urine: 1.01 (ref 1.005–1.030)
UROBILINOGEN UA: 0.2 mg/dL (ref 0.0–1.0)

## 2014-08-29 LAB — HEMOGLOBIN A1C
Hgb A1c MFr Bld: 5.6 % (ref <5.7)
Mean Plasma Glucose: 114 mg/dL (ref <117)

## 2014-08-29 NOTE — Progress Notes (Signed)
Opthalmology appointment scheduled for 10/03/2014@10 :30AM

## 2014-08-29 NOTE — Progress Notes (Signed)
Interpreter Clent Ridges  Pain- lower abd

## 2014-08-29 NOTE — Progress Notes (Signed)
Subjective:  Kayla Scott is a 39 y.o. [id number] at [redacted]w[redacted]d being seen today for ongoing prenatal care.  Patient reports no complaints. Has not been checking sugars, has not been taking metformin. Has been working to make dietary changes.   Contractions: Not present.   . Movement: Present. Denies leaking of fluid.   The following portions of the patient's history were reviewed and updated as appropriate: allergies, current medications, past family history, past medical history, past social history, past surgical history and problem list.   Objective:   Filed Vitals:   08/29/14 0811  BP: 106/49  Pulse: 112  Temp: 98.2 F (36.8 C)  Weight: 251 lb 11.2 oz (114.17 kg)    Fetal Status: Fetal Heart Rate (bpm): 137 Fundal Height: 38 cm Movement: Present     General:  Alert, oriented and cooperative. Patient is in no acute distress.  Skin: Skin is warm and dry. No rash noted.   Cardiovascular: Normal heart rate noted  Respiratory: Normal respiratory effort, no problems with respiration noted  Abdomen: Soft, gravid, appropriate for gestational age. Pain/Pressure: Present     Pelvic:       Cervical exam deferred        Extremities: Normal range of motion.  Edema: None  Mental Status: Normal mood and affect. Normal behavior. Normal judgment and thought content.   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 Please refer to After Visit Summary for other counseling recommendations.  Return in about 4 days (around 09/02/2014).   Kathrynn Running, MD

## 2014-08-30 ENCOUNTER — Other Ambulatory Visit (HOSPITAL_COMMUNITY): Payer: Self-pay | Admitting: Obstetrics and Gynecology

## 2014-08-30 DIAGNOSIS — Z3A32 32 weeks gestation of pregnancy: Secondary | ICD-10-CM

## 2014-08-30 DIAGNOSIS — O359XX0 Maternal care for (suspected) fetal abnormality and damage, unspecified, not applicable or unspecified: Secondary | ICD-10-CM

## 2014-08-30 DIAGNOSIS — O09523 Supervision of elderly multigravida, third trimester: Secondary | ICD-10-CM

## 2014-08-30 DIAGNOSIS — O24414 Gestational diabetes mellitus in pregnancy, insulin controlled: Secondary | ICD-10-CM

## 2014-08-30 DIAGNOSIS — O34219 Maternal care for unspecified type scar from previous cesarean delivery: Secondary | ICD-10-CM

## 2014-08-30 DIAGNOSIS — Z8751 Personal history of pre-term labor: Secondary | ICD-10-CM

## 2014-09-02 ENCOUNTER — Ambulatory Visit (HOSPITAL_COMMUNITY)
Admission: RE | Admit: 2014-09-02 | Discharge: 2014-09-02 | Disposition: A | Discharge status: Home / Self Care | Payer: Self-pay | Source: Ambulatory Visit | Attending: Family | Admitting: Family

## 2014-09-02 ENCOUNTER — Ambulatory Visit (INDEPENDENT_AMBULATORY_CARE_PROVIDER_SITE_OTHER): Payer: Self-pay | Admitting: *Deleted

## 2014-09-02 ENCOUNTER — Encounter (HOSPITAL_COMMUNITY): Payer: Self-pay

## 2014-09-02 ENCOUNTER — Encounter: Payer: Self-pay | Admitting: *Deleted

## 2014-09-02 DIAGNOSIS — O24419 Gestational diabetes mellitus in pregnancy, unspecified control: Secondary | ICD-10-CM

## 2014-09-02 DIAGNOSIS — O359XX0 Maternal care for (suspected) fetal abnormality and damage, unspecified, not applicable or unspecified: Secondary | ICD-10-CM

## 2014-09-02 DIAGNOSIS — O09523 Supervision of elderly multigravida, third trimester: Secondary | ICD-10-CM

## 2014-09-02 DIAGNOSIS — Z3A32 32 weeks gestation of pregnancy: Secondary | ICD-10-CM

## 2014-09-02 DIAGNOSIS — O358XX0 Maternal care for other (suspected) fetal abnormality and damage, not applicable or unspecified: Secondary | ICD-10-CM | POA: Insufficient documentation

## 2014-09-02 DIAGNOSIS — O24414 Gestational diabetes mellitus in pregnancy, insulin controlled: Secondary | ICD-10-CM

## 2014-09-02 DIAGNOSIS — Z8751 Personal history of pre-term labor: Secondary | ICD-10-CM

## 2014-09-02 DIAGNOSIS — O3421 Maternal care for scar from previous cesarean delivery: Secondary | ICD-10-CM | POA: Insufficient documentation

## 2014-09-02 DIAGNOSIS — O34219 Maternal care for unspecified type scar from previous cesarean delivery: Secondary | ICD-10-CM

## 2014-09-02 NOTE — ED Notes (Signed)
Pt reports leaking small amt of clear fluid x 1 month.

## 2014-09-02 NOTE — Progress Notes (Signed)
NST reviewed and reactive.  

## 2014-09-05 ENCOUNTER — Ambulatory Visit (INDEPENDENT_AMBULATORY_CARE_PROVIDER_SITE_OTHER): Payer: Self-pay | Admitting: Family Medicine

## 2014-09-05 VITALS — BP 104/54 | HR 102 | Wt 253.7 lb

## 2014-09-05 DIAGNOSIS — O34219 Maternal care for unspecified type scar from previous cesarean delivery: Secondary | ICD-10-CM

## 2014-09-05 DIAGNOSIS — Z23 Encounter for immunization: Secondary | ICD-10-CM

## 2014-09-05 DIAGNOSIS — O24419 Gestational diabetes mellitus in pregnancy, unspecified control: Secondary | ICD-10-CM

## 2014-09-05 DIAGNOSIS — O0993 Supervision of high risk pregnancy, unspecified, third trimester: Secondary | ICD-10-CM

## 2014-09-05 DIAGNOSIS — O0992 Supervision of high risk pregnancy, unspecified, second trimester: Secondary | ICD-10-CM

## 2014-09-05 DIAGNOSIS — O3421 Maternal care for scar from previous cesarean delivery: Secondary | ICD-10-CM

## 2014-09-05 LAB — POCT URINALYSIS DIP (DEVICE)
Bilirubin Urine: NEGATIVE
Glucose, UA: NEGATIVE mg/dL
KETONES UR: NEGATIVE mg/dL
NITRITE: NEGATIVE
Protein, ur: NEGATIVE mg/dL
Specific Gravity, Urine: 1.01 (ref 1.005–1.030)
Urobilinogen, UA: 0.2 mg/dL (ref 0.0–1.0)
pH: 6.5 (ref 5.0–8.0)

## 2014-09-05 MED ORDER — GLYBURIDE 2.5 MG PO TABS: 1.2500 mg | ORAL_TABLET | Freq: Two times a day (BID) | ORAL | Status: DC

## 2014-09-05 NOTE — Progress Notes (Signed)
Patient presents with c/o meter not reading strips. Notes that there was n o "chip" in the box of strips. Provided new box of strips, test glucose /dl. Working well.

## 2014-09-05 NOTE — Progress Notes (Signed)
Patient reports generally not feeling well today. Reports decreased FM since Saturday. Also reports pain in pelvis, legs, and feet

## 2014-09-05 NOTE — Patient Instructions (Signed)
Tercer trimestre de embarazo (Third Trimester of Pregnancy) El tercer trimestre va desde la semana29 hasta la 42, desde el sptimo hasta el noveno mes, y es la poca en la que el feto crece ms rpidamente. Hacia el final del noveno mes, el feto mide alrededor de 20pulgadas (45cm) de largo y pesa entre 6 y 10 libras (2,700 y 4,500kg).  CAMBIOS EN EL ORGANISMO Su organismo atraviesa por muchos cambios durante el embarazo, y estos varan de una mujer a otra.   Seguir aumentando de peso. Es de esperar que aumente entre 25 y 35libras (11 y 16kg) hacia el final del embarazo.  Podrn aparecer las primeras estras en las caderas, el abdomen y las mamas.  Puede tener necesidad de orinar con ms frecuencia porque el feto baja hacia la pelvis y ejerce presin sobre la vejiga.  Debido al embarazo podr sentir acidez estomacal con frecuencia.  Puede estar estreida, ya que ciertas hormonas enlentecen los movimientos de los msculos que empujan los desechos a travs de los intestinos.  Pueden aparecer hemorroides o abultarse e hincharse las venas (venas varicosas).  Puede sentir dolor plvico debido al aumento de peso y a que las hormonas del embarazo relajan las articulaciones entre los huesos de la pelvis. El dolor de espalda puede ser consecuencia de la sobrecarga de los msculos que soportan la postura.  Tal vez haya cambios en el cabello que pueden incluir su engrosamiento, crecimiento rpido y cambios en la textura. Adems, a algunas mujeres se les cae el cabello durante o despus del embarazo, o tienen el cabello seco o fino. Lo ms probable es que el cabello se le normalice despus del nacimiento del beb.  Las mamas seguirn creciendo y le dolern. A veces, puede haber una secrecin amarilla de las mamas llamada calostro.  El ombligo puede salir hacia afuera.  Puede sentir que le falta el aire debido a que se expande el tero.  Puede notar que el feto "baja" o lo siente ms bajo, en  el abdomen.  Puede tener una prdida de secrecin mucosa con sangre. Esto suele ocurrir en el trmino de unos pocos das a una semana antes de que comience el trabajo de parto.  El cuello del tero se vuelve delgado y blando (se borra) cerca de la fecha de parto. QU DEBE ESPERAR EN LOS EXMENES PRENATALES  Le harn exmenes prenatales cada 2semanas hasta la semana36. A partir de ese momento le harn exmenes semanales. Durante una visita prenatal de rutina:  La pesarn para asegurarse de que usted y el feto estn creciendo normalmente.  Le tomarn la presin arterial.  Le medirn el abdomen para controlar el desarrollo del beb.  Se escucharn los latidos cardacos fetales.  Se evaluarn los resultados de los estudios solicitados en visitas anteriores.  Le revisarn el cuello del tero cuando est prxima la fecha de parto para controlar si este se ha borrado. Alrededor de la semana36, el mdico le revisar el cuello del tero. Al mismo tiempo, realizar un anlisis de las secreciones del tejido vaginal. Este examen es para determinar si hay un tipo de bacteria, estreptococo Grupo B. El mdico le explicar esto con ms detalle. El mdico puede preguntarle lo siguiente:  Cmo le gustara que fuera el parto.  Cmo se siente.  Si siente los movimientos del beb.  Si ha tenido sntomas anormales, como prdida de lquido, sangrado, dolores de cabeza intensos o clicos abdominales.  Si tiene alguna pregunta. Otros exmenes o estudios de deteccin que pueden realizarse   durante el tercer trimestre incluyen lo siguiente:  Anlisis de sangre para controlar las concentraciones de hierro (anemia).  Controles fetales para determinar su salud, nivel de actividad y crecimiento. Si tiene alguna enfermedad o hay problemas durante el embarazo, le harn estudios. FALSO TRABAJO DE PARTO Es posible que sienta contracciones leves e irregulares que finalmente desaparecen. Se llaman contracciones de  Braxton Hicks o falso trabajo de parto. Las contracciones pueden durar horas, das o incluso semanas, antes de que el verdadero trabajo de parto se inicie. Si las contracciones ocurren a intervalos regulares, se intensifican o se hacen dolorosas, lo mejor es que la revise el mdico.  SIGNOS DE TRABAJO DE PARTO   Clicos de tipo menstrual.  Contracciones cada 5minutos o menos.  Contracciones que comienzan en la parte superior del tero y se extienden hacia abajo, a la zona inferior del abdomen y la espalda.  Sensacin de mayor presin en la pelvis o dolor de espalda.  Una secrecin de mucosidad acuosa o con sangre que sale de la vagina. Si tiene alguno de estos signos antes de la semana37 del embarazo, llame a su mdico de inmediato. Debe concurrir al hospital para que la controlen inmediatamente. INSTRUCCIONES PARA EL CUIDADO EN EL HOGAR   Evite fumar, consumir hierbas, beber alcohol y tomar frmacos que no le hayan recetado. Estas sustancias qumicas afectan la formacin y el desarrollo del beb.  Siga las indicaciones del mdico en relacin con el uso de medicamentos. Durante el embarazo, hay medicamentos que son seguros de tomar y otros que no.  Haga actividad fsica solo en la forma indicada por el mdico. Sentir clicos uterinos es un buen signo para detener la actividad fsica.  Contine comiendo alimentos que sanos con regularidad.  Use un sostn que le brinde buen soporte si le duelen las mamas.  No se d baos de inmersin en agua caliente, baos turcos ni saunas.  Colquese el cinturn de seguridad cuando conduzca.  No coma carne cruda ni queso sin cocinar; evite el contacto con las bandejas sanitarias de los gatos y la tierra que estos animales usan. Estos elementos contienen grmenes que pueden causar defectos congnitos en el beb.  Tome las vitaminas prenatales.  Si est estreida, pruebe un laxante suave (si el mdico lo autoriza). Consuma ms alimentos ricos en  fibra, como vegetales y frutas frescos y cereales integrales. Beba gran cantidad de lquido para mantener la orina de tono claro o color amarillo plido.  Dese baos de asiento con agua tibia para aliviar el dolor o las molestias causadas por las hemorroides. Use una crema para las hemorroides si el mdico la autoriza.  Si tiene venas varicosas, use medias de descanso. Eleve los pies durante 15minutos, 3 o 4veces por da. Limite la cantidad de sal en su dieta.  Evite levantar objetos pesados, use zapatos de tacones bajos y mantenga una buena postura.  Descanse con las piernas elevadas si tiene calambres o dolor de cintura.  Visite a su dentista si no lo ha hecho durante el embarazo. Use un cepillo de dientes blando para higienizarse los dientes y psese el hilo dental con suavidad.  Puede seguir manteniendo relaciones sexuales, a menos que el mdico le indique lo contrario.  No haga viajes largos excepto que sea absolutamente necesario y solo con la autorizacin del mdico.  Tome clases prenatales para entender, practicar y hacer preguntas sobre el trabajo de parto y el parto.  Haga un ensayo de la partida al hospital.  Prepare el bolso que   llevar al hospital.  Prepare la habitacin del beb.  Concurra a todas las visitas prenatales segn las indicaciones de su mdico. SOLICITE ATENCIN MDICA SI:  No est segura de que est en trabajo de parto o de que ha roto la bolsa de las aguas.  Tiene mareos.  Siente clicos leves, presin en la pelvis o dolor persistente en el abdomen.  Tiene nuseas, vmitos o diarrea persistentes.  Tiene secrecin vaginal con mal olor.  Siente dolor al orinar. SOLICITE ATENCIN MDICA DE INMEDIATO SI:   Tiene fiebre.  Tiene una prdida de lquido por la vagina.  Tiene sangrado o pequeas prdidas vaginales.  Siente dolor intenso o clicos en el abdomen.  Sube o baja de peso rpidamente.  Tiene dificultad para respirar y siente dolor de  pecho.  Sbitamente se le hinchan mucho el rostro, las manos, los tobillos, los pies o las piernas.  No ha sentido los movimientos del beb durante una hora.  Siente un dolor de cabeza intenso que no se alivia con medicamentos.  Hay cambios en la visin. Document Released: 10/10/2004 Document Revised: 01/05/2013 [employer] Patient Information 2015 [employer]. This information is not intended to replace advice given to you by your health care provider. Make sure you discuss any questions you have with your health care provider.  Lactancia materna (Breastfeeding) Decidir amamantar es una de las mejores elecciones que puede hacer por usted y su beb. El cambio hormonal durante el embarazo produce el desarrollo del tejido mamario y aumenta la cantidad y el tamao de los conductos galactforos. Estas hormonas tambin permiten que las protenas, los azcares y las grasas de la sangre produzcan la leche materna en las glndulas productoras de leche. Las hormonas impiden que la leche materna sea liberada antes del nacimiento del beb, adems de impulsar el flujo de leche luego del nacimiento. Una vez que ha comenzado a amamantar, pensar en el beb, as como la succin o el llanto, pueden estimular la liberacin de leche de las glndulas productoras de leche.  LOS BENEFICIOS DE AMAMANTAR Para el beb  La primera leche (calostro) ayuda a mejorar el funcionamiento del sistema digestivo del beb.  La leche tiene anticuerpos que ayudan a prevenir las infecciones en el beb.  El beb tiene una menor incidencia de asma, alergias y del sndrome de muerte sbita del lactante.  Los nutrientes en la leche materna son mejores para el beb que la leche maternizada y estn preparados exclusivamente para cubrir las necesidades del beb.  La leche materna mejora el desarrollo cerebral del beb.  Es menos probable que el beb desarrolle otras enfermedades, como obesidad infantil, asma o diabetes mellitus de  tipo 2. Para usted   La lactancia materna favorece el desarrollo de un vnculo muy especial entre la madre y el beb.  Es conveniente. La leche materna siempre est disponible a la temperatura correcta y es econmica.  La lactancia materna ayuda a quemar caloras y a perder el peso ganado durante el embarazo.  Favorece la contraccin del tero al tamao que tena antes del embarazo de manera ms rpida y disminuye el sangrado (loquios) despus del parto.  La lactancia materna contribuye a reducir el riesgo de desarrollar diabetes mellitus de tipo 2, osteoporosis o cncer de mama o de ovario en el futuro. SIGNOS DE QUE EL BEB EST HAMBRIENTO Primeros signos de hambre  Aumenta su estado de alerta o actividad.  Se estira.  Mueve la cabeza de un lado a otro.  Mueve la cabeza y   abre la boca cuando se le toca la mejilla o la comisura de la boca (reflejo de bsqueda).  Aumenta las vocalizaciones, tales como sonidos de succin, se relame los labios, emite arrullos, suspiros, o chirridos.  Mueve la mano hacia la boca.  Se chupa con ganas los dedos o las manos. Signos tardos de hambre  Est agitado.  Llora de manera intermitente. Signos de hambre extrema Los signos de hambre extrema requerirn que lo calme y lo consuele antes de que el beb pueda alimentarse adecuadamente. No espere a que se manifiesten los siguientes signos de hambre extrema para comenzar a amamantar:   Agitacin.  Llanto intenso y fuerte.   Gritos. INFORMACIN BSICA SOBRE LA LACTANCIA MATERNA Iniciacin de la lactancia materna  Encuentre un lugar cmodo para sentarse o acostarse, con un buen respaldo para el cuello y la espalda.  Coloque una almohada o una manta enrollada debajo del beb para acomodarlo a la altura de la mama (si est sentada). Las almohadas para amamantar se han diseado especialmente a fin de servir de apoyo para los brazos y el beb mientras amamanta.  Asegrese de que el abdomen del  beb est frente al suyo.  Masajee suavemente la mama. Con las yemas de los dedos, masajee la pared del pecho hacia el pezn en un movimiento circular. Esto estimula el flujo de leche. Es posible que deba continuar este movimiento mientras amamanta si la leche fluye lentamente.  Sostenga la mama con el pulgar por arriba del pezn y los otros 4 dedos por debajo de la mama. Asegrese de que los dedos se encuentren lejos del pezn y de la boca del beb.  Empuje suavemente los labios del beb con el pezn o con el dedo.  Cuando la boca del beb se abra lo suficiente, acrquelo rpidamente a la mama e introduzca todo el pezn y la zona oscura que lo rodea (areola), tanto como sea posible, dentro de la boca del beb.  Debe haber ms areola visible por arriba del labio superior del beb que por debajo del labio inferior.  La lengua del beb debe estar entre la enca inferior y la mama.  Asegrese de que la boca del beb est en la posicin correcta alrededor del pezn (prendida). Los labios del beb deben crear un sello sobre la mama y estar doblados hacia afuera (invertidos).  Es comn que el beb succione durante 2 a 3 minutos para que comience el flujo de leche materna. Cmo debe prenderse Es muy importante que le ensee al beb cmo prenderse adecuadamente a la mama. Si el beb no se prende adecuadamente, puede causarle dolor en el pezn y reducir la produccin de leche materna, y hacer que el beb tenga un escaso aumento de peso. Adems, si el beb no se prende adecuadamente al pezn, puede tragar aire durante la alimentacin. Esto puede causarle molestias al beb. Hacer eructar al beb al cambiar de mama puede ayudarlo a liberar el aire. Sin embargo, ensearle al beb cmo prenderse a la mama adecuadamente es la mejor manera de evitar que se sienta molesto por tragar aire mientras se alimenta. Signos de que el beb se ha prendido adecuadamente al pezn:   Tironea o succiona de modo silencioso,  sin causarle dolor.  Se escucha que traga cada 3 o 4 succiones.   Hay movimientos musculares por arriba y por delante de sus odos al succionar. Signos de que el beb no se ha prendido adecuadamente al pezn:   Hace ruidos de succin o   de chasquido mientras se alimenta.  Siente dolor en el pezn. Si cree que el beb no se prendi correctamente, deslice el dedo en la comisura de la boca y colquelo entre las encas del beb para interrumpir la succin. Intente comenzar a amamantar nuevamente. Signos de lactancia materna exitosa Signos del beb:   Disminuye gradualmente el nmero de succiones o cesa la succin por completo.  Se duerme.  Relaja el cuerpo.  Retiene una pequea cantidad de leche en la boca.  Se desprende solo del pecho. Signos que presenta usted:  Las mamas han aumentado la firmeza, el peso y el tamao 1 a 3 horas despus de amamantar.  Estn ms blandas inmediatamente despus de amamantar.  Un aumento del volumen de leche, y tambin un cambio en su consistencia y color se producen hacia el quinto da de lactancia materna.  Los pezones no duelen, ni estn agrietados ni sangran. Signos de que su beb recibe la cantidad de leche suficiente  Moja al menos 3 paales en 24 horas. La orina debe ser clara y de color amarillo plido a los 5 das de vida.  Defeca al menos 3 veces en 24 horas a los 5 das de vida. La materia fecal debe ser blanda y amarillenta.  Defeca al menos 3 veces en 24 horas a los 7 das de vida. La materia fecal debe ser grumosa y amarillenta.  No registra una prdida de peso mayor del 10% del peso al nacer durante los primeros 3 das de vida.  Aumenta de peso un promedio de 4 a 7onzas (113 a 198g) por semana despus de los 4 das de vida.  Aumenta de peso, diariamente, de manera uniforme a partir de los 5 das de vida, sin registrar prdida de peso despus de las 2semanas de vida. Despus de alimentarse, es posible que el beb regurgite una  pequea cantidad. Esto es frecuente. FRECUENCIA Y DURACIN DE LA LACTANCIA MATERNA El amamantamiento frecuente la ayudar a producir ms leche y a prevenir problemas de dolor en los pezones e hinchazn en las mamas. Alimente al beb cuando muestre signos de hambre o si siente la necesidad de reducir la congestin de las mamas. Esto se denomina "lactancia a demanda". Evite el uso del chupete mientras trabaja para establecer la lactancia (las primeras 4 a 6 semanas despus del nacimiento del beb). Despus de este perodo, podr ofrecerle un chupete. Las investigaciones demostraron que el uso del chupete durante el primer ao de vida del beb disminuye el riesgo de desarrollar el sndrome de muerte sbita del lactante (SMSL). Permita que el nio se alimente en cada mama todo lo que desee. Contine amamantando al beb hasta que haya terminado de alimentarse. Cuando el beb se desprende o se queda dormido mientras se est alimentando de la primera mama, ofrzcale la segunda. Debido a que, con frecuencia, los recin nacidos permanecen somnolientos las primeras semanas de vida, es posible que deba despertar al beb para alimentarlo. Los horarios de lactancia varan de un beb a otro. Sin embargo, las siguientes reglas pueden servir como gua para ayudarla a garantizar que el beb se alimenta adecuadamente:  Se puede amamantar a los recin nacidos (bebs de 4 semanas o menos de vida) cada 1 a 3 horas.  No deben transcurrir ms de 3 horas durante el da o 5 horas durante la noche sin que se amamante a los recin nacidos.  Debe amamantar al beb 8 veces como mnimo en un perodo de 24 horas, hasta que comience a   introducir slidos en su dieta, a los 6 meses de vida aproximadamente. EXTRACCIN DE LECHE MATERNA La extraccin y el almacenamiento de la leche materna le permiten asegurarse de que el beb se alimente exclusivamente de leche materna, aun en momentos en los que no puede amamantar. Esto tiene especial  importancia si debe regresar al trabajo en el perodo en que an est amamantando o si no puede estar presente en los momentos en que el beb debe alimentarse. Su asesor en lactancia puede orientarla sobre cunto tiempo es seguro almacenar leche materna.  El sacaleche es un aparato que le permite extraer leche de la mama a un recipiente estril. Luego, la leche materna extrada puede almacenarse en un refrigerador o congelador. Algunos sacaleches son manuales, mientras que otros son elctricos. Consulte a su asesor en lactancia qu tipo ser ms conveniente para usted. Los sacaleches se pueden comprar; sin embargo, algunos hospitales y grupos de apoyo a la lactancia materna alquilan sacaleches mensualmente. Un asesor en lactancia puede ensearle cmo extraer leche materna manualmente, en caso de que prefiera no usar un sacaleche.  CMO CUIDAR LAS MAMAS DURANTE LA LACTANCIA MATERNA Los pezones se secan, agrietan y duelen durante la lactancia materna. Las siguientes recomendaciones pueden ayudarla a mantener las mamas humectadas y sanas:  Evite usar jabn en los pezones.  Use un sostn de soporte. Aunque no son esenciales, las camisetas sin mangas o los sostenes especiales para amamantar estn diseados para acceder fcilmente a las mamas, para amamantar sin tener que quitarse todo el sostn o la camiseta. Evite usar sostenes con aro o sostenes muy ajustados.  Seque al aire sus pezones durante 3 a 4minutos despus de amamantar al beb.  Utilice solo apsitos de algodn en el sostn para absorber las prdidas de leche. La prdida de un poco de leche materna entre las tomas es normal.  Utilice lanolina sobre los pezones luego de amamantar. La lanolina ayuda a mantener la humedad normal de la piel. Si usa lanolina pura, no tiene que lavarse los pezones antes de volver a alimentar al beb. La lanolina pura no es txica para el beb. Adems, puede extraer manualmente algunas gotas de leche materna y masajear  suavemente esa leche sobre los pezones, para que la leche se seque al aire. Durante las primeras semanas despus de dar a luz, algunas mujeres pueden experimentar hinchazn en las mamas (congestin mamaria). La congestin puede hacer que sienta las mamas pesadas, calientes y sensibles al tacto. El pico de la congestin ocurre dentro de los 3 a 5 das despus del parto. Las siguientes recomendaciones pueden ayudarla a aliviar la congestin:  Vace por completo las mamas al amamantar o extraer leche. Puede aplicar calor hmedo en las mamas (en la ducha o con toallas hmedas para manos) antes de amamantar o extraer leche. Esto aumenta la circulacin y ayuda a que la leche fluya. Si el beb no vaca por completo las mamas cuando lo amamanta, extraiga la leche restante despus de que haya finalizado.  Use un sostn ajustado (para amamantar o comn) o una camiseta sin mangas durante 1 o 2 das para indicar al cuerpo que disminuya ligeramente la produccin de leche.  Aplique compresas de hielo sobre las mamas, a menos que le resulte demasiado incmodo.  Asegrese de que el beb est prendido y se encuentre en la posicin correcta mientras lo alimenta. Si la congestin persiste luego de 48 horas o despus de seguir estas recomendaciones, comunquese con su mdico o un asesor en lactancia. RECOMENDACIONES GENERALES   PARA EL CUIDADO DE LA SALUD DURANTE LA LACTANCIA MATERNA  Consuma alimentos saludables. Alterne comidas y colaciones, y coma 3 de cada una por da. Dado que lo que come afecta la leche materna, es posible que algunas comidas hagan que su beb se vuelva ms irritable de lo habitual. Evite comer este tipo de alimentos si percibe que afectan de manera negativa al beb.  Beba leche, jugos de fruta y agua para satisfacer su sed (aproximadamente 10 vasos al da).  Descanse con frecuencia, reljese y tome sus vitaminas prenatales para evitar la fatiga, el estrs y la anemia.  Contine con los  autocontroles de la mama.  Evite masticar y fumar tabaco.  Evite el consumo de alcohol y drogas. Algunos medicamentos, que pueden ser perjudiciales para el beb, pueden pasar a travs de la leche materna. Es importante que consulte a su mdico antes de tomar cualquier medicamento, incluidos todos los medicamentos recetados y de venta libre, as como los suplementos vitamnicos y herbales. Puede quedar embarazada durante la lactancia. Si desea controlar la natalidad, consulte a su mdico cules son las opciones ms seguras para el beb. SOLICITE ATENCIN MDICA SI:   Usted siente que quiere dejar de amamantar o se siente frustrada con la lactancia.  Siente dolor en las mamas o en los pezones.  Sus pezones estn agrietados o sangran.  Sus pechos estn irritados, sensibles o calientes.  Tiene un rea hinchada en cualquiera de las mamas.  Siente escalofros o fiebre.  Tiene nuseas o vmitos.  Presenta una secrecin de otro lquido distinto de la leche materna de los pezones.  Sus mamas no se llenan antes de amamantar al beb para el quinto da despus del parto.  Se siente triste y deprimida.  El beb est demasiado somnoliento como para comer bien.  El beb tiene problemas para dormir.  Moja menos de 3 paales en 24 horas.  Defeca menos de 3 veces en 24 horas.  La piel del beb o la parte blanca de los ojos se vuelven amarillentas.  El beb no ha aumentado de peso a los 5 das de vida. SOLICITE ATENCIN MDICA DE INMEDIATO SI:   El beb est muy cansado (letargo) y no se quiere despertar para comer.  Le sube la fiebre sin causa. Document Released: 12/31/2004 Document Revised: 01/05/2013 [employer] Patient Information 2015 [employer]. This information is not intended to replace advice given to you by your health care provider. Make sure you discuss any questions you have with your health care provider.  

## 2014-09-05 NOTE — Progress Notes (Signed)
Subjective:  Kayla Scott is a 39 y.o. [id number] at [redacted]w[redacted]d being seen today for ongoing prenatal care.  Patient reports no complaints.  Contractions: Not present.  Vag. Bleeding: None. Movement: (!) Decreased. Denies leaking of fluid.  Spanish interpreter: Marlynn Perking used  The following portions of the patient's history were reviewed and updated as appropriate: allergies, current medications, past family history, past medical history, past social history, past surgical history and problem list.   Objective:   Filed Vitals:   09/05/14 0816  BP: 104/54  Pulse: 102  Weight: 253 lb 11.2 oz (115.078 kg)    Fetal Status:     Movement: (!) Decreased     General:  Alert, oriented and cooperative. Patient is in no acute distress.  Skin: Skin is warm and dry. No rash noted.   Cardiovascular: Normal heart rate noted  Respiratory: Normal respiratory effort, no problems with respiration noted  Abdomen: Soft, gravid, appropriate for gestational age. Pain/Pressure: Present     Pelvic: Vag. Bleeding: None     Cervical exam deferred        Extremities: Normal range of motion.  Edema: None  Mental Status: Normal mood and affect. Normal behavior. Normal judgment and thought content.   Urinalysis:    gluc neg, prot neg NST reviewed and reactive. No book, meter is not working correctly  FBS 93-100 2 hr pp 105-140 Assessment and Plan:  Pregnancy: [id number] at [redacted]w[redacted]d  1. Gestational diabetes mellitus, antepartum Stopped her glucophage due to side effects, begin Glyburide - Fetal nonstress test - Flu Vaccine QUAD 36+ mos IM; Standing - Flu Vaccine QUAD 36+ mos IM - glyBURIDE (DIABETA) 2.5 MG tablet; Take 0.5 tablets (1.25 mg total) by mouth 2 (two) times daily with a meal.  Dispense: 60 tablet; Refill: 3  2. Supervision of high risk pregnancy, antepartum, second trimester Continue routine prenatal care.  3. Previous cesarean section complicating pregnancy, antepartum condition or  complication Scheduled for RCS at 39 wks with BTL.  Term labor symptoms and general obstetric precautions including but not limited to vaginal bleeding, contractions, leaking of fluid and fetal movement were reviewed in detail with the patient. Please refer to After Visit Summary for other counseling recommendations.  Return in 1 week (on 09/12/2014) for NST only in 3 days, OB visit and NST, HRC.   Reva Bores, MD

## 2014-09-07 ENCOUNTER — Encounter (HOSPITAL_COMMUNITY): Payer: Self-pay | Admitting: *Deleted

## 2014-09-08 ENCOUNTER — Telehealth: Payer: Self-pay | Admitting: *Deleted

## 2014-09-08 ENCOUNTER — Ambulatory Visit (HOSPITAL_COMMUNITY)
Admission: RE | Admit: 2014-09-08 | Discharge: 2014-09-08 | Disposition: A | Discharge status: Home / Self Care | Payer: Self-pay | Source: Ambulatory Visit | Attending: Family Medicine | Admitting: Family Medicine

## 2014-09-08 ENCOUNTER — Other Ambulatory Visit: Payer: Self-pay | Admitting: Family Medicine

## 2014-09-08 ENCOUNTER — Ambulatory Visit (INDEPENDENT_AMBULATORY_CARE_PROVIDER_SITE_OTHER): Payer: Self-pay | Admitting: *Deleted

## 2014-09-08 VITALS — BP 107/59 | HR 101

## 2014-09-08 DIAGNOSIS — Z3A33 33 weeks gestation of pregnancy: Secondary | ICD-10-CM

## 2014-09-08 DIAGNOSIS — O359XX0 Maternal care for (suspected) fetal abnormality and damage, unspecified, not applicable or unspecified: Secondary | ICD-10-CM

## 2014-09-08 DIAGNOSIS — O358XX0 Maternal care for other (suspected) fetal abnormality and damage, not applicable or unspecified: Secondary | ICD-10-CM | POA: Insufficient documentation

## 2014-09-08 DIAGNOSIS — O289 Unspecified abnormal findings on antenatal screening of mother: Secondary | ICD-10-CM

## 2014-09-08 DIAGNOSIS — O34219 Maternal care for unspecified type scar from previous cesarean delivery: Secondary | ICD-10-CM

## 2014-09-08 DIAGNOSIS — O283 Abnormal ultrasonic finding on antenatal screening of mother: Secondary | ICD-10-CM | POA: Insufficient documentation

## 2014-09-08 DIAGNOSIS — O09523 Supervision of elderly multigravida, third trimester: Secondary | ICD-10-CM

## 2014-09-08 DIAGNOSIS — O24419 Gestational diabetes mellitus in pregnancy, unspecified control: Secondary | ICD-10-CM

## 2014-09-08 DIAGNOSIS — O3421 Maternal care for scar from previous cesarean delivery: Secondary | ICD-10-CM | POA: Insufficient documentation

## 2014-09-08 DIAGNOSIS — E669 Obesity, unspecified: Secondary | ICD-10-CM | POA: Insufficient documentation

## 2014-09-08 DIAGNOSIS — Z8751 Personal history of pre-term labor: Secondary | ICD-10-CM

## 2014-09-08 DIAGNOSIS — O99213 Obesity complicating pregnancy, third trimester: Secondary | ICD-10-CM | POA: Insufficient documentation

## 2014-09-08 DIAGNOSIS — Z3493 Encounter for supervision of normal pregnancy, unspecified, third trimester: Secondary | ICD-10-CM

## 2014-09-08 MED ORDER — PRENATAL COMPLETE 14-0.4 MG PO TABS: 1.0000 | ORAL_TABLET | Freq: Every day | ORAL | Status: DC

## 2014-09-08 NOTE — Progress Notes (Signed)
NST reactive.

## 2014-09-08 NOTE — Telephone Encounter (Signed)
Pharmacy called stating they do not have prenatal complete vitamins and want to substitute prenatal plus vitamins. Change approved per protocol.

## 2014-09-08 NOTE — Progress Notes (Signed)
Breastfeeding tip of the week reviewed NST with add on AFI in ultrasound Spanish Interpreter [employer]

## 2014-09-12 ENCOUNTER — Ambulatory Visit (INDEPENDENT_AMBULATORY_CARE_PROVIDER_SITE_OTHER): Payer: Self-pay | Admitting: Obstetrics & Gynecology

## 2014-09-12 VITALS — BP 112/69 | HR 97 | Wt 254.5 lb

## 2014-09-12 DIAGNOSIS — O359XX1 Maternal care for (suspected) fetal abnormality and damage, unspecified, fetus 1: Secondary | ICD-10-CM

## 2014-09-12 DIAGNOSIS — O24419 Gestational diabetes mellitus in pregnancy, unspecified control: Secondary | ICD-10-CM

## 2014-09-12 DIAGNOSIS — O0993 Supervision of high risk pregnancy, unspecified, third trimester: Secondary | ICD-10-CM

## 2014-09-12 LAB — POCT URINALYSIS DIP (DEVICE)
Bilirubin Urine: NEGATIVE
Glucose, UA: 100 mg/dL — AB
Ketones, ur: NEGATIVE mg/dL
LEUKOCYTES UA: NEGATIVE
NITRITE: NEGATIVE
PH: 6 (ref 5.0–8.0)
Protein, ur: NEGATIVE mg/dL
Specific Gravity, Urine: 1.015 (ref 1.005–1.030)
Urobilinogen, UA: 0.2 mg/dL (ref 0.0–1.0)

## 2014-09-12 NOTE — Progress Notes (Signed)
Interpreter Carol Hernandez present for encounter.   

## 2014-09-12 NOTE — Patient Instructions (Signed)
Levonorgestrel intrauterine device (IUD) Qu es este medicamento? El LEVONORGESTREL (DIU) es un dispositivo anticonceptivo (control de natalidad). El dispositivo se coloca dentro del tero por un profesional de la salud. Se utiliza para evitar el embarazo y tambin se puede utilizar para tratar el sangrado abundante que ocurre durante su perodo. Dependiendo del dispositivo, se puede utilizar por 3 a 5 aos. Este medicamento puede ser utilizado para otros usos; si tiene alguna pregunta consulte con su proveedor de atencin mdica o con su farmacutico. MARCAS COMERCIALES DISPONIBLES: LILETTA, Mirena, Skyla Qu le debo informar a mi profesional de la salud antes de tomar este medicamento? Necesita saber si usted presenta alguno de los siguientes problemas o situaciones: -exmen de Papanicolaou anormal -cncer de mama, cuello del tero o tero -diabetes -endometritis -si tiene una infeccin plvica o genital actual o en el pasado -tiene ms de una pareja sexual o si su pareja tiene ms de una pareja -enfermedad cardiaca -antecedente de embarazo tubrico o ectpico -problemas del sistema inmunolgico -DIU colocado -enfermedad heptica o tumor del hgado -problemas con la coagulacin o si toma diluyentes sanguneos -usa medicamentos intravenoso -forma inusual del tero -sangrado vaginal que no tiene explicacin -una reaccin alrgica o inusual al levonorgestrel, a otras hormonas, a la silicona o polietilenos, a otros medicamentos, alimentos, colorantes o conservantes -si est embarazada o buscando quedar embarazada -si est amamantando a un beb Cmo debo utilizar este medicamento? Un profesional de la salud coloca este dispositivo en el tero. Hable con su pediatra para informarse acerca del uso de este medicamento en nios. Puede requerir atencin especial. Sobredosis: Pngase en contacto inmediatamente con un centro toxicolgico o una sala de urgencia si usted cree que haya tomado  demasiado medicamento. ATENCIN: Este medicamento es solo para usted. No comparta este medicamento con nadie. Qu sucede si me olvido de una dosis? No se aplica en este caso. Qu puede interactuar con este medicamento? No tome esta medicina con ninguno de los siguientes medicamentos: -amprenavir -bosentano -fosamprenavir Esta medicina tambin puede interactuar con los siguientes medicamentos: -aprepitant -barbitricos para producir el sueo o para el tratamiento de convulsiones -bexaroteno -griseofulvina -medicamentos para tratar los convulsiones, tales como carbamazepina, etotona, felbamato, oxcarbazepina, fenitona, topiramato -modafinilo -pioglitazona -rifabutina -rifampicina -rifapentina -algunos medicamentos para tratar el virus VIH, tales como atazanavir, indinavir, lopinavir, nelfinavir, tipranavir, ritonavir -hierba de [place] -warfarina Puede ser que esta lista no menciona todas las posibles interacciones. Informe a su profesional de la salud de todos los productos a base de hierbas, medicamentos de venta libre o suplementos nutritivos que est tomando. Si usted fuma, consume bebidas alcohlicas o si utiliza drogas ilegales, indqueselo tambin a su profesional de la salud. Algunas sustancias pueden interactuar con su medicamento. A qu debo estar atento al usar este medicamento? Visite a su mdico o a su profesional de la salud para chequear su evolucin peridicamente. Visite a su mdico si usted o su pareja tiene relaciones sexuales con otras personas, se vuelve VIH positivo o contrae una enfermedad de transmisin sexual. Este medicamento no la protege de la infeccin por VIH (SIDA) ni de ninguna otra enfermedad de transmisin sexual. Puede controlar la ubicacin del DIU usted misma palpando con sus dedos limpios los hilos en la parte anterior de la vagina. No tire de los hilos. Es un buen hbito controlar la ubicacin del dispositivo despus de cada perodo menstrual. Si  no slo siente los hilos sino que adems siente otra parte ms del DIU o si no puede sentir los hilos, consulte a su   mdico inmediatamente. El DIU puede salirse por s solo. Puede quedar embarazada si el dispositivo se sale de su lugar. Utilice un mtodo anticonceptivo adicional, como preservativos, y consulte a su proveedor de atencin mdica s observa que el DIU se sali de su lugar. La utilizacin de tampones no cambia la posicin del DIU y no hay inconvenientes en usarlos durante su perodo. Qu efectos secundarios puedo tener al utilizar este medicamento? Efectos secundarios que debe informar a su mdico o a su profesional de la salud tan pronto como sea posible: -reacciones alrgicas como erupcin cutnea, picazn o urticarias, hinchazn de la cara, labios o lengua -fiebre, sntomas gripales -llagas genitales -alta presin sangunea -ausencia de un perodo menstrual durante 6 semanas mientras lo utiliza -dolor, hinchazn o calor en las piernas -dolor o sensibilidad del plvico -dolor de cabeza repentino o severo -signos de embarazo -calambres estomacales -falta de aliento repentina -problemas de coordinacin, del habla, al caminar -sangrado, flujo vaginal inusual -color amarillento de los ojos o la piel Efectos secundarios que, por lo general, no requieren atencin mdica (debe informarlos a su mdico o a su profesional de la salud si persisten o si son molestos): -acn -dolor de pecho -cambios en el deseo sexual o capacidad -cambios de peso -calambres, mareos o sensacin de desmayo mientras se introduce el dispositivo -dolor de cabeza -sangrado menstruales irregulares en los primeros 3 a 6 meses de usar -nuseas Puede ser que esta lista no menciona todos los posibles efectos secundarios. Comunquese a su mdico por asesoramiento mdico sobre los efectos secundarios. Usted puede informar los efectos secundarios a la FDA por telfono al 1-800-FDA-1088. Dnde debo guardar mi  medicina? No se aplica en este caso. ATENCIN: Este folleto es un resumen. Puede ser que no cubra toda la posible informacin. Si usted tiene preguntas acerca de esta medicina, consulte con su mdico, su farmacutico o su profesional de la salud.  2015, Elsevier/[employer] Standard. (2011-02-19 16:57:41) Eleccin del mtodo anticonceptivo (Contraception Choices) La anticoncepcin (control de la natalidad) es el uso de cualquier mtodo o dispositivo para evitar el embarazo. A continuacin se indican algunos de esos mtodos. MTODOS HORMONALES   El Implante contraconceptivo consiste en un tubo plstico delgado que contiene la hormona progesterona. No contiene estrgenos. El mdico inserta el tubo en la parte interna del brazo. El tubo puede permanecer en el lugar durante 3 aos. Despus de los 3 aos debe retirarse. El implante impide que los ovarios liberen vulos (ovulacin), espesa el moco cervical, lo que evita que los espermatozoides ingresen al tero y hace ms delgada la membrana que cubre el interior del tero.  Inyecciones de progesterona sola: las administra el mdico cada 3 meses para evitar el embarazo. La progesterona sinttica impide que los ovarios liberen vulos. Tambin hacen que el moco cervical se espese y modifique el tejido de recubrimiento interno del tero. Esto hace ms difcil que los espermatozoides sobrevivan en el tero.  Las pldoras anticonceptivas contienen estrgenos y progesterona. Su funcin es evitar que los ovarios liberen vulos (ovulacin). Las hormonas de los anticonceptivos orales hacen que el moco cervical se haga ms espeso, lo que evita que el esperma ingrese al tero. Las pldoras anticonceptivas son recetadas por el mdico.Tambin se utilizan para tratar los perodos menstruales abundantes.  Minipldora: este tipo de pldora anticonceptiva contiene slo hormona progesterona. Deben tomarse todos los das del mes y debe recetarlas el mdico.  El parche de control  de natalidad: contiene hormonas similares a las que contienen las pldoras anticonceptivas. Deben cambiarse   una vez por semana y se utilizan bajo prescripcin mdica.  Anillo vaginal: contiene hormonas similares a las que contienen las pldoras anticonceptivas. Se deja colocado durante tres semanas, se lo retira durante 1 semana y luego se coloca uno nuevo. La paciente debe sentirse cmoda al insertar y retirar el anillo de la vagina.Es necesaria la prescripcin mdica.  Anticonceptivos de emergencia: son mtodos para evitar un embarazo despus de una relacin sexual sin proteccin. Esta pldora puede tomarse inmediatamente despus de tener relaciones sexuales o hasta 5 das de haber tenido sexo sin proteccin. Es ms efectiva si se toma poco tiempo despus de la relacin sexual. Los anticonceptivos de emergencia estn disponibles sin prescripcin mdica. Consltelo con su farmacutico. No use los anticonceptivos de emergencia como nico mtodo anticonceptivo. MTODOS DE BARRERA   Condn masculino: es una vaina delgada (ltex o goma) que se coloca cubriendo al pene durante el acto sexual. Puede usarse con espermicida para aumentar la efectividad.  Condn femenino. Es una funda delicada y blanda que se adapta holgadamente a la vagina antes de las relaciones sexuales.  Diafragma: es una barrera de ltex redonda y suave que debe ser recomendado por un profesional. Se inserta en la vagina, junto con un gel espermicida. Debe insertarse antes de tener relaciones sexuales. Debe dejar el diafragma colocado en la vagina durante 6 a 8 horas despus de la relacin sexual.  Capuchn cervical: es una barrera de ltex o taza plstica redonda y suave que cubre el cuello del tero y debe ser colocada por un mdico. Puede dejarlo colocado en la vagina hasta 48 horas despus de las relaciones sexuales.  Esponja: es una pieza blanda y circular de espuma de poliuretano. Contiene un espermicida. Se inserta en la vagina  despus de mojarla y antes de las relaciones sexuales.  Espermicidas: son sustancias qumicas que matan o bloquean al esperma y no lo dejan ingresar al cuello del tero y al tero. Vienen en forma de cremas, geles, supositorios, espuma o comprimidos. No es necesario tener receta mdica. Se insertan en la vagina con un aplicador antes de tener relaciones sexuales. El proceso debe repetirse cada vez que tiene relaciones sexuales. ANTICONCEPTIVOS INTRAUTERINOS  Dispositivo intrauterino (DIU) es un dispositivo en forma de T que se coloca en el tero durante el perodo menstrual, para evitar el embarazo. Hay dos tipos:  DIU de cobre: este tipo de DIU est recubierto con un alambre de cobre y se inserta dentro del tero. El cobre hace que el tero y las trompas de Falopio produzcan un liquido que destruye los espermatozoides. Puede permanecer colocado durante 10 aos.  DIU con hormona: este tipo de DIU contiene la hormona progestina (progesterona sinttica). La hormona espesa el moco cervical y evita que los espermatozoides ingresen al tero y tambin afina la membrana que cubre el tero para evitar la implantacin del vulo fertilizado. La hormona debilita o destruye los espermatozoides que ingresan al tero. Puede permanecer en el lugar durante 3-5 aos, segn el tipo de DIU que se utilice. MTODOS ANTICONCEPTIVOS PERMANENTES  Ligadura de trompas en la mujer: se realiza sellando, atando u obstruyendo quirrgicamente las trompas de Falopio lo que impide que el vulo descienda hacia el tero.  Esterilizacin histeroscpica: Implica la colocacin de un pequeo espiral o la insercin en cada trompa de Falopio. El mdico utiliza una tcnica llamada histeroscopa para realizar este procedimiento. El dispositivo produce la formacin de tejido cicatrizal. Esto da como resultado una obstruccin permanente de las trompas de Falopio, de modo que la   esperma no pueda fertilizar el vulo. Demora alrededor de 3 meses  despus del procedimiento hasta que el conducto se obstruye. Tendr que usar otro mtodo anticonceptivo durante al menos 3 meses.  Esterilizacin masculina: se realiza ligando los conductos por los que pasan los espermatozoides (vasectoma).Esto impide que el esperma ingrese a la vagina durante el acto sexual. Luego del procedimiento, el hombre puede eyacular lquido (semen). MTODOS DE PLANIFICACIN NATURAL  Planificacin familiar natural: consiste en no Management consultant o usar un mtodo de barrera (condn, [place], capuchn cervical) en los [employer] la mujer podra quedar [place].  Mtodo de calendario: consiste en el seguimiento de la duracin de cada ciclo menstrual y la identificacin de los perodos frtiles.  Mtodo de ovulacin: Paramedic las relaciones sexuales durante la ovulacin.  Mtodo sintotrmico: Advertising copywriter sexuales en la poca en la que se est ovulando, utilizando un termmetro y tendiendo en cuenta los sntomas de la ovulacin.  Mtodo postovulacin: Youth worker las relaciones sexuales para despus de haber ovulado. Independientemente del tipo o mtodo anticonceptivo que usted elija, es importante que use condones para protegerse contra las infecciones de transmisin sexual (ETS). Hable con su mdico con respecto a qu mtodo anticonceptivo es el ms apropiado para usted. Document Released: 12/31/2004 Document Revised: 09/02/2012 [facility] Patient Information 2015 [place], [place]. This information is not intended to replace advice given to you by your health care provider. Make sure you discuss any questions you have with your health care provider.

## 2014-09-12 NOTE — Progress Notes (Signed)
Subjective:  Kayla Scott is a 39 y.o. [id number] at [redacted]w[redacted]d being seen today for ongoing prenatal care.  Patient reports no complaints.  Contractions: Not present.  Vag. Bleeding: None. Movement: Present. Denies leaking of fluid.   The following portions of the patient's history were reviewed and updated as appropriate: allergies, current medications, past family history, past medical history, past social history, past surgical history and problem list.   Objective:   Filed Vitals:   09/12/14 1025  BP: 112/69  Pulse: 97  Weight: 254 lb 8 oz (115.44 kg)    Fetal Status: Fetal Heart Rate (bpm): NST Fundal Height: 34 cm Movement: Present     General:  Alert, oriented and cooperative. Patient is in no acute distress.  Skin: Skin is warm and dry. No rash noted.   Cardiovascular: Normal heart rate noted  Respiratory: Normal respiratory effort, no problems with respiration noted  Abdomen: Soft, gravid, appropriate for gestational age. Pain/Pressure: Present     Pelvic: Vag. Bleeding: None     Cervical exam deferred        Extremities: Normal range of motion.  Edema: None  Mental Status: Normal mood and affect. Normal behavior. Normal judgment and thought content.   Urinalysis: Urine Protein: Negative Urine Glucose: 1+  Assessment and Plan:  Pregnancy: [id number] at [redacted]w[redacted]d  1. Gestational diabetes mellitus, antepartum CBGs under good control with glyburide - Fetal nonstress test -Cont 2x week testing and growth C/S Oct 4th (rpt)  2. Supervision of high risk pregnancy, antepartum, third trimester As above Needs to decide on birth control.  3. Fetal abnormality affecting management of mother, antepartum condition or complication, fetus 1 Followed by MFM Needs Peds f/u as outpatient (refused consult prior)  Preterm labor symptoms and general obstetric precautions including but not limited to vaginal bleeding, contractions, leaking of fluid and fetal movement were reviewed in  detail with the patient. Please refer to After Visit Summary for other counseling recommendations.  Return in about 2 weeks (around 09/26/2014) for 2x/wk as scheduled.   Lesly Dukes, MD

## 2014-09-15 ENCOUNTER — Ambulatory Visit (HOSPITAL_COMMUNITY)
Admission: RE | Admit: 2014-09-15 | Discharge: 2014-09-15 | Disposition: A | Discharge status: Home / Self Care | Payer: Self-pay | Source: Ambulatory Visit | Attending: Family | Admitting: Family

## 2014-09-15 ENCOUNTER — Other Ambulatory Visit: Payer: Self-pay | Admitting: Family Medicine

## 2014-09-15 ENCOUNTER — Ambulatory Visit (INDEPENDENT_AMBULATORY_CARE_PROVIDER_SITE_OTHER): Payer: Self-pay | Admitting: *Deleted

## 2014-09-15 ENCOUNTER — Encounter (HOSPITAL_COMMUNITY): Payer: Self-pay

## 2014-09-15 VITALS — BP 113/64 | HR 96

## 2014-09-15 DIAGNOSIS — Z3A34 34 weeks gestation of pregnancy: Secondary | ICD-10-CM

## 2014-09-15 DIAGNOSIS — E669 Obesity, unspecified: Secondary | ICD-10-CM | POA: Insufficient documentation

## 2014-09-15 DIAGNOSIS — O99213 Obesity complicating pregnancy, third trimester: Secondary | ICD-10-CM

## 2014-09-15 DIAGNOSIS — O283 Abnormal ultrasonic finding on antenatal screening of mother: Secondary | ICD-10-CM | POA: Insufficient documentation

## 2014-09-15 DIAGNOSIS — O24419 Gestational diabetes mellitus in pregnancy, unspecified control: Secondary | ICD-10-CM

## 2014-09-15 DIAGNOSIS — O34219 Maternal care for unspecified type scar from previous cesarean delivery: Secondary | ICD-10-CM

## 2014-09-15 DIAGNOSIS — O289 Unspecified abnormal findings on antenatal screening of mother: Secondary | ICD-10-CM

## 2014-09-15 DIAGNOSIS — Z8751 Personal history of pre-term labor: Secondary | ICD-10-CM

## 2014-09-15 DIAGNOSIS — O359XX0 Maternal care for (suspected) fetal abnormality and damage, unspecified, not applicable or unspecified: Secondary | ICD-10-CM

## 2014-09-15 DIAGNOSIS — Q330 Congenital cystic lung: Secondary | ICD-10-CM

## 2014-09-15 DIAGNOSIS — O09523 Supervision of elderly multigravida, third trimester: Secondary | ICD-10-CM

## 2014-09-15 DIAGNOSIS — O3421 Maternal care for scar from previous cesarean delivery: Secondary | ICD-10-CM | POA: Insufficient documentation

## 2014-09-15 NOTE — ED Notes (Signed)
Interpreter present for today's appointment provided by language resources.

## 2014-09-15 NOTE — Progress Notes (Signed)
NST reviewed and reactive.  Quintez Maselli L. Harraway-Smith, M.D., FACOG    

## 2014-09-16 NOTE — Progress Notes (Signed)
NST reviewed and reactive.  Lexxus Underhill L. Harraway-Smith, M.D., FACOG    

## 2014-09-18 ENCOUNTER — Other Ambulatory Visit (HOSPITAL_COMMUNITY): Payer: Self-pay | Admitting: Maternal and Fetal Medicine

## 2014-09-18 ENCOUNTER — Ambulatory Visit (HOSPITAL_COMMUNITY): Admission: RE | Admit: 2014-09-18 | Payer: Self-pay | Source: Ambulatory Visit

## 2014-09-18 DIAGNOSIS — O24419 Gestational diabetes mellitus in pregnancy, unspecified control: Secondary | ICD-10-CM

## 2014-09-18 DIAGNOSIS — O09523 Supervision of elderly multigravida, third trimester: Secondary | ICD-10-CM

## 2014-09-18 DIAGNOSIS — O99213 Obesity complicating pregnancy, third trimester: Secondary | ICD-10-CM

## 2014-09-18 DIAGNOSIS — Z3A34 34 weeks gestation of pregnancy: Secondary | ICD-10-CM

## 2014-09-20 ENCOUNTER — Ambulatory Visit (INDEPENDENT_AMBULATORY_CARE_PROVIDER_SITE_OTHER): Payer: Self-pay | Admitting: *Deleted

## 2014-09-20 VITALS — BP 117/65 | HR 92

## 2014-09-20 DIAGNOSIS — O24419 Gestational diabetes mellitus in pregnancy, unspecified control: Secondary | ICD-10-CM

## 2014-09-20 NOTE — Progress Notes (Signed)
Interpreter Maria Elena Jimenez present for encounter.  

## 2014-09-21 NOTE — Progress Notes (Signed)
NST performed today was reviewed and was found to be reactive.  Continue recommended antenatal testing and prenatal care.  

## 2014-09-23 ENCOUNTER — Ambulatory Visit (INDEPENDENT_AMBULATORY_CARE_PROVIDER_SITE_OTHER): Payer: Self-pay | Admitting: *Deleted

## 2014-09-23 VITALS — BP 112/68 | HR 107

## 2014-09-23 DIAGNOSIS — O24419 Gestational diabetes mellitus in pregnancy, unspecified control: Secondary | ICD-10-CM

## 2014-09-23 NOTE — Progress Notes (Signed)
Interpreter Raquel Mora present for encounter.  

## 2014-09-23 NOTE — Progress Notes (Signed)
NST performed today was reviewed and was found to be reactive.  AFI normal at 12.7 cm.  Continue recommended antenatal testing and prenatal care. 

## 2014-09-26 ENCOUNTER — Ambulatory Visit (INDEPENDENT_AMBULATORY_CARE_PROVIDER_SITE_OTHER): Payer: Self-pay | Admitting: Obstetrics & Gynecology

## 2014-09-26 VITALS — BP 117/65 | HR 112 | Wt 258.3 lb

## 2014-09-26 DIAGNOSIS — O0993 Supervision of high risk pregnancy, unspecified, third trimester: Secondary | ICD-10-CM

## 2014-09-26 DIAGNOSIS — O34219 Maternal care for unspecified type scar from previous cesarean delivery: Secondary | ICD-10-CM

## 2014-09-26 DIAGNOSIS — O3421 Maternal care for scar from previous cesarean delivery: Secondary | ICD-10-CM

## 2014-09-26 DIAGNOSIS — Z113 Encounter for screening for infections with a predominantly sexual mode of transmission: Secondary | ICD-10-CM

## 2014-09-26 DIAGNOSIS — O24419 Gestational diabetes mellitus in pregnancy, unspecified control: Secondary | ICD-10-CM

## 2014-09-26 LAB — POCT URINALYSIS DIP (DEVICE)
Bilirubin Urine: NEGATIVE
Glucose, UA: NEGATIVE mg/dL
KETONES UR: NEGATIVE mg/dL
Nitrite: NEGATIVE
PH: 6 (ref 5.0–8.0)
PROTEIN: NEGATIVE mg/dL
SPECIFIC GRAVITY, URINE: 1.01 (ref 1.005–1.030)
Urobilinogen, UA: 0.2 mg/dL (ref 0.0–1.0)

## 2014-09-26 NOTE — Patient Instructions (Signed)
Parto por cesárea - Cuidados posteriores  °(Cesarean Delivery, Care After) °Siga estas instrucciones durante las próximas semanas. Estas indicaciones le proporcionan información general acerca de cómo deberá cuidarse después del procedimiento. El médico también podrá darle instrucciones más específicas. El tratamiento se ha planificado de acuerdo a las prácticas médicas actuales, pero a veces se producen problemas. Comuníquese con el médico si tiene algún problema o tiene dudas cuando vuelva a su casa.  °INSTRUCCIONES PARA EL CUIDADO EN EL HOGAR  °· Tome sólo medicamentos de venta libre o recetados, según las indicaciones del médico. °· No beba alcohol, especialmente si está amamantando o toma analgésicos. °· Nomastique tabaco ni fume. °· Continúe con un adecuado cuidado perineal. El buen cuidado perineal incluye: °¨ Higienizarse de adelante hacia atrás. °¨ Mantener la zona perineal limpia. °· Controlar diariamente el corte (incisión) y observar si aumenta el enrojecimiento, si supura, se hincha o se separa la piel. °· Limpie la incisión suavemente con jabón y agua todos los días, y luego séquela dando golpecitos. Si el médico la autoriza, deje la incisión al descubierto. Use un apósito (vendaje) si drena líquido o la incisión parece irritada. Si las pequeñas tiras adhesivas que cruzan la incisión no se caen dentro de los 7 días, retírelas suavemente. °· Abrace una almohada al toser o estornudar hasta que la incisión se cure. Esto ayuda a aliviar el dolor. °· No conduzca vehículos ni opere maquinarias hasta que el médico la autorice. °· Dúchese, lávese el cabello y tome baños de inmersión según las indicaciones de su médico. °· Utilice un sostén que le ajuste bien y que brinde buen soporte a sus mamas. °· Limite el uso de bombachas de sostén o medias panty. °· Beba suficiente líquido para mantener la orina clara o de color amarillo pálido. °· Consuma todos los días alimentos ricos en fibra como cereales y panes  integrales, arroz, frijoles y frutas frescas y verduras. Estos alimentos pueden ayudarla a prevenir o aliviar el estreñimiento. °· Reanude las actividades como subir escaleras, conducir automóviles, levantar objetos pesados, hacer ejercicios o viajar cuando le indique su médico. °· Hable con su médico acerca de reanudar la actividad sexual. Volver a la actividad sexual depende del riesgo de infección, la velocidad de la curación y la comodidad y su deseo de reanudarla. °· Trate de que alguien la ayude con las actividades del hogar y con el recién nacido al menos durante algunos días después de salir del hospital. °· Descanse todo lo que pueda. Trate de descansar o tomar una siesta mientras el bebé está durmiendo. °· Aumente sus actividades gradualmente. °· Cumpla con todos los controles programados para después del parto. Es muy importante asistir a todas las visitas de control programadas. En estas visitas, su médico va a controlarla para asegurarse de que esté sanando física y emocionalmente. °SOLICITE ATENCIÓN MÉDICA SI:  °· Elimina coágulos grandes por la vagina. Guarde algunos coágulos para mostrarle al médico. °· Tiene una secreción con feo olor que proviene de la vagina. °· Tiene dificultad para orinar. °· Orina con frecuencia. °· Siente dolor al orinar. °· Nota un cambio en sus movimientos intestinales. °· Aumenta el enrojecimiento, el dolor o la hinchazón en la zona de la incisión. °· Observa que supura pus en la incisión. °· La incisión se abre. °· Sus mamas le duelen, están duras o enrojecidas. °· Sufre un dolor intenso de cabeza. °· Tiene visión borrosa o ve manchas. °· Se siente triste o deprimida. °· Tiene pensamientos acerca de lastimarse o dañar al   recién nacido. °· Tiene preguntas acerca de su cuidado, la atención del recién nacido o acerca de los medicamentos. °· Se siente mareada o sufre un desmayo. °· Tiene una erupción. °· Siente dolor u observa enrojecimiento o hinchazón en el sitio en que  estaba la vía intravenosa (IV). °· Tiene náuseas o vómitos. °· Usted dejó de amamantar al bebé y no ha tenido su período menstrual dentro de las 12 semanas siguientes. °· No amamanta al bebé y no tuvo su período menstrual en las últimas 12 semanas. °· Tiene fiebre. °SOLICITE ATENCIÓN MÉDICA DE INMEDIATO SI:  °· Siente dolor persistente. °· Siente dolor en el pecho. °· Le falta el aire. °· Se desmaya. °· Siente dolor en la pierna. °· Siente dolor en el estómago. °· El sangrado vaginal satura dos o más apósitos en 1 hora. °ASEGÚRESE DE QUE:  °· Comprende estas instrucciones. °· Controlará su enfermedad. °· Recibirá ayuda de inmediato si no mejora o si empeora. °Document Released: 12/31/2004 Document Revised: 05/17/2013 °Exit[employer]® Patient Information ©2015 [employer]. This information is not intended to replace advice given to you by your health care provider. Make sure you discuss any questions you have with your health care provider. ° °

## 2014-09-26 NOTE — Progress Notes (Signed)
NST reactive today  Subjective:  Kayla Scott is a 39 y.o. [id number] at [redacted]w[redacted]d being seen today for ongoing prenatal care.  Patient reports no complaints.  Contractions: Not present.  Vag. Bleeding: None. Movement: Present. Denies leaking of fluid.   The following portions of the patient's history were reviewed and updated as appropriate: allergies, current medications, past family history, past medical history, past social history, past surgical history and problem list.   Objective:   Filed Vitals:   09/26/14 0748  BP: 117/65  Pulse: 112  Weight: 258 lb 4.8 oz (117.164 kg)    Fetal Status: Fetal Heart Rate (bpm): NST Fundal Height: 37 cm Movement: Present     General:  Alert, oriented and cooperative. Patient is in no acute distress.  Skin: Skin is warm and dry. No rash noted.   Cardiovascular: Normal heart rate noted  Respiratory: Normal respiratory effort, no problems with respiration noted  Abdomen: Soft, gravid, appropriate for gestational age. Pain/Pressure: Present     Pelvic: Vag. Bleeding: None     Cervical exam deferred        Extremities: Normal range of motion.  Edema: None  Mental Status: Normal mood and affect. Normal behavior. Normal judgment and thought content.  STD testing and GBS Urinalysis: Urine Protein: Negative Urine Glucose: Negative  Assessment and Plan:  Pregnancy: [id number] at [redacted]w[redacted]d  1. Gestational diabetes mellitus, antepartum BG reviewed and in range  2. Previous cesarean section complicating pregnancy, antepartum condition or complication 10/18/14 repeat, wants BTL  3. Gestational diabetes mellitus, antepartum A2/B Good control  Preterm labor symptoms and general obstetric precautions including but not limited to vaginal bleeding, contractions, leaking of fluid and fetal movement were reviewed in detail with the patient. Please refer to After Visit Summary for other counseling recommendations.  Return in about 4 days (around 09/30/2014)  for 2x/wk as scheduled NST.   Adam Phenix, MD

## 2014-09-26 NOTE — Progress Notes (Signed)
Interpreter Carol Hernandez present for encounter.   

## 2014-09-27 LAB — GC/CHLAMYDIA PROBE AMP (~~LOC~~) NOT AT ARMC
CHLAMYDIA, DNA PROBE: NEGATIVE
NEISSERIA GONORRHEA: NEGATIVE

## 2014-09-28 LAB — CULTURE, BETA STREP (GROUP B ONLY)

## 2014-09-30 ENCOUNTER — Ambulatory Visit (INDEPENDENT_AMBULATORY_CARE_PROVIDER_SITE_OTHER): Payer: Self-pay | Admitting: *Deleted

## 2014-09-30 VITALS — BP 115/60 | HR 110

## 2014-09-30 DIAGNOSIS — O24419 Gestational diabetes mellitus in pregnancy, unspecified control: Secondary | ICD-10-CM

## 2014-09-30 NOTE — Progress Notes (Signed)
[place] for growth on 9/19.

## 2014-10-03 ENCOUNTER — Ambulatory Visit (HOSPITAL_COMMUNITY)
Admission: RE | Admit: 2014-10-03 | Discharge: 2014-10-03 | Disposition: A | Discharge status: Home / Self Care | Payer: Self-pay | Source: Ambulatory Visit | Attending: Obstetrics & Gynecology | Admitting: Obstetrics & Gynecology

## 2014-10-03 ENCOUNTER — Encounter: Payer: Self-pay | Admitting: Obstetrics and Gynecology

## 2014-10-03 ENCOUNTER — Ambulatory Visit (INDEPENDENT_AMBULATORY_CARE_PROVIDER_SITE_OTHER): Payer: Self-pay | Admitting: Obstetrics and Gynecology

## 2014-10-03 ENCOUNTER — Other Ambulatory Visit: Payer: Self-pay | Admitting: Obstetrics & Gynecology

## 2014-10-03 ENCOUNTER — Encounter (HOSPITAL_COMMUNITY): Payer: Self-pay

## 2014-10-03 VITALS — BP 107/70 | HR 102 | Wt 260.1 lb

## 2014-10-03 DIAGNOSIS — Z8751 Personal history of pre-term labor: Secondary | ICD-10-CM

## 2014-10-03 DIAGNOSIS — O359XX0 Maternal care for (suspected) fetal abnormality and damage, unspecified, not applicable or unspecified: Secondary | ICD-10-CM

## 2014-10-03 DIAGNOSIS — O3421 Maternal care for scar from previous cesarean delivery: Secondary | ICD-10-CM | POA: Insufficient documentation

## 2014-10-03 DIAGNOSIS — O34219 Maternal care for unspecified type scar from previous cesarean delivery: Secondary | ICD-10-CM

## 2014-10-03 DIAGNOSIS — O283 Abnormal ultrasonic finding on antenatal screening of mother: Secondary | ICD-10-CM | POA: Insufficient documentation

## 2014-10-03 DIAGNOSIS — O09523 Supervision of elderly multigravida, third trimester: Secondary | ICD-10-CM

## 2014-10-03 DIAGNOSIS — O24414 Gestational diabetes mellitus in pregnancy, insulin controlled: Secondary | ICD-10-CM | POA: Insufficient documentation

## 2014-10-03 DIAGNOSIS — O99213 Obesity complicating pregnancy, third trimester: Secondary | ICD-10-CM | POA: Insufficient documentation

## 2014-10-03 DIAGNOSIS — O359XX1 Maternal care for (suspected) fetal abnormality and damage, unspecified, fetus 1: Secondary | ICD-10-CM

## 2014-10-03 DIAGNOSIS — Z3A36 36 weeks gestation of pregnancy: Secondary | ICD-10-CM

## 2014-10-03 DIAGNOSIS — O24419 Gestational diabetes mellitus in pregnancy, unspecified control: Secondary | ICD-10-CM

## 2014-10-03 DIAGNOSIS — O289 Unspecified abnormal findings on antenatal screening of mother: Secondary | ICD-10-CM

## 2014-10-03 DIAGNOSIS — E669 Obesity, unspecified: Secondary | ICD-10-CM | POA: Insufficient documentation

## 2014-10-03 DIAGNOSIS — O0993 Supervision of high risk pregnancy, unspecified, third trimester: Secondary | ICD-10-CM

## 2014-10-03 DIAGNOSIS — O358XX0 Maternal care for other (suspected) fetal abnormality and damage, not applicable or unspecified: Secondary | ICD-10-CM | POA: Insufficient documentation

## 2014-10-03 LAB — POCT URINALYSIS DIP (DEVICE)
BILIRUBIN URINE: NEGATIVE
Glucose, UA: NEGATIVE mg/dL
KETONES UR: NEGATIVE mg/dL
Nitrite: NEGATIVE
PH: 7 (ref 5.0–8.0)
Protein, ur: NEGATIVE mg/dL
SPECIFIC GRAVITY, URINE: 1.015 (ref 1.005–1.030)
Urobilinogen, UA: 0.2 mg/dL (ref 0.0–1.0)

## 2014-10-03 NOTE — Progress Notes (Signed)
09/30/2014- NST reviewed and reactive

## 2014-10-03 NOTE — Progress Notes (Signed)
Subjective:  Kayla Scott is a 39 y.o. [id number] at [redacted]w[redacted]d being seen today for ongoing prenatal care.  Patient reports no complaints.  Contractions: Irregular.  Vag. Bleeding: None. Movement: Present. Denies leaking of fluid.   The following portions of the patient's history were reviewed and updated as appropriate: allergies, current medications, past family history, past medical history, past social history, past surgical history and problem list.   Objective:   Filed Vitals:   10/03/14 0820  BP: 107/70  Pulse: 102  Weight: 260 lb 1.6 oz (117.981 kg)    Fetal Status: Fetal Heart Rate (bpm): NST   Movement: Present     General:  Alert, oriented and cooperative. Patient is in no acute distress.  Skin: Skin is warm and dry. No rash noted.   Cardiovascular: Normal heart rate noted  Respiratory: Normal respiratory effort, no problems with respiration noted  Abdomen: Soft, gravid, appropriate for gestational age. Pain/Pressure: Present     Pelvic: Vag. Bleeding: None     Cervical exam deferred        Extremities: Normal range of motion.  Edema: None  Mental Status: Normal mood and affect. Normal behavior. Normal judgment and thought content.   Urinalysis: Urine Protein: Negative Urine Glucose: Negative  Assessment and Plan:  Pregnancy: [id number] at [redacted]w[redacted]d  1. Gestational diabetes mellitus, antepartum Patient did not bring log book but reports highest fasting of 78, p breaskfast 93, p lunch 113 and p dinner 120 - Fetal nonstress test- reviewed and reactive - Follow up growth ultrasound today  2. Supervision of high risk pregnancy, antepartum, third trimester - Continue ASA  3. AMA (advanced maternal age) multigravida 35+, third trimester   4. Previous cesarean section complicating pregnancy, antepartum condition or complication Scheduled for repeat on 10/18/2014  5. History of pre-term labor   6. Fetal abnormality affecting management of mother, antepartum condition  or complication, fetus 1   Term labor symptoms and general obstetric precautions including but not limited to vaginal bleeding, contractions, leaking of fluid and fetal movement were reviewed in detail with the patient. Please refer to After Visit Summary for other counseling recommendations.  Return in about 4 days (around 10/07/2014) for 2x/wk as scheduled.   Catalina Antigua, MD

## 2014-10-03 NOTE — Progress Notes (Signed)
Interpreter Elna Breslow present for encounter.  Large leuks on udip.  [place] for growth today

## 2014-10-03 NOTE — ED Notes (Signed)
Pt reports pain and pulling at C/S site.

## 2014-10-07 ENCOUNTER — Ambulatory Visit (INDEPENDENT_AMBULATORY_CARE_PROVIDER_SITE_OTHER): Payer: Self-pay | Admitting: *Deleted

## 2014-10-07 VITALS — BP 109/56 | HR 106

## 2014-10-07 DIAGNOSIS — O24419 Gestational diabetes mellitus in pregnancy, unspecified control: Secondary | ICD-10-CM

## 2014-10-07 NOTE — Progress Notes (Signed)
NST reactive.

## 2014-10-10 ENCOUNTER — Encounter: Payer: Self-pay | Admitting: Family Medicine

## 2014-10-10 ENCOUNTER — Ambulatory Visit (INDEPENDENT_AMBULATORY_CARE_PROVIDER_SITE_OTHER): Payer: Self-pay | Admitting: Family Medicine

## 2014-10-10 VITALS — BP 113/73 | HR 105 | Wt 262.2 lb

## 2014-10-10 DIAGNOSIS — O3421 Maternal care for scar from previous cesarean delivery: Secondary | ICD-10-CM

## 2014-10-10 DIAGNOSIS — O24419 Gestational diabetes mellitus in pregnancy, unspecified control: Secondary | ICD-10-CM

## 2014-10-10 DIAGNOSIS — R319 Hematuria, unspecified: Secondary | ICD-10-CM

## 2014-10-10 DIAGNOSIS — O34219 Maternal care for unspecified type scar from previous cesarean delivery: Secondary | ICD-10-CM

## 2014-10-10 DIAGNOSIS — O0993 Supervision of high risk pregnancy, unspecified, third trimester: Secondary | ICD-10-CM

## 2014-10-10 LAB — POCT URINALYSIS DIP (DEVICE)
Bilirubin Urine: NEGATIVE
Glucose, UA: NEGATIVE mg/dL
Ketones, ur: NEGATIVE mg/dL
NITRITE: NEGATIVE
PH: 7 (ref 5.0–8.0)
PROTEIN: NEGATIVE mg/dL
Specific Gravity, Urine: 1.015 (ref 1.005–1.030)
UROBILINOGEN UA: 0.2 mg/dL (ref 0.0–1.0)

## 2014-10-10 NOTE — Patient Instructions (Signed)
Tercer trimestre de embarazo (Third Trimester of Pregnancy) El tercer trimestre va desde la semana29 hasta la 42, desde el sptimo hasta el noveno mes, y es la poca en la que el feto crece ms rpidamente. Hacia el final del noveno mes, el feto mide alrededor de 20pulgadas (45cm) de largo y pesa entre 6 y 10 libras (2,700 y 4,500kg).  CAMBIOS EN EL ORGANISMO Su organismo atraviesa por muchos cambios durante el embarazo, y estos varan de una mujer a otra.   Seguir aumentando de peso. Es de esperar que aumente entre 25 y 35libras (11 y 16kg) hacia el final del embarazo.  Podrn aparecer las primeras estras en las caderas, el abdomen y las mamas.  Puede tener necesidad de orinar con ms frecuencia porque el feto baja hacia la pelvis y ejerce presin sobre la vejiga.  Debido al embarazo podr sentir acidez estomacal con frecuencia.  Puede estar estreida, ya que ciertas hormonas enlentecen los movimientos de los msculos que empujan los desechos a travs de los intestinos.  Pueden aparecer hemorroides o abultarse e hincharse las venas (venas varicosas).  Puede sentir dolor plvico debido al aumento de peso y a que las hormonas del embarazo relajan las articulaciones entre los huesos de la pelvis. El dolor de espalda puede ser consecuencia de la sobrecarga de los msculos que soportan la postura.  Tal vez haya cambios en el cabello que pueden incluir su engrosamiento, crecimiento rpido y cambios en la textura. Adems, a algunas mujeres se les cae el cabello durante o despus del embarazo, o tienen el cabello seco o fino. Lo ms probable es que el cabello se le normalice despus del nacimiento del beb.  Las mamas seguirn creciendo y le dolern. A veces, puede haber una secrecin amarilla de las mamas llamada calostro.  El ombligo puede salir hacia afuera.  Puede sentir que le falta el aire debido a que se expande el tero.  Puede notar que el feto "baja" o lo siente ms bajo, en  el abdomen.  Puede tener una prdida de secrecin mucosa con sangre. Esto suele ocurrir en el trmino de unos pocos das a una semana antes de que comience el trabajo de parto.  El cuello del tero se vuelve delgado y blando (se borra) cerca de la fecha de parto. QU DEBE ESPERAR EN LOS EXMENES PRENATALES  Le harn exmenes prenatales cada 2semanas hasta la semana36. A partir de ese momento le harn exmenes semanales. Durante una visita prenatal de rutina:  La pesarn para asegurarse de que usted y el feto estn creciendo normalmente.  Le tomarn la presin arterial.  Le medirn el abdomen para controlar el desarrollo del beb.  Se escucharn los latidos cardacos fetales.  Se evaluarn los resultados de los estudios solicitados en visitas anteriores.  Le revisarn el cuello del tero cuando est prxima la fecha de parto para controlar si este se ha borrado. Alrededor de la semana36, el mdico le revisar el cuello del tero. Al mismo tiempo, realizar un anlisis de las secreciones del tejido vaginal. Este examen es para determinar si hay un tipo de bacteria, estreptococo Grupo B. El mdico le explicar esto con ms detalle. El mdico puede preguntarle lo siguiente:  Cmo le gustara que fuera el parto.  Cmo se siente.  Si siente los movimientos del beb.  Si ha tenido sntomas anormales, como prdida de lquido, sangrado, dolores de cabeza intensos o clicos abdominales.  Si tiene alguna pregunta. Otros exmenes o estudios de deteccin que pueden realizarse   durante el tercer trimestre incluyen lo siguiente:  Anlisis de sangre para controlar las concentraciones de hierro (anemia).  Controles fetales para determinar su salud, nivel de actividad y crecimiento. Si tiene alguna enfermedad o hay problemas durante el embarazo, le harn estudios. FALSO TRABAJO DE PARTO Es posible que sienta contracciones leves e irregulares que finalmente desaparecen. Se llaman contracciones de  Braxton Hicks o falso trabajo de parto. Las contracciones pueden durar horas, das o incluso semanas, antes de que el verdadero trabajo de parto se inicie. Si las contracciones ocurren a intervalos regulares, se intensifican o se hacen dolorosas, lo mejor es que la revise el mdico.  SIGNOS DE TRABAJO DE PARTO   Clicos de tipo menstrual.  Contracciones cada 5minutos o menos.  Contracciones que comienzan en la parte superior del tero y se extienden hacia abajo, a la zona inferior del abdomen y la espalda.  Sensacin de mayor presin en la pelvis o dolor de espalda.  Una secrecin de mucosidad acuosa o con sangre que sale de la vagina. Si tiene alguno de estos signos antes de la semana37 del embarazo, llame a su mdico de inmediato. Debe concurrir al hospital para que la controlen inmediatamente. INSTRUCCIONES PARA EL CUIDADO EN EL HOGAR   Evite fumar, consumir hierbas, beber alcohol y tomar frmacos que no le hayan recetado. Estas sustancias qumicas afectan la formacin y el desarrollo del beb.  Siga las indicaciones del mdico en relacin con el uso de medicamentos. Durante el embarazo, hay medicamentos que son seguros de tomar y otros que no.  Haga actividad fsica solo en la forma indicada por el mdico. Sentir clicos uterinos es un buen signo para detener la actividad fsica.  Contine comiendo alimentos que sanos con regularidad.  Use un sostn que le brinde buen soporte si le duelen las mamas.  No se d baos de inmersin en agua caliente, baos turcos ni saunas.  Colquese el cinturn de seguridad cuando conduzca.  No coma carne cruda ni queso sin cocinar; evite el contacto con las bandejas sanitarias de los gatos y la tierra que estos animales usan. Estos elementos contienen grmenes que pueden causar defectos congnitos en el beb.  Tome las vitaminas prenatales.  Si est estreida, pruebe un laxante suave (si el mdico lo autoriza). Consuma ms alimentos ricos en  fibra, como vegetales y frutas frescos y cereales integrales. Beba gran cantidad de lquido para mantener la orina de tono claro o color amarillo plido.  Dese baos de asiento con agua tibia para aliviar el dolor o las molestias causadas por las hemorroides. Use una crema para las hemorroides si el mdico la autoriza.  Si tiene venas varicosas, use medias de descanso. Eleve los pies durante 15minutos, 3 o 4veces por da. Limite la cantidad de sal en su dieta.  Evite levantar objetos pesados, use zapatos de tacones bajos y mantenga una buena postura.  Descanse con las piernas elevadas si tiene calambres o dolor de cintura.  Visite a su dentista si no lo ha hecho durante el embarazo. Use un cepillo de dientes blando para higienizarse los dientes y psese el hilo dental con suavidad.  Puede seguir manteniendo relaciones sexuales, a menos que el mdico le indique lo contrario.  No haga viajes largos excepto que sea absolutamente necesario y solo con la autorizacin del mdico.  Tome clases prenatales para entender, practicar y hacer preguntas sobre el trabajo de parto y el parto.  Haga un ensayo de la partida al hospital.  Prepare el bolso que   llevar al hospital.  Prepare la habitacin del beb.  Concurra a todas las visitas prenatales segn las indicaciones de su mdico. SOLICITE ATENCIN MDICA SI:  No est segura de que est en trabajo de parto o de que ha roto la bolsa de las aguas.  Tiene mareos.  Siente clicos leves, presin en la pelvis o dolor persistente en el abdomen.  Tiene nuseas, vmitos o diarrea persistentes.  Tiene secrecin vaginal con mal olor.  Siente dolor al orinar. SOLICITE ATENCIN MDICA DE INMEDIATO SI:   Tiene fiebre.  Tiene una prdida de lquido por la vagina.  Tiene sangrado o pequeas prdidas vaginales.  Siente dolor intenso o clicos en el abdomen.  Sube o baja de peso rpidamente.  Tiene dificultad para respirar y siente dolor de  pecho.  Sbitamente se le hinchan mucho el rostro, las manos, los tobillos, los pies o las piernas.  No ha sentido los movimientos del beb durante una hora.  Siente un dolor de cabeza intenso que no se alivia con medicamentos.  Hay cambios en la visin. Document Released: 10/10/2004 Document Revised: 01/05/2013 [employer] Patient Information 2015 [employer]. This information is not intended to replace advice given to you by your health care Coye Dawood. Make sure you discuss any questions you have with your health care Everlee Quakenbush.  Lactancia materna (Breastfeeding) Decidir amamantar es una de las mejores elecciones que puede hacer por usted y su beb. El cambio hormonal durante el embarazo produce el desarrollo del tejido mamario y aumenta la cantidad y el tamao de los conductos galactforos. Estas hormonas tambin permiten que las protenas, los azcares y las grasas de la sangre produzcan la leche materna en las glndulas productoras de leche. Las hormonas impiden que la leche materna sea liberada antes del nacimiento del beb, adems de impulsar el flujo de leche luego del nacimiento. Una vez que ha comenzado a amamantar, pensar en el beb, as como la succin o el llanto, pueden estimular la liberacin de leche de las glndulas productoras de leche.  LOS BENEFICIOS DE AMAMANTAR Para el beb  La primera leche (calostro) ayuda a mejorar el funcionamiento del sistema digestivo del beb.  La leche tiene anticuerpos que ayudan a prevenir las infecciones en el beb.  El beb tiene una menor incidencia de asma, alergias y del sndrome de muerte sbita del lactante.  Los nutrientes en la leche materna son mejores para el beb que la leche maternizada y estn preparados exclusivamente para cubrir las necesidades del beb.  La leche materna mejora el desarrollo cerebral del beb.  Es menos probable que el beb desarrolle otras enfermedades, como obesidad infantil, asma o diabetes mellitus de  tipo 2. Para usted   La lactancia materna favorece el desarrollo de un vnculo muy especial entre la madre y el beb.  Es conveniente. La leche materna siempre est disponible a la temperatura correcta y es econmica.  La lactancia materna ayuda a quemar caloras y a perder el peso ganado durante el embarazo.  Favorece la contraccin del tero al tamao que tena antes del embarazo de manera ms rpida y disminuye el sangrado (loquios) despus del parto.  La lactancia materna contribuye a reducir el riesgo de desarrollar diabetes mellitus de tipo 2, osteoporosis o cncer de mama o de ovario en el futuro. SIGNOS DE QUE EL BEB EST HAMBRIENTO Primeros signos de hambre  Aumenta su estado de alerta o actividad.  Se estira.  Mueve la cabeza de un lado a otro.  Mueve la cabeza y   abre la boca cuando se le toca la mejilla o la comisura de la boca (reflejo de bsqueda).  Aumenta las vocalizaciones, tales como sonidos de succin, se relame los labios, emite arrullos, suspiros, o chirridos.  Mueve la mano hacia la boca.  Se chupa con ganas los dedos o las manos. Signos tardos de hambre  Est agitado.  Llora de manera intermitente. Signos de hambre extrema Los signos de hambre extrema requerirn que lo calme y lo consuele antes de que el beb pueda alimentarse adecuadamente. No espere a que se manifiesten los siguientes signos de hambre extrema para comenzar a amamantar:   Agitacin.  Llanto intenso y fuerte.   Gritos. INFORMACIN BSICA SOBRE LA LACTANCIA MATERNA Iniciacin de la lactancia materna  Encuentre un lugar cmodo para sentarse o acostarse, con un buen respaldo para el cuello y la espalda.  Coloque una almohada o una manta enrollada debajo del beb para acomodarlo a la altura de la mama (si est sentada). Las almohadas para amamantar se han diseado especialmente a fin de servir de apoyo para los brazos y el beb mientras amamanta.  Asegrese de que el abdomen del  beb est frente al suyo.  Masajee suavemente la mama. Con las yemas de los dedos, masajee la pared del pecho hacia el pezn en un movimiento circular. Esto estimula el flujo de leche. Es posible que deba continuar este movimiento mientras amamanta si la leche fluye lentamente.  Sostenga la mama con el pulgar por arriba del pezn y los otros 4 dedos por debajo de la mama. Asegrese de que los dedos se encuentren lejos del pezn y de la boca del beb.  Empuje suavemente los labios del beb con el pezn o con el dedo.  Cuando la boca del beb se abra lo suficiente, acrquelo rpidamente a la mama e introduzca todo el pezn y la zona oscura que lo rodea (areola), tanto como sea posible, dentro de la boca del beb.  Debe haber ms areola visible por arriba del labio superior del beb que por debajo del labio inferior.  La lengua del beb debe estar entre la enca inferior y la mama.  Asegrese de que la boca del beb est en la posicin correcta alrededor del pezn (prendida). Los labios del beb deben crear un sello sobre la mama y estar doblados hacia afuera (invertidos).  Es comn que el beb succione durante 2 a 3 minutos para que comience el flujo de leche materna. Cmo debe prenderse Es muy importante que le ensee al beb cmo prenderse adecuadamente a la mama. Si el beb no se prende adecuadamente, puede causarle dolor en el pezn y reducir la produccin de leche materna, y hacer que el beb tenga un escaso aumento de peso. Adems, si el beb no se prende adecuadamente al pezn, puede tragar aire durante la alimentacin. Esto puede causarle molestias al beb. Hacer eructar al beb al cambiar de mama puede ayudarlo a liberar el aire. Sin embargo, ensearle al beb cmo prenderse a la mama adecuadamente es la mejor manera de evitar que se sienta molesto por tragar aire mientras se alimenta. Signos de que el beb se ha prendido adecuadamente al pezn:   Tironea o succiona de modo silencioso,  sin causarle dolor.  Se escucha que traga cada 3 o 4 succiones.   Hay movimientos musculares por arriba y por delante de sus odos al succionar. Signos de que el beb no se ha prendido adecuadamente al pezn:   Hace ruidos de succin o   de chasquido mientras se alimenta.  Siente dolor en el pezn. Si cree que el beb no se prendi correctamente, deslice el dedo en la comisura de la boca y colquelo entre las encas del beb para interrumpir la succin. Intente comenzar a amamantar nuevamente. Signos de lactancia materna exitosa Signos del beb:   Disminuye gradualmente el nmero de succiones o cesa la succin por completo.  Se duerme.  Relaja el cuerpo.  Retiene una pequea cantidad de leche en la boca.  Se desprende solo del pecho. Signos que presenta usted:  Las mamas han aumentado la firmeza, el peso y el tamao 1 a 3 horas despus de amamantar.  Estn ms blandas inmediatamente despus de amamantar.  Un aumento del volumen de leche, y tambin un cambio en su consistencia y color se producen hacia el quinto da de lactancia materna.  Los pezones no duelen, ni estn agrietados ni sangran. Signos de que su beb recibe la cantidad de leche suficiente  Moja al menos 3 paales en 24 horas. La orina debe ser clara y de color amarillo plido a los 5 das de vida.  Defeca al menos 3 veces en 24 horas a los 5 das de vida. La materia fecal debe ser blanda y amarillenta.  Defeca al menos 3 veces en 24 horas a los 7 das de vida. La materia fecal debe ser grumosa y amarillenta.  No registra una prdida de peso mayor del 10% del peso al nacer durante los primeros 3 das de vida.  Aumenta de peso un promedio de 4 a 7onzas (113 a 198g) por semana despus de los 4 das de vida.  Aumenta de peso, diariamente, de manera uniforme a partir de los 5 das de vida, sin registrar prdida de peso despus de las 2semanas de vida. Despus de alimentarse, es posible que el beb regurgite una  pequea cantidad. Esto es frecuente. FRECUENCIA Y DURACIN DE LA LACTANCIA MATERNA El amamantamiento frecuente la ayudar a producir ms leche y a prevenir problemas de dolor en los pezones e hinchazn en las mamas. Alimente al beb cuando muestre signos de hambre o si siente la necesidad de reducir la congestin de las mamas. Esto se denomina "lactancia a demanda". Evite el uso del chupete mientras trabaja para establecer la lactancia (las primeras 4 a 6 semanas despus del nacimiento del beb). Despus de este perodo, podr ofrecerle un chupete. Las investigaciones demostraron que el uso del chupete durante el primer ao de vida del beb disminuye el riesgo de desarrollar el sndrome de muerte sbita del lactante (SMSL). Permita que el nio se alimente en cada mama todo lo que desee. Contine amamantando al beb hasta que haya terminado de alimentarse. Cuando el beb se desprende o se queda dormido mientras se est alimentando de la primera mama, ofrzcale la segunda. Debido a que, con frecuencia, los recin nacidos permanecen somnolientos las primeras semanas de vida, es posible que deba despertar al beb para alimentarlo. Los horarios de lactancia varan de un beb a otro. Sin embargo, las siguientes reglas pueden servir como gua para ayudarla a garantizar que el beb se alimenta adecuadamente:  Se puede amamantar a los recin nacidos (bebs de 4 semanas o menos de vida) cada 1 a 3 horas.  No deben transcurrir ms de 3 horas durante el da o 5 horas durante la noche sin que se amamante a los recin nacidos.  Debe amamantar al beb 8 veces como mnimo en un perodo de 24 horas, hasta que comience a   introducir slidos en su dieta, a los 6 meses de vida aproximadamente. EXTRACCIN DE LECHE MATERNA La extraccin y el almacenamiento de la leche materna le permiten asegurarse de que el beb se alimente exclusivamente de leche materna, aun en momentos en los que no puede amamantar. Esto tiene especial  importancia si debe regresar al trabajo en el perodo en que an est amamantando o si no puede estar presente en los momentos en que el beb debe alimentarse. Su asesor en lactancia puede orientarla sobre cunto tiempo es seguro almacenar leche materna.  El sacaleche es un aparato que le permite extraer leche de la mama a un recipiente estril. Luego, la leche materna extrada puede almacenarse en un refrigerador o congelador. Algunos sacaleches son manuales, mientras que otros son elctricos. Consulte a su asesor en lactancia qu tipo ser ms conveniente para usted. Los sacaleches se pueden comprar; sin embargo, algunos hospitales y grupos de apoyo a la lactancia materna alquilan sacaleches mensualmente. Un asesor en lactancia puede ensearle cmo extraer leche materna manualmente, en caso de que prefiera no usar un sacaleche.  CMO CUIDAR LAS MAMAS DURANTE LA LACTANCIA MATERNA Los pezones se secan, agrietan y duelen durante la lactancia materna. Las siguientes recomendaciones pueden ayudarla a mantener las mamas humectadas y sanas:  Evite usar jabn en los pezones.  Use un sostn de soporte. Aunque no son esenciales, las camisetas sin mangas o los sostenes especiales para amamantar estn diseados para acceder fcilmente a las mamas, para amamantar sin tener que quitarse todo el sostn o la camiseta. Evite usar sostenes con aro o sostenes muy ajustados.  Seque al aire sus pezones durante 3 a 4minutos despus de amamantar al beb.  Utilice solo apsitos de algodn en el sostn para absorber las prdidas de leche. La prdida de un poco de leche materna entre las tomas es normal.  Utilice lanolina sobre los pezones luego de amamantar. La lanolina ayuda a mantener la humedad normal de la piel. Si usa lanolina pura, no tiene que lavarse los pezones antes de volver a alimentar al beb. La lanolina pura no es txica para el beb. Adems, puede extraer manualmente algunas gotas de leche materna y masajear  suavemente esa leche sobre los pezones, para que la leche se seque al aire. Durante las primeras semanas despus de dar a luz, algunas mujeres pueden experimentar hinchazn en las mamas (congestin mamaria). La congestin puede hacer que sienta las mamas pesadas, calientes y sensibles al tacto. El pico de la congestin ocurre dentro de los 3 a 5 das despus del parto. Las siguientes recomendaciones pueden ayudarla a aliviar la congestin:  Vace por completo las mamas al amamantar o extraer leche. Puede aplicar calor hmedo en las mamas (en la ducha o con toallas hmedas para manos) antes de amamantar o extraer leche. Esto aumenta la circulacin y ayuda a que la leche fluya. Si el beb no vaca por completo las mamas cuando lo amamanta, extraiga la leche restante despus de que haya finalizado.  Use un sostn ajustado (para amamantar o comn) o una camiseta sin mangas durante 1 o 2 das para indicar al cuerpo que disminuya ligeramente la produccin de leche.  Aplique compresas de hielo sobre las mamas, a menos que le resulte demasiado incmodo.  Asegrese de que el beb est prendido y se encuentre en la posicin correcta mientras lo alimenta. Si la congestin persiste luego de 48 horas o despus de seguir estas recomendaciones, comunquese con su mdico o un asesor en lactancia. RECOMENDACIONES GENERALES   PARA EL CUIDADO DE LA SALUD DURANTE LA LACTANCIA MATERNA  Consuma alimentos saludables. Alterne comidas y colaciones, y coma 3 de cada una por da. Dado que lo que come afecta la leche materna, es posible que algunas comidas hagan que su beb se vuelva ms irritable de lo habitual. Evite comer este tipo de alimentos si percibe que afectan de manera negativa al beb.  Beba leche, jugos de fruta y agua para satisfacer su sed (aproximadamente 10 vasos al da).  Descanse con frecuencia, reljese y tome sus vitaminas prenatales para evitar la fatiga, el estrs y la anemia.  Contine con los  autocontroles de la mama.  Evite masticar y fumar tabaco.  Evite el consumo de alcohol y drogas. Algunos medicamentos, que pueden ser perjudiciales para el beb, pueden pasar a travs de la leche materna. Es importante que consulte a su mdico antes de tomar cualquier medicamento, incluidos todos los medicamentos recetados y de venta libre, as como los suplementos vitamnicos y herbales. Puede quedar embarazada durante la lactancia. Si desea controlar la natalidad, consulte a su mdico cules son las opciones ms seguras para el beb. SOLICITE ATENCIN MDICA SI:   Usted siente que quiere dejar de amamantar o se siente frustrada con la lactancia.  Siente dolor en las mamas o en los pezones.  Sus pezones estn agrietados o sangran.  Sus pechos estn irritados, sensibles o calientes.  Tiene un rea hinchada en cualquiera de las mamas.  Siente escalofros o fiebre.  Tiene nuseas o vmitos.  Presenta una secrecin de otro lquido distinto de la leche materna de los pezones.  Sus mamas no se llenan antes de amamantar al beb para el quinto da despus del parto.  Se siente triste y deprimida.  El beb est demasiado somnoliento como para comer bien.  El beb tiene problemas para dormir.  Moja menos de 3 paales en 24 horas.  Defeca menos de 3 veces en 24 horas.  La piel del beb o la parte blanca de los ojos se vuelven amarillentas.  El beb no ha aumentado de peso a los 5 das de vida. SOLICITE ATENCIN MDICA DE INMEDIATO SI:   El beb est muy cansado (letargo) y no se quiere despertar para comer.  Le sube la fiebre sin causa. Document Released: 12/31/2004 Document Revised: 01/05/2013 [employer] Patient Information 2015 [employer]. This information is not intended to replace advice given to you by your health care Yaeli Hartung. Make sure you discuss any questions you have with your health care Katriona Schmierer.  

## 2014-10-10 NOTE — Progress Notes (Signed)
Interpreter Maretta Los present for encounter.  Rpt C/S on 10/4 scheduled.

## 2014-10-10 NOTE — Progress Notes (Signed)
Subjective:  Kayla Scott is a 39 y.o. [id number] at [redacted]w[redacted]d being seen today for ongoing prenatal care.  Patient reports no complaints.  Contractions: Irregular.   . Movement: Present. Denies leaking of fluid.   The following portions of the patient's history were reviewed and updated as appropriate: allergies, current medications, past family history, past medical history, past social history, past surgical history and problem list.   Objective:   Filed Vitals:   10/10/14 0751  BP: 113/73  Pulse: 105  Weight: 262 lb 3.2 oz (118.933 kg)    Fetal Status: Fetal Heart Rate (bpm): NST   Movement: Present  Presentation: Vertex  General:  Alert, oriented and cooperative. Patient is in no acute distress.  Skin: Skin is warm and dry. No rash noted.   Cardiovascular: Normal heart rate noted  Respiratory: Normal respiratory effort, no problems with respiration noted  Abdomen: Soft, gravid, appropriate for gestational age. Pain/Pressure: Present     Pelvic:       Cervical exam deferred        Extremities: Normal range of motion.  Edema: None  Mental Status: Normal mood and affect. Normal behavior. Normal judgment and thought content.   Urinalysis: Neg prot, neg glucose, large leukocytes, trace Hgb     U/s 9/19  6 lbs 3 oz 47%, AFI 7.86, vertex, CPAM not visualized at u/s needs f/u postnatally NST reviewed and reactive. FBS 63-85 2 hour pp 62-124 (1 out of range) Assessment and Plan:  Pregnancy: [id number] at [redacted]w[redacted]d  1. Gestational diabetes mellitus, antepartum BS are well controlled, continue Glyburide Stop Baby ASA Continue 2x/wk testing - Amniotic fluid index with NST  2. Supervision of high risk pregnancy, antepartum, third trimester Continue routine prenatal care.   3. Previous cesarean section complicating pregnancy, antepartum condition or complication For ERLTCS on 10/4  4. Hematuria - Culture, OB Urine  Term labor symptoms and general obstetric precautions  including but not limited to vaginal bleeding, contractions, leaking of fluid and fetal movement were reviewed in detail with the patient. Please refer to After Visit Summary for other counseling recommendations.  Return in 7 weeks (on 11/28/2014) for pp check, NST Thursday, OB visit with NST and  AFI on Monday.   Reva Bores, MD

## 2014-10-11 ENCOUNTER — Inpatient Hospital Stay (HOSPITAL_COMMUNITY)
Admission: AD | Admit: 2014-10-11 | Discharge: 2014-10-12 | Disposition: A | Discharge status: Home / Self Care | Payer: Self-pay | Source: Ambulatory Visit | Attending: Obstetrics and Gynecology | Admitting: Obstetrics and Gynecology

## 2014-10-11 ENCOUNTER — Encounter (HOSPITAL_COMMUNITY): Payer: Self-pay | Admitting: *Deleted

## 2014-10-11 DIAGNOSIS — O24419 Gestational diabetes mellitus in pregnancy, unspecified control: Secondary | ICD-10-CM

## 2014-10-11 DIAGNOSIS — Z3A38 38 weeks gestation of pregnancy: Secondary | ICD-10-CM | POA: Insufficient documentation

## 2014-10-11 DIAGNOSIS — O34219 Maternal care for unspecified type scar from previous cesarean delivery: Secondary | ICD-10-CM

## 2014-10-11 DIAGNOSIS — Z7982 Long term (current) use of aspirin: Secondary | ICD-10-CM | POA: Insufficient documentation

## 2014-10-11 DIAGNOSIS — K529 Noninfective gastroenteritis and colitis, unspecified: Secondary | ICD-10-CM | POA: Insufficient documentation

## 2014-10-11 DIAGNOSIS — R197 Diarrhea, unspecified: Secondary | ICD-10-CM | POA: Insufficient documentation

## 2014-10-11 DIAGNOSIS — O99613 Diseases of the digestive system complicating pregnancy, third trimester: Secondary | ICD-10-CM | POA: Insufficient documentation

## 2014-10-11 MED ORDER — DICYCLOMINE HCL 10 MG PO CAPS
10.0000 mg | ORAL_CAPSULE | Freq: Once | ORAL | Status: AC
  Administered 2014-10-11: 10 mg via ORAL
  Filled 2014-10-11: qty 1

## 2014-10-11 MED ORDER — PROMETHAZINE HCL 25 MG PO TABS
25.0000 mg | ORAL_TABLET | Freq: Once | ORAL | Status: AC
  Administered 2014-10-11: 25 mg via ORAL
  Filled 2014-10-11: qty 1

## 2014-10-11 NOTE — MAU Note (Signed)
Pt reports contractions, denies bleeding  

## 2014-10-11 NOTE — MAU Provider Note (Signed)
History     CSN: [id number]  Arrival date and time: 10/11/14 2203   First Provider Initiated Contact with Patient 10/11/14 2239      No chief complaint on file.  HPI  Patient is 39 y.o. [id number] [redacted]w[redacted]d here with complaints of diarrhea.  Ms. Ermalinda Memos reports that this morning ~15 hrs ago she began to experience diarrhea. She thinks she has had ~12 episodes of diarrhea since then. She denies blood in her diarrhea. Denies vomiting. Endorses chills but denies fever. Endorses abdominal pain and distention. She does not know if she is having contractions or if she is simply feeling abdominal pain from her diarrhea. She has not taken anything to help with her symptoms.  Of note, her son and daughter have been vomiting since midnight. Her son has been febrile. His symptoms are improving with Immodium. Her daughter's symptoms have improved as well.  +FM, denies LOF, VB, vaginal discharge.   Past Medical History  Diagnosis Date  . UTI (lower urinary tract infection)   . Diabetes mellitus without complication 2016  . Gestational diabetes     Past Surgical History  Procedure Laterality Date  . Cesarean section      Family History  Problem Relation Age of Onset  . Diabetes Mother   . Cancer Paternal Grandmother     uterine    Social History  Substance Use Topics  . Smoking status: Never Smoker   . Smokeless tobacco: Never Used  . Alcohol Use: No    Allergies:  Allergies  Allergen Reactions  . Metformin And Related Shortness Of Breath    Felt like throat was swelling    Prescriptions prior to admission  Medication Sig Dispense Refill Last Dose  . aspirin EC 81 MG tablet Take 1 tablet (81 mg total) by mouth daily. 30 tablet 3 Taking  . glyBURIDE (DIABETA) 2.5 MG tablet Take 0.5 tablets (1.25 mg total) by mouth 2 (two) times daily with a meal. 60 tablet 3 Taking  . Prenatal Vit-Fe Fumarate-FA (PRENATAL COMPLETE) 14-0.4 MG TABS Take 1 tablet by mouth daily. 60 each 3  Taking    Review of Systems  Constitutional: Positive for chills. Negative for fever.  Eyes:       "seeing lights"  Cardiovascular: Positive for leg swelling.  Gastrointestinal: Positive for nausea, abdominal pain and diarrhea. Negative for vomiting, constipation and blood in stool.  Neurological: Positive for weakness and headaches.   Physical Exam   Blood pressure 129/88, pulse 100, temperature 98.6 F (37 C), temperature source Oral, resp. rate 18, height  (1.6 m), weight 259 lb (117.482 kg), last menstrual period 12/31/2013, SpO2 100 %.  Physical Exam  Constitutional: She is oriented to person, place, and time. She appears well-developed and well-nourished. No distress.  HENT:  Head: Normocephalic and atraumatic.  Cardiovascular:  Slightly tachycardic  Respiratory: Effort normal. No respiratory distress.  GI: Soft. There is tenderness (diffuse). There is no rebound and no guarding.  Gravid  Musculoskeletal: She exhibits edema (2+ bilaterally to midshin). She exhibits no tenderness.  Neurological: She is alert and oriented to person, place, and time. No cranial nerve deficit.  Skin: Skin is warm and dry.  Psychiatric: She has a normal mood and affect. Her behavior is normal.    MAU Course  Procedures None  MDM Bentyl and phenergan - nausea improved Few contractions at the beginning of MAU observation  Assessment and Plan  A: Patient is 40 y.o. [id number] [redacted]w[redacted]d reporting diarrhea likely secondary to  viral gastroenteritis given patient's sick contacts.   P: Discharge home - Reviewed findings and my conclusion - Handout given regarding viral gastroenteritis and preterm labor - Follow-up with OB provider - Instructed patient to take Tylenol for abdominal pain and Immodium if needed for diarrhea relief to sleep at night - Instructed patient to return if unable to tolerate liquids    Tarri Abernethy, MD PGY-1 [facility]  10/12/2014, 12:28 AM    Seen by me also Agree with note This is a 39 y.o. female at [redacted]w[redacted]d who presents with gastroenteritis We monitored her and she had some uterine irritability but no overt contractions Is not in labor Abdominal pain/cramping seems to be related to GI illness Dilation: Fingertip Effacement (%): 50 Cervical Position: Middle Station: -3 Exam by:: Ginger Morris RN Fetal heart rate is reassuring, Cat I Bentyl did not help cramping much Antiemetic did help nausea Recommend PO hydration at home with return if symptoms worsen or she becomes unable to keep anything down Followup in office for prenatal care Labor precautions. Aviva Signs, CNM

## 2014-10-12 DIAGNOSIS — O24419 Gestational diabetes mellitus in pregnancy, unspecified control: Secondary | ICD-10-CM

## 2014-10-12 LAB — CULTURE, OB URINE

## 2014-10-12 NOTE — Discharge Instructions (Signed)
Gastroenteritis viral (Viral Gastroenteritis)  La gastroenteritis viral tambin se llama gripe estomacal. La causa de esta enfermedad es un tipo de germen (virus). Puede provocar heces acuosas de manera repentina (diarrea) yvmitos. Esto puede llevar a la prdida de lquidos corporales(deshidratacin). Por lo general dura de 3 a 8 das. Generalmente desaparece sin tratamiento. CUIDADOS EN EL HOGAR  Beba gran cantidad de lquido para mantener el pis (orina) de tono claro o amarillo plido. Beba pequeas cantidades de lquido con frecuencia.  Consulte a su mdico como reponer la prdida de lquidos (rehidratacin).  Evite:  Alimentos que Nurse, adult.  El alcohol.  Las bebidas gaseosas (carbonatadas).  El tabaco.  Jugos.  Bebidas con cafena.  Lquidos muy calientes o fros.  Alimentos muy grasos.  Comer mucha cantidad por vez.  Productos lcteos hasta pasar 24 a 48 horas sin heces acuosas.  Puede consumir alimentos que tengan cultivos activos (probiticos). Estos cultivos puede encontrarlos en algunos tipos de yogur y suplementos.  Lave bien sus manos para evitar el contagio de la enfermedad.  Tome slo los medicamentos que le haya indicado el mdico. No administre aspirina a los nios. No tome medicamentos para mejorar la diarrea (antidiarreicos).  Consulte al mdico si puede seguir [employer] que [place] habitualmente.  Cumpla con los controles mdicos segn las indicaciones. SOLICITE AYUDA DE INMEDIATO SI:  No puede retener los lquidos.  No ha orinado al [employer] vez en 6 a 8 horas.  Comienza a sentir falta de aire.  Observa sangre en la orina, en las heces o en el vmito. Puede ser similar a la borra del caf  Siente dolor en el vientre (abdominal), que empeora o se sita en un pequeo punto (se localiza).  Contina vomitando o con diarrea.  Tiene fiebre.  El paciente es un nio menor de 3 meses y [place].  El paciente es un nio  mayor de 3 meses y tiene fiebre o problemas que no desaparecen.  El paciente es un nio mayor de 3 meses y tiene fiebre o problemas que empeoran repentinamente.  El paciente es un beb y no tiene lgrimas cuando llora. ASEGRESE QUE:   Comprende estas instrucciones.  Controlar su enfermedad.  Solicitar ayuda de inmediato si no mejora o si empeora. Document Released: 05/19/2008 Document Revised: 03/25/2011 [facility] Patient Information 2015 [place], [place]. This information is not intended to replace advice given to you by your health care provider. Make sure you discuss any questions you have with your health care provider. Ruptura prematura de [place] y ruptura prematura de membranas antes de trmino  (Premature Rupture and Preterm Premature Rupture of Membranes)  El beb dentro de la matriz (tero) se encuentra rodeado de una bolsa formada por membranas. Cuando esta bolsa se rompe antes de que comiencen las contracciones o el trabajo de [place], se considera una ruptura prematura de las membranas ([place]). La ruptura de las [address] se conoce como ruptura de la bolsa de [place]. Si esto ocurre antes de la semana 37, se denomina ruptura prematura de las [place] pretrmino (RPMP). La RPMP es grave. Es necesario que reciba atencin mdica de inmediato. CAUSAS  La causa de la RPM es el debilitamiento de las [place]. Esto ocurre [employer] final del [place]. Generalmente se debe a una infeccin, pero puede haber otras causas.  SIGNOS DE RPM O RPMP   Perdida repentina de lquido por la vagina.  Perdida lenta de lquido por la vagina.  Su ropa interior se moja. QU DEBE  HACER SI PIENSA QUE HA ROTO LA BOLSA DE AGUAS  Comunquese con su mdico de inmediato. Debe concurrir al hospital para ser controlada inmediatamente.  QU OCURRE SI LE DICEN QUE TIENE UNA RPM O RPMP?  Pollyann Savoy que llegue al hospital le harn [employer]. Si tiene una RPM, podr ser [employer] administren  medicamentos para comenzar el trabajo de parto (induccin). Esto puede ocurrir si no tiene Barrister's clerk de las 24 horas de que haya roto la bolsa de [place]. Si tiene [place] RPMP y no tiene English as a second language teacher, Psychologist, sport and exercise un medicamento para iniciar el [place] de [place]. Esto depender de la etapa en que se encuentre del embarazo.  Si tiene una RPMP, usted:   Y su beb sern observados estrechamente para buscar signos de infeccin u otros problemas.  Le recetarn antibiticos. Esto puede impedir que aparezca una infeccin.  Es posible que le indiquen corticoides. Esto hace que los pulmones se desarrollen ms rpidamente.  Es posible que le administren medicamentos para detener el trabajo de parto (trabajo de parto pretrmino).  Le indicarn que permanezca en la cama excepto para ir al bao (reposo en cama).  Es posible que le administren medicamentos para comenzar el trabajo de [place]. Esto ocurre si hay problemas con usted o el beb. El tratamiento depender de muchos factores.  Document Released: 09/02/2012 [facility] Patient Information 2015 [place], [place]. This information is not intended to replace advice given to you by your health care provider. Make sure you discuss any questions you have with your health care provider.

## 2014-10-13 ENCOUNTER — Ambulatory Visit (INDEPENDENT_AMBULATORY_CARE_PROVIDER_SITE_OTHER): Payer: Self-pay | Admitting: *Deleted

## 2014-10-13 DIAGNOSIS — O24419 Gestational diabetes mellitus in pregnancy, unspecified control: Secondary | ICD-10-CM

## 2014-10-13 NOTE — Progress Notes (Signed)
NST performed today was reviewed and was found to be reactive.  Continue recommended antenatal testing and prenatal care.  

## 2014-10-14 ENCOUNTER — Ambulatory Visit (HOSPITAL_COMMUNITY): Payer: Self-pay

## 2014-10-14 ENCOUNTER — Other Ambulatory Visit: Payer: Self-pay

## 2014-10-17 ENCOUNTER — Ambulatory Visit (INDEPENDENT_AMBULATORY_CARE_PROVIDER_SITE_OTHER): Payer: Self-pay | Admitting: Obstetrics & Gynecology

## 2014-10-17 ENCOUNTER — Encounter (HOSPITAL_COMMUNITY)
Admission: RE | Admit: 2014-10-17 | Discharge: 2014-10-17 | Disposition: A | Discharge status: Home / Self Care | Payer: Self-pay | Source: Ambulatory Visit | Attending: Obstetrics & Gynecology | Admitting: Obstetrics & Gynecology

## 2014-10-17 VITALS — BP 105/74 | HR 106 | Wt 262.7 lb

## 2014-10-17 DIAGNOSIS — Z01818 Encounter for other preprocedural examination: Secondary | ICD-10-CM | POA: Insufficient documentation

## 2014-10-17 DIAGNOSIS — O24419 Gestational diabetes mellitus in pregnancy, unspecified control: Secondary | ICD-10-CM

## 2014-10-17 LAB — CBC
HCT: 35.8 % — ABNORMAL LOW (ref 36.0–46.0)
Hemoglobin: 12.2 g/dL (ref 12.0–15.0)
MCH: 32.3 pg (ref 26.0–34.0)
MCHC: 34.1 g/dL (ref 30.0–36.0)
MCV: 94.7 fL (ref 78.0–100.0)
PLATELETS: 232 10*3/uL (ref 150–400)
RBC: 3.78 MIL/uL — AB (ref 3.87–5.11)
RDW: 14.6 % (ref 11.5–15.5)
WBC: 8.7 10*3/uL (ref 4.0–10.5)

## 2014-10-17 LAB — POCT URINALYSIS DIP (DEVICE)
GLUCOSE, UA: NEGATIVE mg/dL
NITRITE: NEGATIVE
PH: 6 (ref 5.0–8.0)
PROTEIN: 30 mg/dL — AB
Specific Gravity, Urine: 1.025 (ref 1.005–1.030)
UROBILINOGEN UA: 1 mg/dL (ref 0.0–1.0)

## 2014-10-17 LAB — BASIC METABOLIC PANEL
Anion gap: 4 — ABNORMAL LOW (ref 5–15)
BUN: 10 mg/dL (ref 6–20)
CALCIUM: 8.5 mg/dL — AB (ref 8.9–10.3)
CO2: 23 mmol/L (ref 22–32)
Chloride: 109 mmol/L (ref 101–111)
Creatinine, Ser: 0.55 mg/dL (ref 0.44–1.00)
GFR calc Af Amer: >60 mL/min (ref >60)
GLUCOSE: 130 mg/dL — AB (ref 65–99)
Potassium: 3.6 mmol/L (ref 3.5–5.1)
SODIUM: 136 mmol/L (ref 135–145)

## 2014-10-17 LAB — TYPE AND SCREEN
ABO/RH(D): O POS
ANTIBODY SCREEN: NEGATIVE

## 2014-10-17 NOTE — Patient Instructions (Addendum)
   Your procedure is scheduled on:  OCT 6   Enter through the Main Entrance of [facility] at:Instrucciones:  Su cirugia esta programada para-( your procedure is scheduled on) :_____1pm________  Eusebio Me por la entrada principal a la(s) -(enter through the main entrance at):_____________  [employer],  [place] el [phone] e informenos de su llegada ( pick up phone, dial [phone] on arrival)  Por favor llame al [phone] si tiene algun problema la Lily Kocher ( please call [phone] if you have any problems the morning of surgery.)  Recuerde: DO NOT EAT FOOD AFTER 5AM DAY OF SURGERY... NO CLEAR LIQUIDS AFTER 1030AM DAY OF SURGERY  No coma alimentos ni tome liquidos, incluyendo agua, despues de la medianoche del  ( Do not eat food or drink liquids including water after midnight on_______________  [employer] medicinas la manana de la cirugia con un sorbito de agua (take these meds the morning of surgery with a SIP of water)__________________________________  [employer] dientes en la manana de la [place]. (you may brush your teeth the morning of surgery)  NO use joyas, maquillaje de ojos, lapiz labial, crema para el cuerpo o esmalte de unas oscuro - las unas de los pies pueden estar pintados. ( Do not wear jewelry, eye makeup, lipstick, body lotion, or dark fingernail polish)  Puede usar desodorante ( you may wear deodorant)  Si va a ser ingresado despues de las [place], deje la [place] en el carro hasta que se le haya asignado una habitacion. ( If you are to be admitted after surgery, leave suitcase in car until your room has been assigned.)  A los pacientes que se les de de alta el mismo dia no se les permitira manejar a casa.  ( Patients discharged on the day of surgery will not be allowed to drive home)  Use ropa suelta y comoda de regreso a Technical sales engineer. ( wear loose comfortable clothes for ride home)  Firma del paciente (patient signature)  ______________________________________ Gerre Couch up the phone at the desk and dial [phone] and inform us of your arrival.  Please call this number if you have any problems the morning of surgery: [phone]  Remember: Do not eat food after midnight: Do not drink clear liquids after: Take these medicines the morning of surgery with a SIP OF WATER:  Do not wear jewelry, make-up, or FINGER nail polish No metal in your hair or on your body. Do not wear lotions, powders, perfumes.  You may wear deodorant.  Do not bring valuables to the hospital. Contacts, dentures or bridgework may not be worn into surgery.  Leave suitcase in the car. After Surgery it may be brought to your room. For patients being admitted to the hospital, checkout time is 11:00am the day of discharge.    Patients discharged on the day of surgery will not be allowed to drive home.

## 2014-10-17 NOTE — Progress Notes (Signed)
Used Interpreter Elna Breslow. C/o varicose veins - c/o some pain from varicose veinis in perineal area.

## 2014-10-17 NOTE — Progress Notes (Signed)
Kayla Scott royal assisted with pre-op visit.Marland Kitchen

## 2014-10-18 ENCOUNTER — Encounter (HOSPITAL_COMMUNITY): Payer: Self-pay | Admitting: Certified Registered Nurse Anesthetist

## 2014-10-18 ENCOUNTER — Inpatient Hospital Stay (HOSPITAL_COMMUNITY): Payer: Medicaid Other | Admitting: Anesthesiology

## 2014-10-18 ENCOUNTER — Encounter (HOSPITAL_COMMUNITY)
Admission: RE | Disposition: A | Discharge status: Home / Self Care | Payer: Self-pay | Source: Ambulatory Visit | Attending: Obstetrics & Gynecology

## 2014-10-18 ENCOUNTER — Inpatient Hospital Stay (HOSPITAL_COMMUNITY)
Admission: RE | Admit: 2014-10-18 | Discharge: 2014-10-20 | DRG: 765 | Disposition: A | Discharge status: Home / Self Care | Payer: Medicaid Other | Source: Ambulatory Visit | Attending: Obstetrics & Gynecology | Admitting: Obstetrics & Gynecology

## 2014-10-18 DIAGNOSIS — Z98891 History of uterine scar from previous surgery: Secondary | ICD-10-CM

## 2014-10-18 DIAGNOSIS — O34219 Maternal care for unspecified type scar from previous cesarean delivery: Secondary | ICD-10-CM | POA: Diagnosis not present

## 2014-10-18 DIAGNOSIS — O359XX0 Maternal care for (suspected) fetal abnormality and damage, unspecified, not applicable or unspecified: Secondary | ICD-10-CM | POA: Diagnosis present

## 2014-10-18 DIAGNOSIS — O24419 Gestational diabetes mellitus in pregnancy, unspecified control: Secondary | ICD-10-CM

## 2014-10-18 DIAGNOSIS — O24425 Gestational diabetes mellitus in childbirth, controlled by oral hypoglycemic drugs: Secondary | ICD-10-CM | POA: Diagnosis present

## 2014-10-18 DIAGNOSIS — O09529 Supervision of elderly multigravida, unspecified trimester: Secondary | ICD-10-CM

## 2014-10-18 DIAGNOSIS — O34211 Maternal care for low transverse scar from previous cesarean delivery: Secondary | ICD-10-CM | POA: Diagnosis present

## 2014-10-18 DIAGNOSIS — O09523 Supervision of elderly multigravida, third trimester: Secondary | ICD-10-CM

## 2014-10-18 DIAGNOSIS — Z3A39 39 weeks gestation of pregnancy: Secondary | ICD-10-CM

## 2014-10-18 DIAGNOSIS — Z302 Encounter for sterilization: Secondary | ICD-10-CM

## 2014-10-18 DIAGNOSIS — Z6841 Body Mass Index (BMI) 40.0 and over, adult: Secondary | ICD-10-CM | POA: Diagnosis not present

## 2014-10-18 DIAGNOSIS — Z8751 Personal history of pre-term labor: Secondary | ICD-10-CM

## 2014-10-18 DIAGNOSIS — O99214 Obesity complicating childbirth: Secondary | ICD-10-CM | POA: Diagnosis present

## 2014-10-18 DIAGNOSIS — Z833 Family history of diabetes mellitus: Secondary | ICD-10-CM | POA: Diagnosis not present

## 2014-10-18 HISTORY — DX: History of uterine scar from previous surgery: Z98.891

## 2014-10-18 LAB — GLUCOSE, CAPILLARY
GLUCOSE-CAPILLARY: 65 mg/dL (ref 65–99)
Glucose-Capillary: 76 mg/dL (ref 65–99)

## 2014-10-18 LAB — RPR: RPR: NONREACTIVE

## 2014-10-18 SURGERY — Surgical Case
Anesthesia: Spinal | Site: Abdomen | Laterality: Bilateral

## 2014-10-18 MED ORDER — SIMETHICONE 80 MG PO CHEW
80.0000 mg | CHEWABLE_TABLET | ORAL | Status: DC
  Administered 2014-10-20: 80 mg via ORAL
  Filled 2014-10-18: qty 1

## 2014-10-18 MED ORDER — CEFAZOLIN SODIUM-DEXTROSE 2-3 GM-% IV SOLR: 2.0000 g | INTRAVENOUS | Status: DC

## 2014-10-18 MED ORDER — SIMETHICONE 80 MG PO CHEW: 80.0000 mg | CHEWABLE_TABLET | ORAL | Status: DC | PRN

## 2014-10-18 MED ORDER — MENTHOL 3 MG MT LOZG: 1.0000 | LOZENGE | OROMUCOSAL | Status: DC | PRN

## 2014-10-18 MED ORDER — WITCH HAZEL-GLYCERIN EX PADS: 1.0000 "application " | MEDICATED_PAD | CUTANEOUS | Status: DC | PRN

## 2014-10-18 MED ORDER — MEPERIDINE HCL 25 MG/ML IJ SOLN: 6.2500 mg | INTRAMUSCULAR | Status: DC | PRN

## 2014-10-18 MED ORDER — OXYCODONE-ACETAMINOPHEN 5-325 MG PO TABS: 1.0000 | ORAL_TABLET | ORAL | Status: DC | PRN

## 2014-10-18 MED ORDER — CEFAZOLIN SODIUM-DEXTROSE 2-3 GM-% IV SOLR
INTRAVENOUS | Status: DC | PRN
  Administered 2014-10-18: 2 g via INTRAVENOUS

## 2014-10-18 MED ORDER — LACTATED RINGERS IV SOLN
Freq: Once | INTRAVENOUS | Status: AC
  Administered 2014-10-18 (×2): via INTRAVENOUS

## 2014-10-18 MED ORDER — LANOLIN HYDROUS EX OINT: 1.0000 "application " | TOPICAL_OINTMENT | CUTANEOUS | Status: DC | PRN

## 2014-10-18 MED ORDER — DIPHENHYDRAMINE HCL 25 MG PO CAPS: 25.0000 mg | ORAL_CAPSULE | ORAL | Status: DC | PRN

## 2014-10-18 MED ORDER — KETOROLAC TROMETHAMINE 30 MG/ML IJ SOLN
30.0000 mg | Freq: Four times a day (QID) | INTRAMUSCULAR | Status: AC | PRN
  Administered 2014-10-18: 30 mg via INTRAVENOUS
  Filled 2014-10-18: qty 1

## 2014-10-18 MED ORDER — ACETAMINOPHEN 500 MG PO TABS
1000.0000 mg | ORAL_TABLET | Freq: Four times a day (QID) | ORAL | Status: AC
  Administered 2014-10-19 (×2): 1000 mg via ORAL
  Filled 2014-10-18 (×2): qty 2

## 2014-10-18 MED ORDER — DIPHENHYDRAMINE HCL 50 MG/ML IJ SOLN: 12.5000 mg | INTRAMUSCULAR | Status: DC | PRN

## 2014-10-18 MED ORDER — KETOROLAC TROMETHAMINE 30 MG/ML IJ SOLN: 30.0000 mg | Freq: Once | INTRAMUSCULAR | Status: DC | PRN

## 2014-10-18 MED ORDER — BUPIVACAINE IN DEXTROSE 0.75-8.25 % IT SOLN
INTRATHECAL | Status: DC | PRN
  Administered 2014-10-18: 1.5 mL via INTRATHECAL

## 2014-10-18 MED ORDER — ZOLPIDEM TARTRATE 5 MG PO TABS: 5.0000 mg | ORAL_TABLET | Freq: Every evening | ORAL | Status: DC | PRN

## 2014-10-18 MED ORDER — IBUPROFEN 600 MG PO TABS
600.0000 mg | ORAL_TABLET | Freq: Four times a day (QID) | ORAL | Status: DC
  Administered 2014-10-19 – 2014-10-20 (×6): 600 mg via ORAL
  Filled 2014-10-18 (×6): qty 1

## 2014-10-18 MED ORDER — OXYTOCIN 40 UNITS IN LACTATED RINGERS INFUSION - SIMPLE MED: 62.5000 mL/h | INTRAVENOUS | Status: AC

## 2014-10-18 MED ORDER — MORPHINE SULFATE (PF) 0.5 MG/ML IJ SOLN
INTRAMUSCULAR | Status: DC | PRN
  Administered 2014-10-18: 4 mg via EPIDURAL

## 2014-10-18 MED ORDER — NALOXONE HCL 1 MG/ML IJ SOLN: 1.0000 ug/kg/h | INTRAVENOUS | Status: DC | PRN

## 2014-10-18 MED ORDER — TETANUS-DIPHTH-ACELL PERTUSSIS 5-2.5-18.5 LF-MCG/0.5 IM SUSP
0.5000 mL | Freq: Once | INTRAMUSCULAR | Status: AC
  Administered 2014-10-19: 0.5 mL via INTRAMUSCULAR
  Filled 2014-10-18: qty 0.5

## 2014-10-18 MED ORDER — NALOXONE HCL 0.4 MG/ML IJ SOLN: 0.4000 mg | INTRAMUSCULAR | Status: DC | PRN

## 2014-10-18 MED ORDER — NALBUPHINE HCL 10 MG/ML IJ SOLN: 5.0000 mg | Freq: Once | INTRAMUSCULAR | Status: DC | PRN

## 2014-10-18 MED ORDER — SIMETHICONE 80 MG PO CHEW
80.0000 mg | CHEWABLE_TABLET | Freq: Three times a day (TID) | ORAL | Status: DC
  Administered 2014-10-19 – 2014-10-20 (×3): 80 mg via ORAL
  Filled 2014-10-18 (×3): qty 1

## 2014-10-18 MED ORDER — CEFAZOLIN SODIUM-DEXTROSE 2-3 GM-% IV SOLR
INTRAVENOUS | Status: AC
  Filled 2014-10-18: qty 50

## 2014-10-18 MED ORDER — OXYTOCIN 10 UNIT/ML IJ SOLN
40.0000 [IU] | INTRAVENOUS | Status: DC | PRN
  Administered 2014-10-18: 40 [IU] via INTRAVENOUS

## 2014-10-18 MED ORDER — PHENYLEPHRINE 8 MG IN D5W 100 ML (0.08MG/ML) PREMIX OPTIME
INJECTION | INTRAVENOUS | Status: DC | PRN
  Administered 2014-10-18: 60 ug/min via INTRAVENOUS

## 2014-10-18 MED ORDER — DIBUCAINE 1 % RE OINT: 1.0000 "application " | TOPICAL_OINTMENT | RECTAL | Status: DC | PRN

## 2014-10-18 MED ORDER — LACTATED RINGERS IV SOLN
INTRAVENOUS | Status: DC
  Administered 2014-10-18 (×2): via INTRAVENOUS

## 2014-10-18 MED ORDER — MEPERIDINE HCL 25 MG/ML IJ SOLN
6.2500 mg | INTRAMUSCULAR | Status: DC | PRN
Start: 2014-10-18 — End: 2014-10-18

## 2014-10-18 MED ORDER — ONDANSETRON HCL 4 MG/2ML IJ SOLN: 4.0000 mg | Freq: Three times a day (TID) | INTRAMUSCULAR | Status: DC | PRN

## 2014-10-18 MED ORDER — SCOPOLAMINE 1 MG/3DAYS TD PT72
1.0000 | MEDICATED_PATCH | Freq: Once | TRANSDERMAL | Status: DC
  Administered 2014-10-18: 1.5 mg via TRANSDERMAL

## 2014-10-18 MED ORDER — BUPIVACAINE HCL (PF) 0.5 % IJ SOLN
INTRAMUSCULAR | Status: DC | PRN
  Administered 2014-10-18: 24 mL

## 2014-10-18 MED ORDER — PRENATAL MULTIVITAMIN CH
1.0000 | ORAL_TABLET | Freq: Every day | ORAL | Status: DC
  Administered 2014-10-19 – 2014-10-20 (×2): 1 via ORAL
  Filled 2014-10-18 (×2): qty 1

## 2014-10-18 MED ORDER — KETOROLAC TROMETHAMINE 30 MG/ML IJ SOLN
INTRAMUSCULAR | Status: AC
  Filled 2014-10-18: qty 1

## 2014-10-18 MED ORDER — ONDANSETRON HCL 4 MG/2ML IJ SOLN
INTRAMUSCULAR | Status: AC
  Filled 2014-10-18: qty 2

## 2014-10-18 MED ORDER — OXYCODONE-ACETAMINOPHEN 5-325 MG PO TABS: 2.0000 | ORAL_TABLET | ORAL | Status: DC | PRN

## 2014-10-18 MED ORDER — IBUPROFEN 600 MG PO TABS
600.0000 mg | ORAL_TABLET | Freq: Four times a day (QID) | ORAL | Status: DC | PRN
Start: 2014-10-18 — End: 2014-10-18

## 2014-10-18 MED ORDER — DIPHENHYDRAMINE HCL 25 MG PO CAPS: 25.0000 mg | ORAL_CAPSULE | Freq: Four times a day (QID) | ORAL | Status: DC | PRN

## 2014-10-18 MED ORDER — 0.9 % SODIUM CHLORIDE (POUR BTL) OPTIME
TOPICAL | Status: DC | PRN
  Administered 2014-10-18: 1000 mL

## 2014-10-18 MED ORDER — NALBUPHINE HCL 10 MG/ML IJ SOLN: 5.0000 mg | INTRAMUSCULAR | Status: DC | PRN

## 2014-10-18 MED ORDER — SODIUM CHLORIDE 0.9 % IJ SOLN
3.0000 mL | INTRAMUSCULAR | Status: DC | PRN
Start: 2014-10-18 — End: 2014-10-20

## 2014-10-18 MED ORDER — BUPIVACAINE HCL (PF) 0.5 % IJ SOLN
INTRAMUSCULAR | Status: AC
  Filled 2014-10-18: qty 30

## 2014-10-18 MED ORDER — PHENYLEPHRINE 8 MG IN D5W 100 ML (0.08MG/ML) PREMIX OPTIME
INJECTION | INTRAVENOUS | Status: AC
  Filled 2014-10-18: qty 100

## 2014-10-18 MED ORDER — LACTATED RINGERS IV SOLN: INTRAVENOUS | Status: DC

## 2014-10-18 MED ORDER — LACTATED RINGERS IV SOLN
INTRAVENOUS | Status: DC
  Administered 2014-10-19: 02:00:00 via INTRAVENOUS

## 2014-10-18 MED ORDER — PROMETHAZINE HCL 25 MG/ML IJ SOLN: 12.5000 mg | Freq: Once | INTRAMUSCULAR | Status: DC | PRN

## 2014-10-18 MED ORDER — LIDOCAINE HCL 1 % IJ SOLN
INTRAMUSCULAR | Status: AC
  Filled 2014-10-18: qty 20

## 2014-10-18 MED ORDER — HYDROMORPHONE HCL 1 MG/ML IJ SOLN: 0.2500 mg | INTRAMUSCULAR | Status: DC | PRN

## 2014-10-18 MED ORDER — OXYTOCIN 10 UNIT/ML IJ SOLN
INTRAMUSCULAR | Status: AC
  Filled 2014-10-18: qty 4

## 2014-10-18 MED ORDER — FENTANYL CITRATE (PF) 100 MCG/2ML IJ SOLN
INTRAMUSCULAR | Status: DC | PRN
  Administered 2014-10-18: 100 ug via EPIDURAL

## 2014-10-18 MED ORDER — SENNOSIDES-DOCUSATE SODIUM 8.6-50 MG PO TABS
2.0000 | ORAL_TABLET | ORAL | Status: DC
  Administered 2014-10-20: 2 via ORAL
  Filled 2014-10-18: qty 2

## 2014-10-18 MED ORDER — MORPHINE SULFATE (PF) 0.5 MG/ML IJ SOLN
INTRAMUSCULAR | Status: AC
  Filled 2014-10-18: qty 10

## 2014-10-18 MED ORDER — ACETAMINOPHEN 325 MG PO TABS
650.0000 mg | ORAL_TABLET | ORAL | Status: DC | PRN
  Administered 2014-10-19 – 2014-10-20 (×2): 650 mg via ORAL
  Filled 2014-10-18 (×2): qty 2

## 2014-10-18 MED ORDER — SCOPOLAMINE 1 MG/3DAYS TD PT72
MEDICATED_PATCH | TRANSDERMAL | Status: AC
  Administered 2014-10-18: 1.5 mg via TRANSDERMAL
  Filled 2014-10-18: qty 1

## 2014-10-18 MED ORDER — ONDANSETRON HCL 4 MG/2ML IJ SOLN
INTRAMUSCULAR | Status: DC | PRN
  Administered 2014-10-18: 4 mg via INTRAVENOUS

## 2014-10-18 MED ORDER — FENTANYL CITRATE (PF) 100 MCG/2ML IJ SOLN
INTRAMUSCULAR | Status: AC
  Filled 2014-10-18: qty 4

## 2014-10-18 MED ORDER — SCOPOLAMINE 1 MG/3DAYS TD PT72
MEDICATED_PATCH | TRANSDERMAL | Status: AC
  Filled 2014-10-18: qty 1

## 2014-10-18 MED ORDER — KETOROLAC TROMETHAMINE 30 MG/ML IJ SOLN
30.0000 mg | Freq: Four times a day (QID) | INTRAMUSCULAR | Status: AC | PRN
  Administered 2014-10-18: 30 mg via INTRAMUSCULAR

## 2014-10-18 SURGICAL SUPPLY — 37 items
BARRIER ADHS 3X4 INTERCEED (GAUZE/BANDAGES/DRESSINGS) IMPLANT
BRR ADH 4X3 ABS CNTRL BYND (GAUZE/BANDAGES/DRESSINGS)
CLAMP CORD UMBIL (MISCELLANEOUS) ×2 IMPLANT
CLIP FILSHIE TUBAL LIGA STRL (Clip) ×2 IMPLANT
CLOTH BEACON ORANGE TIMEOUT ST (SAFETY) ×3 IMPLANT
DRAPE SHEET LG 3/4 BI-LAMINATE (DRAPES) ×2 IMPLANT
DRSG OPSITE POSTOP 4X10 (GAUZE/BANDAGES/DRESSINGS) ×3 IMPLANT
DURAPREP 26ML APPLICATOR (WOUND CARE) ×3 IMPLANT
ELECT REM PT RETURN 9FT ADLT (ELECTROSURGICAL) ×3
ELECTRODE REM PT RTRN 9FT ADLT (ELECTROSURGICAL) ×1 IMPLANT
EXTRACTOR VACUUM KIWI (MISCELLANEOUS) IMPLANT
GLOVE BIO SURGEON STRL SZ 6.5 (GLOVE) ×2 IMPLANT
GLOVE BIO SURGEONS STRL SZ 6.5 (GLOVE) ×1
GLOVE BIOGEL PI IND STRL 7.0 (GLOVE) ×1 IMPLANT
GLOVE BIOGEL PI INDICATOR 7.0 (GLOVE) ×2
GOWN STRL REUS W/TWL LRG LVL3 (GOWN DISPOSABLE) ×6 IMPLANT
KIT ABG SYR 3ML LUER SLIP (SYRINGE) IMPLANT
NDL HYPO 25X5/8 SAFETYGLIDE (NEEDLE) IMPLANT
NEEDLE HYPO 22GX1.5 SAFETY (NEEDLE) ×2 IMPLANT
NEEDLE HYPO 25X5/8 SAFETYGLIDE (NEEDLE) IMPLANT
NS IRRIG 1000ML POUR BTL (IV SOLUTION) ×3 IMPLANT
PACK C SECTION WH (CUSTOM PROCEDURE TRAY) ×3 IMPLANT
PAD ABD 7.5X8 STRL (GAUZE/BANDAGES/DRESSINGS) ×2 IMPLANT
PAD OB MATERNITY 4.3X12.25 (PERSONAL CARE ITEMS) ×3 IMPLANT
PENCIL SMOKE EVAC W/HOLSTER (ELECTROSURGICAL) ×3 IMPLANT
RTRCTR C-SECT PINK 25CM LRG (MISCELLANEOUS) ×2 IMPLANT
SPONGE GAUZE 4X4 12PLY STER LF (GAUZE/BANDAGES/DRESSINGS) ×2 IMPLANT
SUT PLAIN 2 0 XLH (SUTURE) ×2 IMPLANT
SUT VIC AB 0 CT1 36 (SUTURE) ×18 IMPLANT
SUT VIC AB 2-0 CT1 27 (SUTURE) ×3
SUT VIC AB 2-0 CT1 TAPERPNT 27 (SUTURE) ×1 IMPLANT
SUT VIC AB 4-0 KS 27 (SUTURE) ×4 IMPLANT
SUT VIC AB 4-0 PS2 27 (SUTURE) ×3 IMPLANT
SYR CONTROL 10ML LL (SYRINGE) ×2 IMPLANT
TAPE CLOTH SURG 4X10 WHT LF (GAUZE/BANDAGES/DRESSINGS) ×2 IMPLANT
TOWEL OR 17X24 6PK STRL BLUE (TOWEL DISPOSABLE) ×3 IMPLANT
TRAY FOLEY CATH SILVER 14FR (SET/KITS/TRAYS/PACK) ×2 IMPLANT

## 2014-10-18 NOTE — Progress Notes (Signed)
Subjective:  Kayla Scott is a 39 y.o. [id number] at [redacted]w[redacted]d being seen today for ongoing prenatal care.  Patient reports no complaints.  Contractions: Irregular.  Vag. Bleeding: None. Movement: Present. Denies leaking of fluid.   The following portions of the patient's history were reviewed and updated as appropriate: allergies, current medications, past family history, past medical history, past social history, past surgical history and problem list.   Objective:   Filed Vitals:   10/17/14 0947  BP: 105/74  Pulse: 106  Weight: 262 lb 11.2 oz (119.16 kg)    Fetal Status: Fetal Heart Rate (bpm): NST   Movement: Present     General:  Alert, oriented and cooperative. Patient is in no acute distress.  Skin: Skin is warm and dry. No rash noted.   Cardiovascular: Normal heart rate noted  Respiratory: Normal respiratory effort, no problems with respiration noted  Abdomen: Soft, gravid, appropriate for gestational age. Pain/Pressure: Present     Pelvic: Vag. Bleeding: None     Cervical exam deferred        Extremities: Normal range of motion.  Edema: Trace  Mental Status: Normal mood and affect. Normal behavior. Normal judgment and thought content.   Urinalysis: Urine Protein: 1+ Urine Glucose: Negative  Assessment and Plan:  Pregnancy: [id number] at [redacted]w[redacted]d  1. Gestational diabetes mellitus, antepartum A2/B CBG well controlled - Fetal nonstress test; Future - Fetal nonstress test -Delivery at 39 weeks -ASA stopped  Term labor symptoms and general obstetric precautions including but not limited to vaginal bleeding, contractions, leaking of fluid and fetal movement were reviewed in detail with the patient. Please refer to After Visit Summary for other counseling recommendations.  Return in about 6 weeks (around 11/28/2014).   Lesly Dukes, MD

## 2014-10-18 NOTE — Anesthesia Preprocedure Evaluation (Signed)
Anesthesia Evaluation  Patient identified by MRN, date of birth, ID band Patient awake    Reviewed: Allergy & Precautions, H&P , NPO status , Patient's Chart, lab work & pertinent test results  Airway Mallampati: III  TM Distance: >3 FB Neck ROM: full    Dental no notable dental hx.    Pulmonary neg pulmonary ROS,    Pulmonary exam normal        Cardiovascular negative cardio ROS Normal cardiovascular exam     Neuro/Psych negative neurological ROS  negative psych ROS   GI/Hepatic negative GI ROS, Neg liver ROS,   Endo/Other  diabetesMorbid obesity  Renal/GU negative Renal ROS     Musculoskeletal   Abdominal (+) + obese,   Peds  Hematology negative hematology ROS (+)   Anesthesia Other Findings   Reproductive/Obstetrics (+) Pregnancy                             Anesthesia Physical Anesthesia Plan  ASA: III  Anesthesia Plan: Combined Spinal and Epidural   Post-op Pain Management:    Induction:   Airway Management Planned:   Additional Equipment:   Intra-op Plan:   Post-operative Plan:   Informed Consent: I have reviewed the patients History and Physical, chart, labs and discussed the procedure including the risks, benefits and alternatives for the proposed anesthesia with the patient or authorized representative who has indicated his/her understanding and acceptance.     Plan Discussed with: CRNA and Surgeon  Anesthesia Plan Comments:         Anesthesia Quick Evaluation

## 2014-10-18 NOTE — Transfer of Care (Signed)
Immediate Anesthesia Transfer of Care Note  Patient: Kayla Scott  Procedure(s) Performed: Procedure(s): REPEAT CESAREAN SECTION WITH BILATERAL TUBAL LIGATION (Bilateral)  Patient Location: PACU  Anesthesia Type:Spinal  Level of Consciousness: awake, alert , oriented and patient cooperative  Airway & Oxygen Therapy: Patient Spontanous Breathing  Post-op Assessment: Report given to RN and Post -op Vital signs reviewed and stable  Post vital signs: Reviewed and stable  Last Vitals:  Filed Vitals:   10/18/14 1321  BP: 121/79  Pulse: 105  Temp: 36.8 C  Resp: 18    Complications: No apparent anesthesia complications

## 2014-10-18 NOTE — Op Note (Signed)
10/18/2014  4:45 PM  PATIENT:  Kayla Scott  39 y.o. female  PRE-OPERATIVE DIAGNOSIS:  3rd REPEAT c-section and undesired fertility  POST-OPERATIVE DIAGNOSIS:  3rd REPEAT c-section and undesired fertility  PROCEDURE:  Procedure(s):REPEAT CESAREAN SECTION WITH BILATERAL TUBAL LIGATION (Bilateral)  SURGEON:  Surgeon(s) and Role:    * Adam Phenix, MD - Primary    * Federico Flake, MD - Assisting  EBL:  Total I/O In: 1000 [I.V.:1000] Out: 750 [Urine:250; Blood:500]  BLOOD ADMINISTERED:none  DRAINS: none   LOCAL MEDICATIONS USED:  MARCAINE     SPECIMEN:  Source of Specimen:  Placenta  DISPOSITION OF SPECIMEN:  L&D  INDICATIONS: Kayla Scott is a 39 y.o. [id number] at [redacted]w[redacted]d here for cesarean section and bilateral tubal sterilization secondary to the indications listed under preoperative diagnoses; please see preoperative note for further details.  The risks of surgery were discussed with the patient including but were not limited to: bleeding which may require transfusion or reoperation; infection which may require antibiotics; injury to bowel, bladder, ureters or other surrounding organs; injury to the fetus; need for additional procedures including hysterectomy in the event of a life-threatening hemorrhage; placental abnormalities wth subsequent pregnancies, incisional problems, thromboembolic phenomenon and other postoperative/anesthesia complications.  Patient also desires permanent sterilization.  Other reversible forms of contraception were discussed with patient; she declines all other modalities. Risks of procedure discussed with patient including but not limited to: risk of regret, permanence of method, bleeding, infection, injury to surrounding organs and need for additional procedures.  Failure risk of 1-2% with increased risk of ectopic gestation if pregnancy occurs was also discussed with patient.  The patient concurred with the proposed plan,  giving informed written consent for the procedures.    FINDINGS:  Viable female infant in cephalic presentation.  Apgars 8 and 9.  Clear amniotic fluid.  Intact placenta, three vessel cord.  Normal uterus, fallopian tubes and ovaries bilaterally. Fallopian tubes sterilized with Filshie clips bilaterally.  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   She was then taken to the operating room where spinal anesthesia was administered and was found to be adequate. She was then placed in a dorsal supine position with a leftward tilt, and prepped and draped in a sterile manner.  A foley catheter was placed into her bladder and attached to constant gravity.  After an adequate timeout was performed, a Pfannenstiel skin incision was made with scalpel and carried through to the underlying layer of fascia. The fascia was incised in the midline, and this incision was extended bilaterally using the Mayo scissors.  Kocher clamps were applied to the superior aspect of the fascial incision and the underlying rectus muscles were dissected off bluntly. A similar process was carried out on the inferior aspect of the fascial incision. The rectus muscles were separated in the midline bluntly and the peritoneum was entered bluntly. Attention was turned to the lower uterine segment where a low transverse hysterotomy was made with a scalpel and extended bilaterally bluntly.  The infant was successfully delivered, the cord was clamped and cut and the infant was handed over to awaiting neonatology team. Uterine massage was then administered, and the placenta delivered intact with a three-vessel cord. The uterus was then cleared of clot and debris.  The hysterotomy was closed with 0 Vicryl in a running locked fashion. No imbricating  layer was placed  2/2 to a thin lower uterine segment. One Figure-of-eight serosal stitches were placed to help with hemostasis on the Left hysterotomy corner.  Attention was then turned to the fallopian tubes, and Filshie clips were placed about 3 cm from the cornua, with care given to incorporate the underlying mesosalpinx on both sides, allowing for bilateral tubal sterilization. The pelvis was cleared of all clot and debris. Hemostasis was confirmed on all surfaces.  The peritoneum and the muscles were reapproximated using 0 Vicryl interrupted stitches. The fascia was then closed using 0 Vicryl in a running fashion.  The subcutaneous layer was irrigated, then reapproximated with 2-0 plain gut interrupted stitches, and 30 ml of 0.5% Marcaine was injected subcutaneously around the incision.  The skin was closed with a 4-0 Vicryl subcuticular stitch. The patient tolerated the procedure well. Sponge, lap, instrument and needle counts were correct x 2.  She was taken to the recovery room in stable condition.   Federico Flake, MD  Family Medicine, OB Fellow [facility]

## 2014-10-18 NOTE — H&P (Signed)
Obstetric Preoperative History and Physical  Charlena Dominguez-Urieta is a 39 y.o. [id number] with IUP at [redacted]w[redacted]d presenting for presenting for scheduled cesarean section.  No acute concerns.   Prenatal Course Source of Care: [facility]  with onset of care at 17 weeks Pregnancy complications or risks: Patient Active Problem List   Diagnosis Date Noted  . Fetal abnormality affecting management of mother, antepartum condition or complication   . Gestational diabetes mellitus, antepartum A2/B 05/30/2014  . History of pre-term labor 05/23/2014  . Supervision of high risk pregnancy, antepartum 05/23/2014  . Previous cesarean section complicating pregnancy, antepartum condition or complication 05/23/2014  . AMA (advanced maternal age) multigravida 35+ 05/23/2014   She plans to breastfeed She desires bilateral tubal ligation for postpartum contraception.   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 and a single umbilical artery > rescan 6/3 remains > follow-up in two weeks (23 wks) Rubella: Immune (05/03 0000)  GTT Early:         157      Third trimester: 101, 147, 167, 131 RPR: Nonreactive (05/03 0000) neg  Flu vaccine 3/16, 09/05/14 HBsAg: Negative (05/03 0000)   TDaP vaccine           07/19/2014                                  HIV: Non-reactive (05/03 0000)   GBS       negative                                 GBS: neg  Contraception Desires BTL Pap: neg 05/18/14  Baby Food Breast/Bottle LABS NOT IN CHART - WILL CONTACT HEALTH DEPT 05/31/14  Circumcision    Pediatrician  [facility]; desires another place with Spanish provider   Support Person  Husband Jeffie Pollock)     Prenatal labs and studies: ABO, Rh: --/--/O POS (10/03 0915) Antibody: NEG (10/03 0915) Rubella: Immune (05/03  0000) RPR: Non Reactive (10/03 0915)  HBsAg: Negative (05/03 0000)  HIV: NONREACTIVE (07/05 1324)  GBS:  1 hr Glucola  Abnormal, failed 3 hr--> A2GDM Genetic screening abnormal with increase Trisomy 21 risk Anatomy US abnormal with 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 UTI (lower urinary tract infection)   . Diabetes mellitus without complication 2016  . Gestational diabetes     Past Surgical History  Procedure Laterality Date  . Cesarean section      OB History  Gravida Para Term Preterm AB SAB TAB Ectopic Multiple Living  8 4 3 1 3 3    4     # Outcome Date GA Lbr Len/2nd Weight Sex Delivery Anes PTL Lv  8 Current  7 SAB 01/14/13 [redacted]w[redacted]d         6 SAB 01/15/12 [redacted]w[redacted]d         5 SAB 01/14/09 [redacted]w[redacted]d            Comments: System Generated. Please review and update pregnancy details.  4 Preterm 07/24/01 [redacted]w[redacted]d   M Constance Goltz Y  3 Term 11/09/98 [redacted]w[redacted]d   F CS-LTranv Spinal N Y  2 Term 10/05/94 [redacted]w[redacted]d   Kateri Plummer Y  1 Term 01/12/93 [redacted]w[redacted]d   Kateri Plummer Y      Social History   Social History  . Marital Status: Married    Spouse Name: N/A  . Number of Children: N/A  . Years of Education: N/A   Social History Main Topics  . Smoking status: Never Smoker   . Smokeless tobacco: Never Used  . Alcohol Use: No  . Drug Use: No  . Sexual Activity: Yes    Birth Control/ Protection: None   Other Topics Concern  . Not on file   Social History Narrative    Family History  Problem Relation Age of Onset  .  Diabetes Mother   . Cancer Paternal Grandmother     uterine    Prescriptions prior to admission  Medication Sig Dispense Refill Last Dose  . aspirin EC 81 MG tablet Take 1 tablet (81 mg total) by mouth daily. (Patient not taking: Reported on 10/17/2014) 30 tablet 3 Not Taking  . glyBURIDE (DIABETA) 2.5 MG tablet Take 0.5 tablets (1.25 mg total) by mouth 2 (two) times daily with a meal. 60 tablet 3 Taking  . Prenatal Vit-Fe Fumarate-FA (PRENATAL COMPLETE) 14-0.4 MG TABS Take 1 tablet by mouth daily. 60 each 3 Taking    Allergies  Allergen Reactions  . Metformin And Related Shortness Of Breath    Felt like throat was swelling    Review of Systems: Negative except for what is mentioned in HPI.  Physical Exam: BP 121/79 mmHg  Pulse 105  Temp(Src) 98.2 F (36.8 C) (Oral)  Resp 18  SpO2 99%  LMP 12/31/2013 FHR by Doppler: 150 bpm CONSTITUTIONAL: Well-developed, well-nourished female in no acute distress.  HENT:  Normocephalic, atraumatic, External right and left ear normal. Oropharynx is clear and moist EYES: Conjunctivae and EOM are normal. Pupils are equal, round, and reactive to light. No scleral icterus.  NECK: Normal range of motion, supple, no masses SKIN: Skin is warm and dry. No rash noted. Not diaphoretic. No erythema. No pallor. NEUROLGIC: Alert and oriented to person, place, and time. Normal reflexes, muscle tone coordination. No cranial nerve deficit noted. PSYCHIATRIC: Normal mood and affect. Normal behavior. Normal judgment and thought content. CARDIOVASCULAR: Normal heart rate noted, regular rhythm RESPIRATORY: Effort and breath sounds normal, no problems with respiration noted ABDOMEN: Soft, nontender, nondistended, gravid. Well-healed Pfannenstiel incision. PELVIC: Deferred MUSCULOSKELETAL: Normal range of motion. No edema and no tenderness. 2+ distal pulses.   Pertinent Labs/Studies:   Results for orders placed or performed during the hospital encounter of  10/18/14 (from the past 72 hour(s))  Glucose, capillary     Status: None   Collection Time: 10/18/14  2:27 PM  Result Value Ref Range   Glucose-Capillary 65 65 - 99 mg/dL   Assessment and Plan :Via Dominguez-Urieta is a 39 y.o. [id number] at [redacted]w[redacted]d being admitted being admitted for scheduled cesarean section. The risks of cesarean section discussed with the patient included but were not limited to: bleeding which may require transfusion  or reoperation; infection which may require antibiotics; injury to bowel, bladder, ureters or other surrounding organs; injury to the fetus; need for additional procedures including hysterectomy in the event of a life-threatening hemorrhage; placental abnormalities wth subsequent pregnancies, incisional problems, thromboembolic phenomenon and other postoperative/anesthesia complications. The patient concurred with the proposed plan, giving informed written consent for the procedure. Patient has been NPO since last night she will remain NPO for procedure. Anesthesia and OR aware. Preoperative prophylactic antibiotics and SCDs ordered on call to the OR. To OR when ready.    Federico Flake, MD  Family Medicine, OB Fellow Faculty Practice, [facility]

## 2014-10-18 NOTE — Progress Notes (Signed)
I was present during C- Section with Dr Alvester Morin, Dr Debroah Loop, Dr Arby Barrette by Orlan Leavens Spanish Interpreter.

## 2014-10-18 NOTE — Anesthesia Procedure Notes (Signed)
Spinal Patient location during procedure: OR Start time: 10/18/2014 3:15 PM End time: 10/18/2014 3:25 PM Staffing Anesthesiologist: Leilani Able Performed by: anesthesiologist  Preanesthetic Checklist Completed: patient identified, surgical consent, pre-op evaluation, timeout performed, IV checked, risks and benefits discussed and monitors and equipment checked Spinal Block Patient position: sitting Prep: site prepped and draped and DuraPrep Patient monitoring: cardiac monitor, continuous pulse ox, blood pressure and heart rate Approach: midline Location: L3-4 Injection technique: catheter Needle Needle type: Tuohy and Sprotte  Needle gauge: 24 G Needle length: 12.7 cm Needle insertion depth: 8 cm Catheter type: closed end flexible Catheter size: 19 g Catheter at skin depth: 13 cm Assessment Sensory level: T4

## 2014-10-18 NOTE — Brief Op Note (Signed)
10/18/2014  4:45 PM  PATIENT:  Kayla Scott  39 y.o. female  PRE-OPERATIVE DIAGNOSIS:  3rd REPEAT c-section and undesired fertility  POST-OPERATIVE DIAGNOSIS:  rd REPEAT c-section and undesired fertility  PROCEDURE:  Procedure(s): REPEAT CESAREAN SECTION WITH BILATERAL TUBAL LIGATION (Bilateral)  SURGEON:  Surgeon(s) and Role:    * Adam Phenix, MD - Primary    * Federico Flake, MD - Assisting  EBL:  Total I/O In: 1000 [I.V.:1000] Out: 750 [Urine:250; Blood:500]  BLOOD ADMINISTERED:none  DRAINS: none   LOCAL MEDICATIONS USED:  MARCAINE     SPECIMEN:  Source of Specimen:  Placenta  DISPOSITION OF SPECIMEN:  L&D  COUNTS:  YES  TOURNIQUET:  * No tourniquets in log *  DICTATION: .Note written in EPIC  PLAN OF CARE: Admit to inpatient   PATIENT DISPOSITION:  PACU - hemodynamically stable.   Delay start of Pharmacological VTE agent (>24hrs) due to surgical blood loss or risk of bleeding: yes

## 2014-10-18 NOTE — Anesthesia Postprocedure Evaluation (Signed)
Anesthesia Post Note  Patient: Kayla Scott  Procedure(s) Performed: Procedure(s) (LRB): REPEAT CESAREAN SECTION WITH BILATERAL TUBAL LIGATION (Bilateral)  Anesthesia type: Spinal  Patient location: PACU  Post pain: Pain level controlled  Post assessment: Post-op Vital signs reviewed  Last Vitals:  Filed Vitals:   10/18/14 1730  BP: 115/85  Pulse: 82  Temp:   Resp: 11    Post vital signs: Reviewed  Level of consciousness: awake  Complications: No apparent anesthesia complications

## 2014-10-19 ENCOUNTER — Encounter (HOSPITAL_COMMUNITY): Payer: Self-pay | Admitting: Obstetrics & Gynecology

## 2014-10-19 LAB — CBC
HCT: 32.6 % — ABNORMAL LOW (ref 36.0–46.0)
HEMOGLOBIN: 10.9 g/dL — AB (ref 12.0–15.0)
MCH: 31.8 pg (ref 26.0–34.0)
MCHC: 33.4 g/dL (ref 30.0–36.0)
MCV: 95 fL (ref 78.0–100.0)
PLATELETS: 213 10*3/uL (ref 150–400)
RBC: 3.43 MIL/uL — ABNORMAL LOW (ref 3.87–5.11)
RDW: 14.5 % (ref 11.5–15.5)
WBC: 8.5 10*3/uL (ref 4.0–10.5)

## 2014-10-19 LAB — BIRTH TISSUE RECOVERY COLLECTION (PLACENTA DONATION)

## 2014-10-19 NOTE — Progress Notes (Signed)
Post Op Day 1  Subjective:  Kayla Scott is a 39 y.o. [id number] [redacted]w[redacted]d s/p rLTCS w/ BTL.  No acute events overnight.  Pt denies problems with ambulating, or po intake. Foley is in place w/ some decreased UOP noted. She denies nausea or vomiting.  Pain is well controlled.  She has not had flatus. She has not had bowel movement.  Lochia Minimal.  Plan for birth control is bilateral tubal ligation.  Method of Feeding: Breast  Objective: BP 95/50 mmHg  Pulse 68  Temp(Src) 98.2 F (36.8 C) (Oral)  Resp 18  SpO2 96%  LMP 12/31/2013  Breastfeeding? Unknown  Physical Exam:  General: alert, cooperative and no distress Lochia:normal flow Chest: CTAB Heart: RRR no m/r/g Abdomen: +BS, soft, obese, mild/moderately tender w/ palpation, fundus firm below umbilicus, surgical dressing clean w/o surrounding erythema DVT Evaluation: No evidence of DVT seen on physical exam. Extremities: mild edema   Recent Labs  10/17/14 0915 10/19/14 0550  HGB 12.2 10.9*  HCT 35.8* 32.6*    Assessment/Plan:  ASSESSMENT: Kayla Scott is a 39 y.o. [id number] [redacted]w[redacted]d pod #1 s/p rLTCS w/ BTL doing well.   1L LR has been hung 2/2 decreased UOP. Will monitor Will also monitor for SM/flatus  Breastfeeding and Lactation consult   LOS: 1 day   Mickie Hillier 10/19/2014, 6:13 AM   OB fellow attestation Post Partum Day 1 I have seen and examined this patient and agree with above documentation in the resident's note.   Kayla Scott is a 39 y.o. [id number] s/p RLTCS with BTL.  Pt denies problems with ambulating, voiding or po intake. Pain is well controlled.  Plan for birth control is bilateral tubal ligation.  Method of Feeding: breast  PE:  BP 109/54 mmHg  Pulse 75  Temp(Src) 98.3 F (36.8 C) (Oral)  Resp 18  SpO2 98%  LMP 12/31/2013  Breastfeeding? Unknown Gen: well appearing Heart: reg rate Lungs: normal WOB Fundus firm Ext: soft, no pain, no edema Incision: dressing  clean/dry.   Plan for discharge: POD#2 or #3 Routine pp care Remove pressure dressing today   Federico Flake, MD 9:28 AM

## 2014-10-19 NOTE — Lactation Note (Signed)
This note was copied from the chart of Kayla Scott. Lactation Consultation Note Experienced BF mom to 4 other children , the youngest is 38 yrs old. BF over a yr each. Mom holding baby STS on chest when entered room. Baby had BF. Mom states she doesn't feel like she has milk yet. Hand expression done, mom in 1/2 laid back position, encouraged when doing hand expression to sit up right. Colostrum glisten to end. Moms husband interprets for her. RN speaks spanish as well. Mom requested hand pump for stimulation, given and demonstrated.  Mom encouraged to do skin-to-skin. Referred to Baby and Me Book in Breastfeeding section Pg. 22-23 for position options and Proper latch demonstration. Educated about newborn behavior, I&O.WH/LC brochure given w/resources, support groups and LC services. Patient Name: Kayla Alesa Echevarria [id number] Date: 10/19/2014 Reason for consult: Initial assessment   Maternal Data Has patient been taught Hand Expression?: Yes Does the patient have breastfeeding experience prior to this delivery?: Yes  Feeding    LATCH Score/Interventions Intervention(s): Breast compression;Breast massage  Intervention(s): Hand expression;Skin to skin  Type of Nipple: Everted at rest and after stimulation  Comfort (Breast/Nipple): Soft / non-tender           Lactation Tools Discussed/Used Tools: Pump Breast pump type: Manual WIC Program: Yes Pump Review: Setup, frequency, and cleaning;Milk Storage Initiated by:: Peri Jefferson RN Date initiated:: 10/19/14   Consult Status Consult Status: Follow-up Date: 10/20/14 Follow-up type: In-patient    Alakai Macbride, Diamond Nickel 10/19/2014, 5:55 AM

## 2014-10-19 NOTE — Progress Notes (Signed)
Pressure dressing removed, honeycomb 80% saturated, so it was replaced.  New honeycomb clean, dry and intact.  Interpreter and language line used to inform patient of plan of care and explain dressing change and baby assessment.

## 2014-10-19 NOTE — Progress Notes (Signed)
Interpreter in to go over plan of care with patient.  All questions answered.  Patient denies pain at this time.

## 2014-10-20 MED ORDER — OXYCODONE-ACETAMINOPHEN 5-325 MG PO TABS: 1.0000 | ORAL_TABLET | Freq: Four times a day (QID) | ORAL | Status: DC | PRN

## 2014-10-20 MED ORDER — DOCUSATE SODIUM 100 MG PO CAPS: 100.0000 mg | ORAL_CAPSULE | Freq: Two times a day (BID) | ORAL | Status: DC

## 2014-10-20 NOTE — Lactation Note (Signed)
This note was copied from the chart of Kayla Nirvi Scott. Lactation Consultation Note; Dad translated for me. Mom reports that baby has been feeding a lot through the night,. She has just finished feeding for only about 5 min but is going to sleep now. Reports a little pain with latch. Reviewed wide open mouth and keeping the baby close to the breast throughout the feeding. Suggested unwrapping and undressing baby- she has baby dressed and heavy blanket around her. Mom doesn't want to awaken her now since she is finally going to sleep. No questions at present. To call prn  Patient Name: Kayla Scott [id number] Date: 10/20/2014 Reason for consult: Follow-up assessment   Maternal Data Formula Feeding for Exclusion: No Has patient been taught Hand Expression?: Yes Does the patient have breastfeeding experience prior to this delivery?: Yes  Feeding Feeding Type: Breast Fed Length of feed: 10 min  LATCH Score/Interventions                      Lactation Tools Discussed/Used     Consult Status Consult Status: Complete    Pamelia Hoit 10/20/2014, 10:03 AM

## 2014-10-20 NOTE — Addendum Note (Signed)
Addendum  created 10/20/14 0841 by Shanon Payor, CRNA   Modules edited: Notes Section   Notes Section:  File: [id number]

## 2014-10-20 NOTE — Anesthesia Postprocedure Evaluation (Signed)
  Anesthesia Post-op Note  Patient: Kayla Scott  Procedure(s) Performed: Procedure(s): REPEAT CESAREAN SECTION WITH BILATERAL TUBAL LIGATION (Bilateral)  Patient Location: Mother baby  Anesthesia Type:Spinal  Level of Consciousness: awake, alert  and oriented  Airway and Oxygen Therapy: Patient Spontanous Breathing  Post-op Pain: none  Post-op Assessment: Post-op Vital signs reviewed, Patient's Cardiovascular Status Stable, Respiratory Function Stable, No signs of Nausea or vomiting, Adequate PO intake, Pain level controlled, No headache and No backache LLE Motor Response: No movement due to regional block LLE Sensation: Increased RLE Motor Response: Purposeful movement RLE Sensation: Increased      Post-op Vital Signs: Reviewed and stable  Last Vitals:  Filed Vitals:   10/20/14 0535  BP: 109/64  Pulse: 75  Temp: 36.8 C  Resp: 18    Complications: No apparent anesthesia complications

## 2014-10-20 NOTE — Discharge Summary (Signed)
OB Discharge Summary     Patient Name: Kayla Scott DOB: January 25, 1975 MRN: [id number]  Date of admission: 10/18/2014 Delivering MD: Adam Phenix   Date of discharge: 10/20/2014  Admitting diagnosis: cpt [phone] - 3rd REPEAT c-section and undesired fertility Intrauterine pregnancy: [redacted]w[redacted]d     Secondary diagnosis:  Principal Problem:   S/P C-section Active Problems:   History of pre-term labor   Previous cesarean section complicating pregnancy, antepartum condition or complication   AMA (advanced maternal age) multigravida 35+   Gestational diabetes mellitus, antepartum A2/B   Fetal abnormality affecting management of mother, antepartum condition or complication     Discharge diagnosis: Term Pregnancy Delivered                                                                                                Post partum procedures:None  Augmentation: NA  Complications: None  Hospital course:  Sceduled C/S   39 y.o. yo [id number] at [redacted]w[redacted]d was admitted to the hospital 10/18/2014 for scheduled cesarean section with the following indication:Elective Repeat.  Membrane Rupture Time/Date: 3:59 PM ,10/18/2014   Patient delivered a Viable infant.10/18/2014  Details of operation can be found in separate operative note.  Pateint had an uncomplicated postpartum course.  She is ambulating, tolerating a regular diet, passing flatus, and urinating well. Patient is discharged home in stable condition on No discharge date for patient encounter.          Physical exam  Filed Vitals:   10/19/14 1000 10/19/14 1500 10/19/14 1903 10/20/14 0535  BP: 117/66 99/52 98/53  109/64  Pulse: 81 70 89 75  Temp: 98.8 F (37.1 C) 98.1 F (36.7 C) 98.7 F (37.1 C) 98.3 F (36.8 C)  TempSrc: Oral Oral Oral Oral  Resp: SpO2: 96% 98%     General: alert, cooperative and no distress Lochia: appropriate Uterine Fundus: firm Incision: Healing well with no significant drainage, No significant  erythema DVT Evaluation: No evidence of DVT seen on physical exam. Negative Homan's sign.  Labs: Lab Results  Component Value Date   WBC 8.5 10/19/2014   HGB 10.9* 10/19/2014   HCT 32.6* 10/19/2014   MCV 95.0 10/19/2014   PLT 213 10/19/2014   CMP Latest Ref Rng 10/17/2014  Glucose 65 - 99 mg/dL 469(G)  BUN 6 - 20 mg/dL 10  Creatinine 2.95 - 2.84 mg/dL 1.32  Sodium 440 - 102 mmol/L 136  Potassium 3.5 - 5.1 mmol/L 3.6  Chloride 101 - 111 mmol/L 109  CO2 22 - 32 mmol/L 23  Calcium 8.9 - 10.3 mg/dL 7.2(Z)  Total Protein 6.0 - 8.3 g/dL -  Total Bilirubin 0.3 - 1.2 mg/dL -  Alkaline Phos 39 - 366 U/L -  AST 0 - 37 U/L -  ALT 0 - 35 U/L -    Discharge instruction: per After Visit Summary and "Baby and Me Booklet".  Medications:  Current facility-administered medications:  .  acetaminophen (TYLENOL) tablet 650 mg, 650 mg, Oral, Q4H PRN, Federico Flake, MD, 650 mg at 10/19/14 1823 .  witch hazel-glycerin (TUCKS) pad 1  application, 1 application, Topical, PRN **AND** dibucaine (NUPERCAINAL) 1 % rectal ointment 1 application, 1 application, Rectal, PRN, Federico Flake, MD .  diphenhydrAMINE (BENADRYL) injection 12.5 mg, 12.5 mg, Intravenous, Q4H PRN **OR** diphenhydrAMINE (BENADRYL) capsule 25 mg, 25 mg, Oral, Q4H PRN, Leilani Able, MD .  diphenhydrAMINE (BENADRYL) capsule 25 mg, 25 mg, Oral, Q6H PRN, Federico Flake, MD .  ibuprofen (ADVIL,MOTRIN) tablet 600 mg, 600 mg, Oral, 4 times per day, Federico Flake, MD, 600 mg at 10/20/14 1191 .  lactated ringers infusion, , Intravenous, Continuous, Federico Flake, MD, Last Rate: 125 mL/hr at 10/19/14 1000 .  lanolin ointment 1 application, 1 application, Topical, PRN, Federico Flake, MD .  menthol-cetylpyridinium (CEPACOL) lozenge 3 mg, 1 lozenge, Oral, Q2H PRN, Federico Flake, MD .  nalbuphine (NUBAIN) injection 5 mg, 5 mg, Intravenous, Q4H PRN **OR** nalbuphine (NUBAIN) injection 5 mg, 5  mg, Subcutaneous, Q4H PRN, Leilani Able, MD .  nalbuphine (NUBAIN) injection 5 mg, 5 mg, Intravenous, Once PRN **OR** nalbuphine (NUBAIN) injection 5 mg, 5 mg, Subcutaneous, Once PRN, Leilani Able, MD .  naloxone (NARCAN) 2 mg in dextrose 5 % 250 mL infusion, 1-4 mcg/kg/hr, Intravenous, Continuous PRN, Leilani Able, MD .  naloxone [facility]) injection 0.4 mg, 0.4 mg, Intravenous, PRN **AND** sodium chloride 0.9 % injection 3 mL, 3 mL, Intravenous, PRN, Leilani Able, MD .  ondansetron (ZOFRAN) injection 4 mg, 4 mg, Intravenous, Q8H PRN, Leilani Able, MD .  oxyCODONE-acetaminophen (PERCOCET/ROXICET) 5-325 MG per tablet 1 tablet, 1 tablet, Oral, Q4H PRN, Federico Flake, MD .  oxyCODONE-acetaminophen (PERCOCET/ROXICET) 5-325 MG per tablet 2 tablet, 2 tablet, Oral, Q4H PRN, Federico Flake, MD .  prenatal multivitamin tablet 1 tablet, 1 tablet, Oral, Q1200, Federico Flake, MD, 1 tablet at 10/19/14 1125 .  senna-docusate (Senokot-S) tablet 2 tablet, 2 tablet, Oral, Q24H, Federico Flake, MD, 2 tablet at 10/20/14 0000 .  simethicone (MYLICON) chewable tablet 80 mg, 80 mg, Oral, TID PC, Federico Flake, MD, 80 mg at 10/19/14 1823 .  simethicone (MYLICON) chewable tablet 80 mg, 80 mg, Oral, Q24H, Federico Flake, MD, 80 mg at 10/20/14 0000 .  simethicone (MYLICON) chewable tablet 80 mg, 80 mg, Oral, PRN, Federico Flake, MD .  zolpidem [facility]) tablet 5 mg, 5 mg, Oral, QHS PRN, Federico Flake, MD  Diet: routine diet  Activity: Advance as tolerated. Pelvic rest for 6 weeks.   Outpatient follow up:6 weeks  Postpartum contraception: Tubal Ligation  Newborn Data: Live born female  Birth Weight: 7 lb (3175 g) APGAR: 8, 9  Baby Feeding: Breast Disposition:home with mother  10/20/2014 Federico Flake, MD

## 2014-10-27 ENCOUNTER — Encounter (HOSPITAL_COMMUNITY): Payer: Self-pay | Admitting: *Deleted

## 2014-10-28 ENCOUNTER — Telehealth: Payer: Self-pay | Admitting: *Deleted

## 2014-10-28 NOTE — Telephone Encounter (Signed)
Received message left on nurse line 10/28/14 at 1009.  Armanda HeritageShantia - Home Visit Nurse calling to advise about patient after postpartum home visit.  States patient delivered via c/s on 10/18/14.  States patient told her she has some burning with urination.  Her temperature was 98.1.  States incision looked good but was a little moist.  States she spoke with patient about keeping her incision dry.  States she just wanted us to be aware.  Patient is already scheduled for postpartum appointment.

## 2014-11-05 ENCOUNTER — Encounter (HOSPITAL_COMMUNITY): Payer: Self-pay

## 2014-11-05 ENCOUNTER — Inpatient Hospital Stay (HOSPITAL_COMMUNITY)
Admission: AD | Admit: 2014-11-05 | Discharge: 2014-11-05 | Disposition: A | Discharge status: Home / Self Care | Payer: Self-pay | Source: Ambulatory Visit | Attending: Family Medicine | Admitting: Family Medicine

## 2014-11-05 DIAGNOSIS — O9089 Other complications of the puerperium, not elsewhere classified: Secondary | ICD-10-CM | POA: Insufficient documentation

## 2014-11-05 DIAGNOSIS — B372 Candidiasis of skin and nail: Secondary | ICD-10-CM | POA: Insufficient documentation

## 2014-11-05 DIAGNOSIS — R238 Other skin changes: Secondary | ICD-10-CM

## 2014-11-05 DIAGNOSIS — R21 Rash and other nonspecific skin eruption: Secondary | ICD-10-CM | POA: Insufficient documentation

## 2014-11-05 LAB — URINALYSIS, ROUTINE W REFLEX MICROSCOPIC
BILIRUBIN URINE: NEGATIVE
GLUCOSE, UA: NEGATIVE mg/dL
KETONES UR: NEGATIVE mg/dL
LEUKOCYTES UA: NEGATIVE
Nitrite: NEGATIVE
Protein, ur: NEGATIVE mg/dL
Specific Gravity, Urine: <1.005 — ABNORMAL LOW (ref 1.005–1.030)
Urobilinogen, UA: 0.2 mg/dL (ref 0.0–1.0)
pH: 5.5 (ref 5.0–8.0)

## 2014-11-05 LAB — CBC
HCT: 37.5 % (ref 36.0–46.0)
Hemoglobin: 13 g/dL (ref 12.0–15.0)
MCH: 33.2 pg (ref 26.0–34.0)
MCHC: 34.7 g/dL (ref 30.0–36.0)
MCV: 95.7 fL (ref 78.0–100.0)
PLATELETS: 412 10*3/uL — AB (ref 150–400)
RBC: 3.92 MIL/uL (ref 3.87–5.11)
RDW: 13.7 % (ref 11.5–15.5)
WBC: 8.3 10*3/uL (ref 4.0–10.5)

## 2014-11-05 LAB — URINE MICROSCOPIC-ADD ON

## 2014-11-05 MED ORDER — NYSTATIN 100000 UNIT/GM EX CREA: TOPICAL_CREAM | CUTANEOUS | Status: DC

## 2014-11-05 NOTE — MAU Provider Note (Signed)
History     CSN: [id number]  Arrival date and time: 11/05/14 1312   First Provider Initiated Contact with Patient 11/05/14 1347       Chief Complaint  Patient presents with  . Post-op Problem  . Rash   HPI Kayla Scott is a 39 y.o. female who presents 18 days s/p cesarean for wound problems.  States incision has been warm, wet, & painful. Has been putting neosporin ointment on incision. Denies fever or drainage.  Also reports rash on inner thighs & groin that is burning & itching.  States period started today.   Spanish speaking patient - interpreter at bedside  OB History    Gravida Para Term Preterm AB TAB SAB Ectopic Multiple Living   0 5      Past Medical History  Diagnosis Date  . UTI (lower urinary tract infection)   . Diabetes mellitus without complication (HCC) 2016  . Gestational diabetes     Past Surgical History  Procedure Laterality Date  . Cesarean section    . Cesarean section with bilateral tubal ligation Bilateral 10/18/2014    Procedure: REPEAT CESAREAN SECTION WITH BILATERAL TUBAL LIGATION;  Surgeon: Adam Phenix, MD;  Location: [facility];  Service: Obstetrics;  Laterality: Bilateral;    Family History  Problem Relation Age of Onset  . Diabetes Mother   . Cancer Paternal Grandmother     uterine    Social History  Substance Use Topics  . Smoking status: Never Smoker   . Smokeless tobacco: Never Used  . Alcohol Use: No    Allergies:  Allergies  Allergen Reactions  . Metformin And Related Shortness Of Breath    Felt like throat was swelling    Prescriptions prior to admission  Medication Sig Dispense Refill Last Dose  . docusate sodium (COLACE) 100 MG capsule Take 1 capsule (100 mg total) by mouth 2 (two) times daily. 30 capsule 2   . oxyCODONE-acetaminophen (PERCOCET/ROXICET) 5-325 MG tablet Take 1 tablet by mouth every 6 (six) hours as needed for severe pain (for pain scale 4-7). 30 tablet 0   . Prenatal  Vit-Fe Fumarate-FA (PRENATAL COMPLETE) 14-0.4 MG TABS Take 1 tablet by mouth daily. 60 each 3 10/18/2014 at Unknown time    Review of Systems  Constitutional: Negative for fever and chills.  Gastrointestinal: Negative.   Genitourinary: Negative.   Skin: Positive for itching and rash.   Physical Exam   Blood pressure 110/64, pulse 82, temperature 98.4 F (36.9 C), temperature source Oral, resp. rate 18, unknown if currently breastfeeding.  Physical Exam  Nursing note and vitals reviewed. Constitutional: She is oriented to person, place, and time. She appears well-developed and well-nourished. No distress.  HENT:  Head: Normocephalic and atraumatic.  Eyes: Conjunctivae are normal. Right eye exhibits no discharge. Left eye exhibits no discharge. No scleral icterus.  Neck: Normal range of motion.  Cardiovascular: Normal rate, regular rhythm and normal heart sounds.   No murmur heard. Respiratory: Effort normal and breath sounds normal. No respiratory distress. She has no wheezes.  GI: Soft.  Neurological: She is alert and oriented to person, place, and time.  Skin: Skin is warm and dry. Rash noted. Rash is macular. Rash is not pustular. She is not diaphoretic.  Rash on bilateral inner aspect of upper thigh. Macular erythematous rash.   C/section incision - well approximated. No warmth, no drainage, induration. Incision is in crease of lower abdomen - area appears  moist, likely from recent application of neosporin ointment. Area of skin a few cm below & above the incision appears red & irritated - these are 2 opposing surfaces that rub against each other d/t panus.   Psychiatric: She has a normal mood and affect. Her behavior is normal. Judgment and thought content normal.    MAU Course  Procedures Results for orders placed or performed during the hospital encounter of 11/05/14 (from the past 24 hour(s))  Urinalysis, Routine w reflex microscopic ([facility])     Status: Abnormal    Collection Time: 11/05/14  1:35 PM  Result Value Ref Range   Color, Urine YELLOW YELLOW   APPearance CLEAR CLEAR   Specific Gravity, Urine <1.005 (L) 1.005 - 1.030   pH 5.5 5.0 - 8.0   Glucose, UA NEGATIVE NEGATIVE mg/dL   Hgb urine dipstick LARGE (A) NEGATIVE   Bilirubin Urine NEGATIVE NEGATIVE   Ketones, ur NEGATIVE NEGATIVE mg/dL   Protein, ur NEGATIVE NEGATIVE mg/dL   Urobilinogen, UA 0.2 0.0 - 1.0 mg/dL   Nitrite NEGATIVE NEGATIVE   Leukocytes, UA NEGATIVE NEGATIVE  Urine microscopic-add on     Status: Abnormal   Collection Time: 11/05/14  1:35 PM  Result Value Ref Range   Squamous Epithelial / LPF FEW (A) RARE   WBC, UA 0-2 <3 WBC/hpf   RBC / HPF 3-6 <3 RBC/hpf  CBC     Status: Abnormal   Collection Time: 11/05/14  2:11 PM  Result Value Ref Range   WBC 8.3 4.0 - 10.5 K/uL   RBC 3.92 3.87 - 5.11 MIL/uL   Hemoglobin 13.0 12.0 - 15.0 g/dL   HCT 40.937.5 81.136.0 - 91.446.0 %   MCV 95.7 78.0 - 100.0 fL   MCH 33.2 26.0 - 34.0 pg   MCHC 34.7 30.0 - 36.0 g/dL   RDW 78.213.7 95.611.5 - 21.315.5 %   Platelets 412 (H) 150 - 400 K/uL    MDM No leukocytosis, pt afebrile, no evidence of infection at this time.  Educated patient about ways to keep incision dry.   Assessment and Plan  A: 1. Candidal skin infection   2. Skin irritation    P: Discharge home Rx nystatin cream for inner thighs Discussed reasons to return, including s/s infection Keep schedule post partum appt Keep area of incision dry; assess throughout day, don't apply ointment to area, can dry with blow dryer on low setting.   Judeth HornErin Cire Clute, NP  11/05/2014, 1:47 PM

## 2014-11-05 NOTE — Discharge Instructions (Signed)
Candidiasis cutnea  (Cutaneous Candidiasis) La candidiasis cutnea es un trastorno en el que hay un desarrollo excesivo de hongos (Cndida) en la piel. Los hongos normalmente viven en la piel, pero en pequeas cantidades y no causan ningn sntoma. En ciertos casos, un mayor desarrollo de los hongos puede causar una verdadera infeccin por hongos. Este tipo de infeccin ocurre generalmente en reas de la piel que son constantemente clidas y [place], como las axilas o la ingle. El hongo es la causa ms comn de dermatitis del paal en los bebs y en personas que no pueden controlar sus movimientos intestinales (incontinencia).  CAUSAS  El hongo que causa candidiasis cutnea con ms frecuencia es Candida albicans. Las [employer] pueden aumentar el riesgo de contraer una infeccin por hongos en la piel son:   Arman Bogus.  Embarazo.  Diabetes.  Tomar antibiticos.  Tomar pldoras anticonceptivas.  Tomar corticoides.  La enfermedad tiroidea.  Una deficiencia de hierro o zinc.  Problemas del sistema inmunolgico. SNTOMAS   Zona de la piel roja e hinchada.  Bultos en la piel.  Picazn. DIAGNSTICO  El diagnstico de la candidiasis cutnea se basa generalmente en su apariencia. Podrn realizarle unos ligeros raspados en la piel que se observarn bajo un microscopio para determinar la presencia de hongos.  TRATAMIENTO  Cremas antimicticas pueden aplicarse sobre la piel infectada. En los casos graves, pueden ser necesario tomar medicamentos por va oral.  INSTRUCCIONES PARA EL CUIDADO EN EL HOGAR   Mantenga la piel limpia y [place].  Mantenga un peso saludable.  Si tiene diabetes, mantenga bajo control el nivel de Dispensing optician. SOLICITE ATENCIN MDICA DE INMEDIATO SI:   Su erupcin contina extendindose a pesar del tratamiento.  Tiene fiebre, siente escalofros o dolor abdominal.   Esta informacin no tiene Marine scientist el consejo del mdico. Asegrese de  hacerle al mdico cualquier pregunta que tenga.   Document Released: 12/20/2010 Document Revised: 03/25/2011 Elsevier Interactive Patient Education 2016 [place] por cesrea - Cuidados posteriores  (Cesarean Delivery, Care After) Siga estas instrucciones durante las prximas semanas. Estas indicaciones le proporcionan informacin general acerca de cmo deber cuidarse despus del procedimiento. El mdico tambin podr darle instrucciones ms especficas. El tratamiento se ha planificado de acuerdo a las prcticas mdicas actuales, pero a veces se producen problemas. Comunquese con el mdico si tiene algn problema o tiene dudas cuando vuelva a su casa.  INSTRUCCIONES PARA EL CUIDADO EN EL HOGAR  Tome slo medicamentos de venta libre o recetados, segn las indicaciones del mdico.  No beba alcohol, especialmente si est amamantando o toma analgsicos.  Nomastique tabaco ni fume.  Contine con un adecuado cuidado perineal. El buen cuidado perineal incluye:  Higienizarse de adelante hacia atrs.  Mantener la zona perineal limpia.  Controlar diariamente el corte (incisin) y observar si aumenta el enrojecimiento, si supura, se hincha o se separa la piel.  Limpie la incisin suavemente con jabn y agua todos los das, y luego squela dando golpecitos. Si el mdico la autoriza, deje la incisin al descubierto. Use un apsito (vendaje) si drena lquido o la incisin parece irritada. Si las pequeas tiras [facility] que cruzan la incisin no se caen dentro de los 7 das, retrelas suavemente.  Abrace una almohada al toser o estornudar hasta que la incisin se cure. Esto ayuda a Best boy.  No conduzca vehculos ni opere maquinarias hasta que el mdico la autorice.  Dchese, lvese el cabello y tome baos de  inmersin segn las indicaciones de su mdico.  Utilice un sostn que le ajuste bien y que brinde buen soporte a sus Glass blower/designer.  Limite el uso de bombachas de sostn  o medias panty.  Beba suficiente lquido para Consulting civil engineer orina clara o de color amarillo plido.  Consuma todos los das alimentos ricos en fibra como cereales y panes [place], arroz, frijoles y frutas frescas y verduras. Estos alimentos pueden ayudarla a prevenir o Cytogeneticist.  Reanude las actividades como subir escaleras, conducir automviles, levantar objetos pesados, hacer ejercicios o viajar cuando le indique su mdico.  Hable con su mdico acerca de reanudar la actividad sexual. Volver a la actividad sexual depende del riesgo de infeccin, la velocidad de la curacin y la comodidad y su deseo de Financial controller.  Trate de que alguien la ayude con las actividades del hogar y con el recin nacido al menos durante algunos das despus de salir del hospital.  Descanse todo lo que pueda. Trate de descansar o tomar una siesta mientras el beb est durmiendo.  Aumente sus actividades gradualmente.  Cumpla con todos los controles programados para despus del [place]. Es muy importante asistir a todas las visitas de Nurse, adult. En estas visitas, su mdico va a controlarla para asegurarse de que est sanando fsica y emocionalmente. SOLICITE ATENCIN MDICA SI:   Elimina cogulos grandes por la vagina. Guarde algunos cogulos para mostrarle al mdico.  Tiene una secrecin con feo olor que proviene de la vagina.  Tiene dificultad para orinar.  Orina con frecuencia.  Siente dolor al [employer].  Nota un cambio en sus movimientos intestinales.  Aumenta el enrojecimiento, el dolor o la hinchazn en la zona de la incisin.  Observa que supura pus en la incisin.  La incisin se abre.  Sus [employer] duelen, estn duras o enrojecidas.  Sufre un dolor intenso de [place].  Tiene visin borrosa o ve manchas.  Se siente triste o deprimida.  Tiene pensamientos acerca de lastimarse o daar al recin nacido.  Tiene preguntas acerca de su cuidado, la atencin del recin  nacido o acerca de los medicamentos.  Se siente mareada o sufre un desmayo.  Tiene una erupcin.  Siente dolor u observa enrojecimiento o hinchazn en el sitio en que estaba la va intravenosa (IV).  Tiene nuseas o vmitos.  Usted dej de amamantar al beb y no ha tenido su perodo menstrual dentro de las 12 semanas siguientes.  No amamanta al beb y no tuvo su perodo menstrual en las ltimas 12 semanas.  Tiene fiebre. SOLICITE ATENCIN MDICA DE INMEDIATO SI:   Siente dolor persistente.  Siente dolor en el pecho.  Le falta el aire.  Se desmaya.  Siente dolor en la pierna.  Siente Research scientist (life sciences).  El sangrado vaginal satura dos o ms apsitos en 1 hora. ASEGRESE DE QUE:   Comprende estas instrucciones.  Controlar su enfermedad.  Recibir ayuda de inmediato si no mejora o si empeora.   Esta informacin no tiene Marine scientist el consejo del mdico. Asegrese de hacerle al mdico cualquier pregunta que tenga.   Document Released: 12/31/2004 Document Revised: 01/21/2014 Elsevier Interactive Patient Education [employer].

## 2014-11-05 NOTE — MAU Note (Signed)
Had C/S on 10/4 problem with incision red hot to the touch has a odor, has itching and burning in the groin area bilateral, burning with urination.

## 2014-11-17 ENCOUNTER — Encounter: Payer: Self-pay | Admitting: *Deleted

## 2014-12-01 ENCOUNTER — Encounter: Payer: Self-pay | Admitting: Medical

## 2014-12-01 ENCOUNTER — Ambulatory Visit (INDEPENDENT_AMBULATORY_CARE_PROVIDER_SITE_OTHER): Payer: Self-pay | Admitting: Medical

## 2014-12-01 DIAGNOSIS — Z8632 Personal history of gestational diabetes: Secondary | ICD-10-CM

## 2014-12-01 DIAGNOSIS — B372 Candidiasis of skin and nail: Secondary | ICD-10-CM

## 2014-12-01 DIAGNOSIS — B373 Candidiasis of vulva and vagina: Secondary | ICD-10-CM

## 2014-12-01 MED ORDER — TRIAMCINOLONE ACETONIDE 0.025 % EX OINT: 1.0000 "application " | TOPICAL_OINTMENT | Freq: Two times a day (BID) | CUTANEOUS | Status: DC

## 2014-12-01 NOTE — Progress Notes (Signed)
Subjective:     Kayla Scott is a 39 y.o. female who presents for a postpartum visit. She is 6 weeks postpartum following a low cervical transverse Cesarean section. I have fully reviewed the prenatal and intrapartum course. The delivery was at 39 gestational weeks. Outcome: repeat cesarean section, low vertical incision. Anesthesia: spinal. Postpartum course has been uncomplicated . Baby's course has been normal. Baby is feeding by both breast and bottle - Similac Advance. Bleeding no bleeding. Bowel function is normal. Bladder function is normal. Patient is sexually active. Contraception method is BTL. Postpartum depression screening: negative. Patient is concerned about some redness around the incision site and inguinal area.   The following portions of the patient's history were reviewed and updated as appropriate: allergies, current medications, past family history, past medical history, past social history, past surgical history and problem list.  Review of Systems Pertinent items are noted in HPI.   Objective:    BP 133/83 mmHg  Pulse 95  Temp(Src) 98 F (36.7 C)  Wt 254 lb (115.214 kg)  General:  alert and cooperative   Breasts:  not performed  Lungs: normal effort  Heart:  normal rate  Abdomen: soft, non-tender, incision is well-healed, mild surrounding erythema without drainage, edema or bleeding.    Vulva:  normal  Vagina: not evaluated  Cervix:  not evaluated  Corpus: not examined  Adnexa:  not evaluated  Rectal Exam: Not performed.      Assessment:     Normal postpartum exam. Pap smear not done at today's visit.  Pap smear due 04/2016.  Cutaneous yeast of the abdomen and inguinal Plan:    1. Contraception: tubal ligation 2. Rx for Triamcinolone sent to patient's pharmacy to be combined with Nystatin and applied to affected area once or twice/day for no more than 2 weeks 3. 2 hour GTT scheduled prior to leaving. Patient advised of need to be fasting.  4.   Follow up in: 1 year for annual exam or sooner as needed.    Marny LowensteinJulie N Wenzel, PA-C  12/01/2014 1:42 PM

## 2014-12-01 NOTE — Patient Instructions (Signed)
Cuidados en el postparto luego de un parto por cesrea  (Postpartum Care After Cesarean Delivery) Despus del parto (perodo de postparto), la estada normal en el hospital es de 24-72 horas. Si hubo problemas con el trabajo de parto o el parto, o si tiene otros problemas mdicos, es posible que deba permanecer en el hospital por ms tiempo.  Mientras est en el hospital, recibir ayuda e instrucciones sobre cmo cuidar de usted misma y de su beb recin nacido durante el postparto.  Mientras est en el hospital:   Es normal que sienta dolor o molestias en la incisin en el abdomen. Asegrese de decirle a las enfermeras si siente dolor, as como donde siente el dolor y qu empeora el dolor.  Si est amamantando, puede sentir contracciones dolorosas en el tero durante algunas semanas. Esto es normal. Las contracciones ayudan a que el tero vuelva a su tamao normal.  Es normal tener algo de sangrado despus del parto.  Durante los primeros 1-3 das despus del parto, el flujo es de color rojo y la cantidad puede ser similar a un perodo.  Es frecuente que el flujo se inicie y se detenga.  En los primeros das, puede eliminar algunos cogulos pequeos. Informe a las enfermeras si comienza a eliminar cogulos grandes o aumenta el flujo.  No  elimine los cogulos de sangre por el inodoro antes de que la enfermera los vea.  Durante los prximos 3 a 10 das despus del parto, el flujo debe ser ms acuoso y rosado o marrn.  De diez a catorce das despus del parto, el flujo debe ser una pequea cantidad de secrecin de color blanco amarillento.  La cantidad de flujo disminuir en las primeras semanas despus del parto. El flujo puede detenerse en 6-8 semanas. La mayora de las mujeres no tienen ms flujo a las 12 semanas despus del parto.  Usted debe cambiar sus apsitos con frecuencia.  Lvese bien las manos con agua y jabn durante al menos 20 segundos despus de cambiar el apsito, usar el  bao o antes de sostener o alimentar a su recin nacido.  Se le quitar la va intravenosa (IV) cuando ya est bebiendo suficientes lquidos.  El tubo de drenaje para la orina (catter urinario) que se inserta antes del parto puede ser retirado luego de 6-8 horas despus del parto o cuando las piernas vuelvan a tener sensibilidad. Usted puede sentir que tiene que vaciar la vejiga durante las primeras 6-8 horas despus de que le quiten el catter.  Si se siente dbil, mareada o se desmaya, llame a su enfermera antes de levantarse de la cama por primera vez y antes de tomar una ducha por primera vez.  En los primeros das despus del parto, podr sentir las mamas sensibles y llenas. Esto se llama congestin. La sensibilidad en los senos por lo general desaparece dentro de las 48-72 horas despus de que ocurre la congestin. Tambin puede notar que la leche se escapa de sus senos. Si no est amamantando no estimule sus pechos. La estimulacin de las mamas hace que sus senos produzcan ms leche.  Pasar tanto tiempo como sea posible con el beb recin nacido es muy importante. Durante este tiempo, usted y su beb deben sentirse cerca y conocerse uno al otro. Tener al beb en su habitacin (alojamiento conjunto) ayudar a fortalecer el vnculo con el beb recin nacido. Esto le dar tiempo para conocerlo y atenderlo de manera cmoda.  Las hormonas se modifican despus del parto. A   veces, los cambios hormonales pueden causar tristeza o ganas de llorar por un tiempo. Estos sentimientos no deben durar ms de unos pocos das. Si duran ms que eso, debe hablar con su mdico.  Si lo desea, hable con su mdico acerca de los mtodos de planificacin familiar o mtodos anticonceptivos.  Hable con su mdico acerca de las vacunas. El mdico puede indicarle que se aplique las siguientes vacunas antes de salir del hospital:  Vacuna contra el ttanos, la difteria y la tos ferina (Tdap) o el ttanos y la difteria (Td).  Es muy importante que usted y su familia (incluyendo a los abuelos) u otras personas que cuidan al recin nacido estn al da con las vacunas Tdap o Td. Las vacunas Tdap o Td pueden ayudar a proteger al recin nacido de enfermedades.  Inmunizacin contra la rubola.  Inmunizacin contra la varicela.  Inmunizacin contra la gripe. Usted debe recibir esta vacunacin anual si no la ha recibido durante el embarazo.   Esta informacin no tiene como fin reemplazar el consejo del mdico. Asegrese de hacerle al mdico cualquier pregunta que tenga.   Document Released: 12/18/2011 Elsevier Interactive Patient Education 2016 Elsevier Inc.  

## 2014-12-06 ENCOUNTER — Other Ambulatory Visit: Payer: Self-pay

## 2014-12-06 DIAGNOSIS — O24429 Gestational diabetes mellitus in childbirth, unspecified control: Secondary | ICD-10-CM

## 2014-12-07 LAB — GLUCOSE TOLERANCE, 2 HOURS
Glucose, 2 hour: 144 mg/dL — ABNORMAL HIGH (ref 70–139)
Glucose, Fasting: 93 mg/dL (ref 65–99)

## 2016-02-06 IMAGING — US US OB LIMITED
1 series · 13 of 28 positions shown · non-contrast
Comparison: none

[Series 1: us ob limited · 0.27mm/px · 43 acquisitions, 13 frames shown]
[im 2/43]
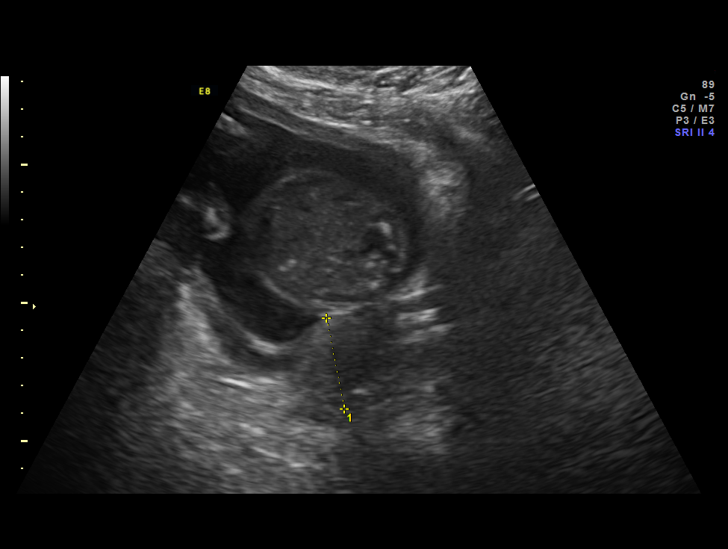
[im 5/43]
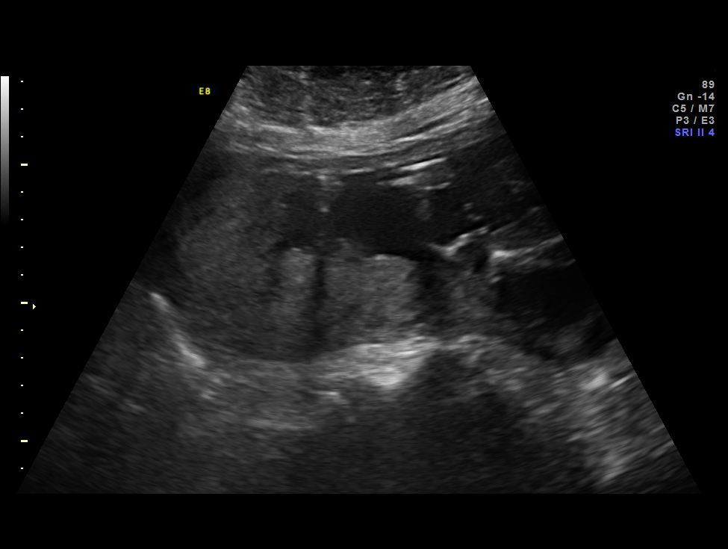
[im 8/43]
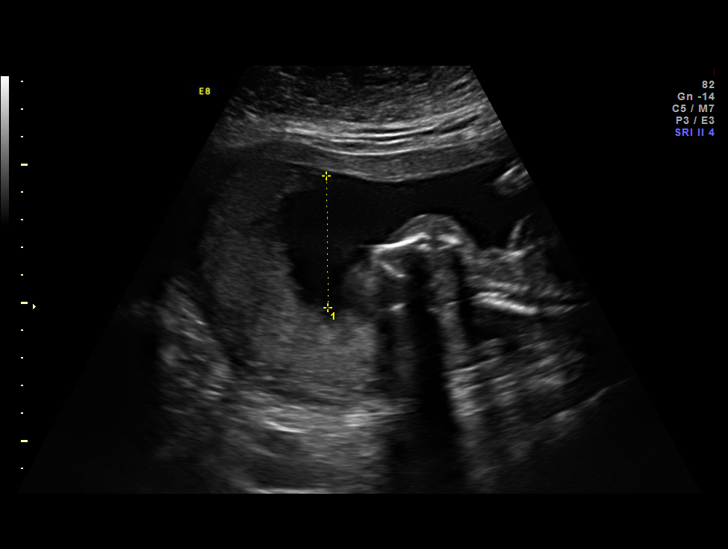
[im 11/43]
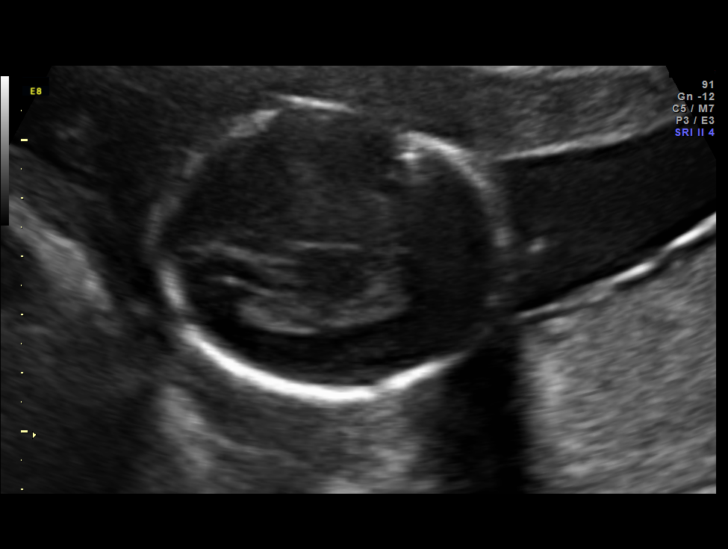
[im 15/43]
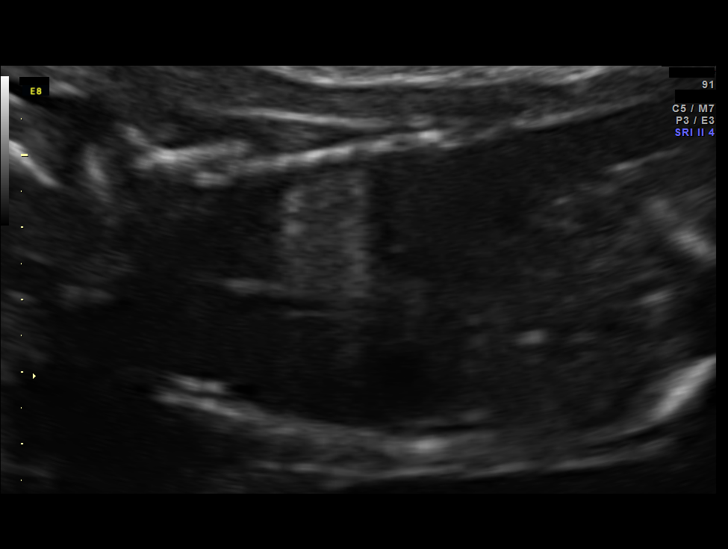
[im 18/43]
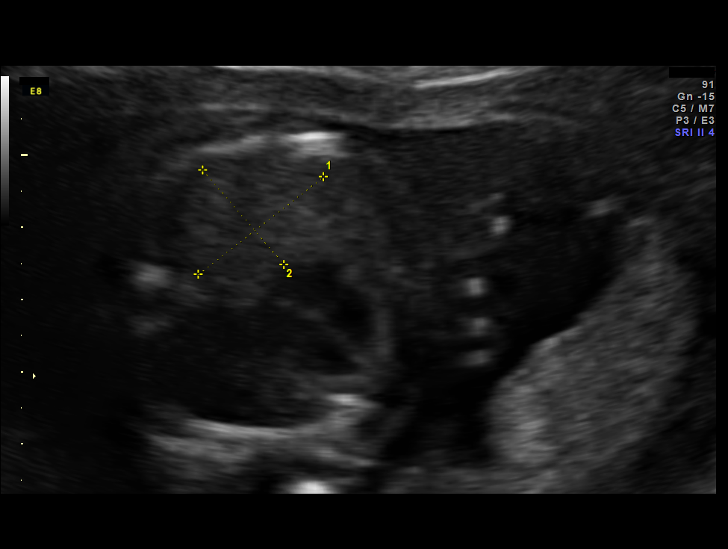
[im 22/43]
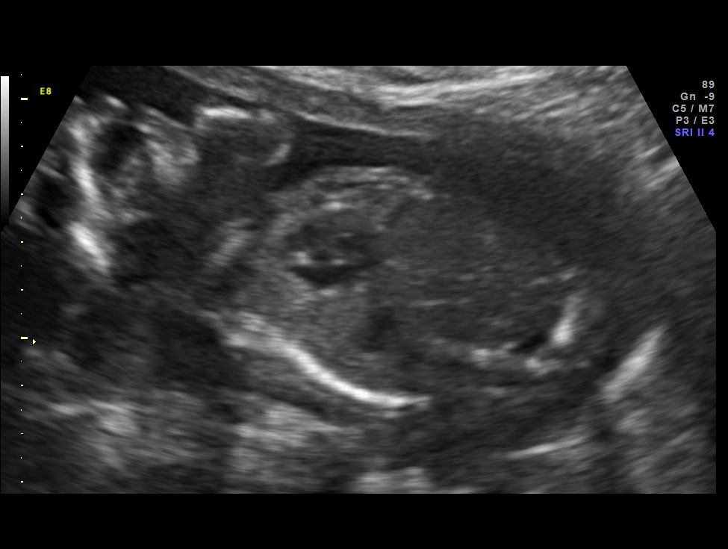
[im 25/43]
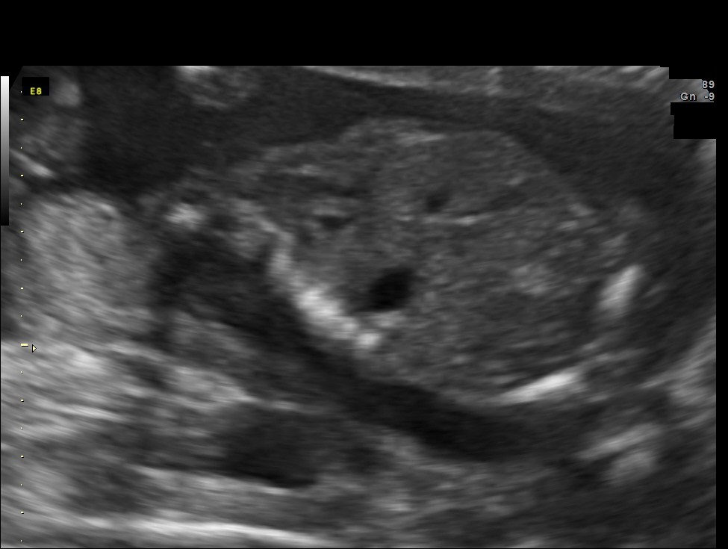
[im 29/43]
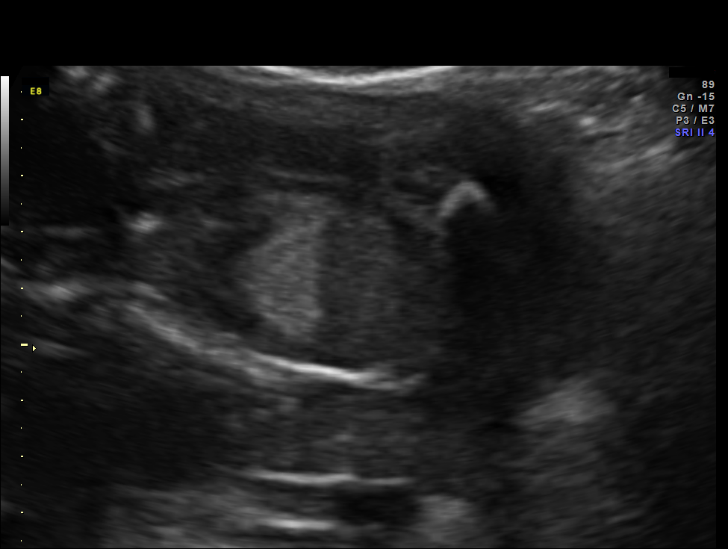
[im 32/43]
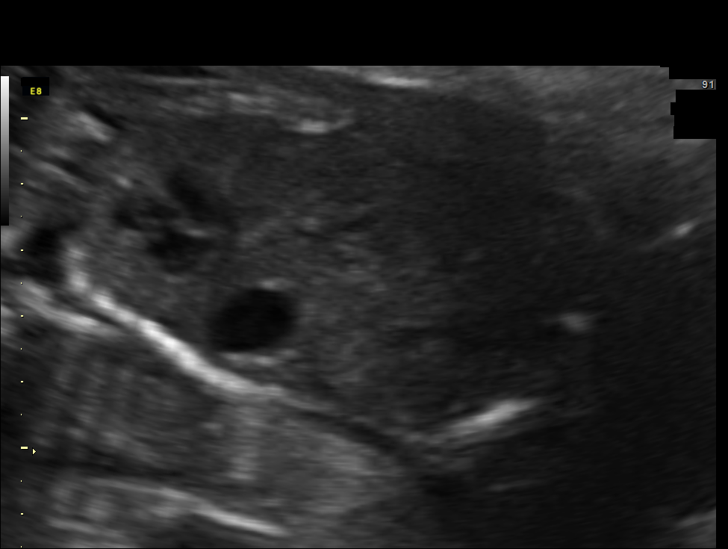
[im 35/43]
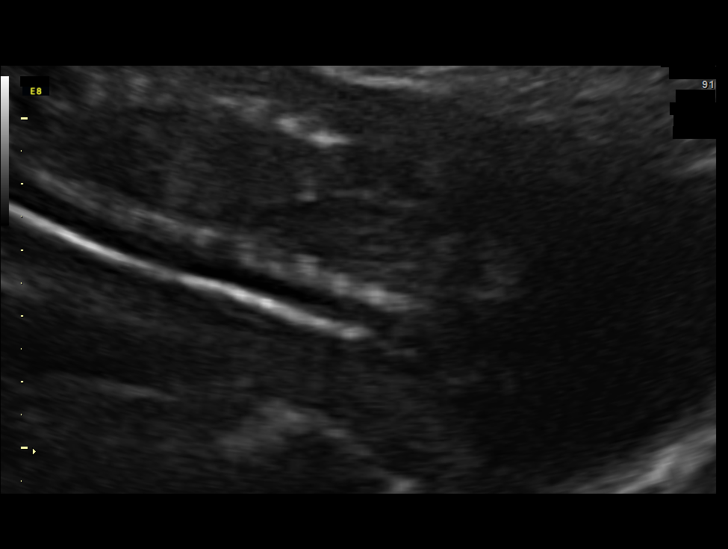
[im 38/43]
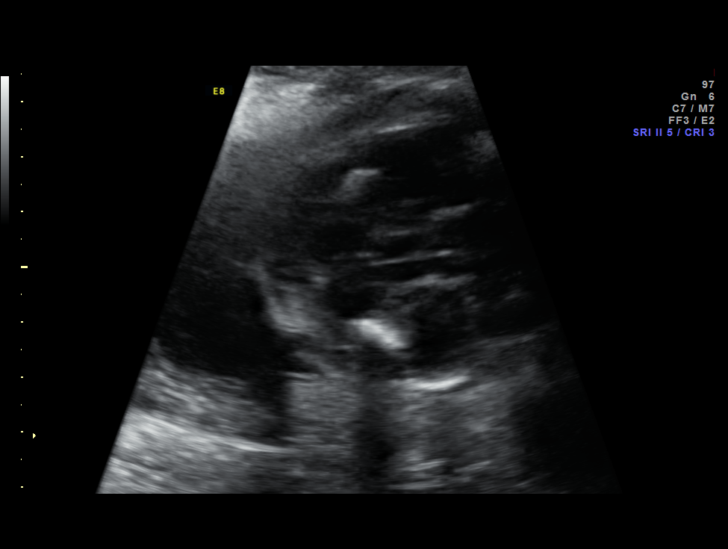
[im 41/43]
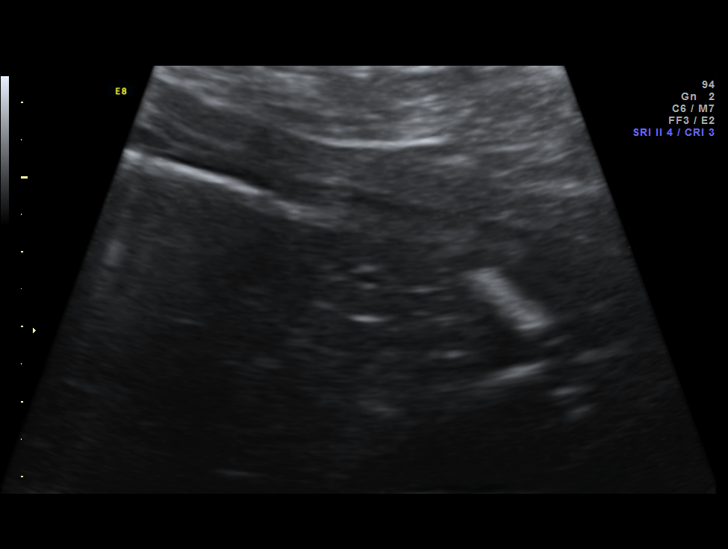

[13 of 28 positions shown; findings below may reference images not displayed]

OBSTETRICS REPORT
(Signed Final 06/17/2014 [DATE])

MYNOR AUGUSTO

Service(s) Provided

[HOSPITAL]                                         [address]15.0
Indications

Advanced maternal age multigravida (39), second
trimester; declined testing
Previous cesarean section x 2
Poor obstetric history: Previous preterm delivery
(34 wks-previa)
Diabetes - Pregestational, 2nd trimester - no meds
Abnormal biochemical screen (quad) for Trisomy
21 ([DATE]); declined testing
21 weeks gestation of pregnancy
Fetal abnormality - other known or suspected -
CPAM type 3
Fetal ECHO - normal
Fetal Evaluation

Num Of Fetuses:    1
Fetal Heart Rate:  142                          bpm
Cardiac Activity:  Observed
Presentation:      Breech
Placenta:          Posterior, above cervical
os
P. Cord            Marginal insertion
Insertion:

Amniotic Fluid
AFI FV:      Subjectively within normal limits
Larg Pckt:     4.8  cm
Gestational Age

LMP:           24w 0d        Date:  12/31/13                 EDD:   10/07/14
Best:          21w 3d     Det. By:  Early Ultrasound         EDD:   10/25/14
(04/24/14)
Anatomy

Cranium:          Previously seen        Aortic Arch:      Previously seen
Fetal Cavum:      Previously seen        Ductal Arch:      Previously seen
Ventricles:       Previously seen        Diaphragm:        Appears normal
Choroid Plexus:   Previously seen        Stomach:          Appears normal, left
sided
Cerebellum:       Appears normal         Abdomen:          Previously seen
Posterior Fossa:  Appears normal         Abdominal Wall:   Previously seen
Nuchal Fold:      Not applicable (>20    Cord Vessels:     2 vessel cord,
wks GA)
absent Robe Marmol
Face:             Orbits and profile     Kidneys:          Appear normal
previously seen
Lips:             Previously seen        Bladder:          Appears normal
Heart:            Previously seen        Spine:            Previously seen
RVOT:             Previously seen        Lower             Previously seen
Extremities:
LVOT:             Previously seen        Upper             Previously seen
Extremities:

Other:  Fetus appears to be a female. Hyperechoic chest mass measuring
2.2 x 1.7 x 1.7cm.
Cervix Uterus Adnexa

Cervical Length:    3.3      cm

Cervix:       Normal appearance by transabdominal scan. Appears
closed, without funnelling.
Impression

SIUP at 21+3 weeks
CPAM type 3; SUA
Normal amniotic fluid volume; no hydrops
Recommendations

Follow-up ultrasound in 2 weeks to reassess CPAM and fetal
growth

[facility] with us.  Please do not hesitate to

## 2016-03-29 ENCOUNTER — Ambulatory Visit: Payer: Self-pay

## 2016-03-29 ENCOUNTER — Telehealth: Payer: Self-pay | Admitting: Emergency Medicine

## 2016-03-29 NOTE — Telephone Encounter (Signed)
Pt family called in wanting to bring mother in for complains of left jaw numbness with pain.  Advised to directly to ER if for immediate care.  Verbalized understanding

## 2016-04-29 IMAGING — US US MFM OB LIMITED
1 series · 13 of 21 positions shown · non-contrast
Comparison: none

[Series 1: us ob mfm follow up · 21 acquisitions, 13 frames shown]
[im 1/21]
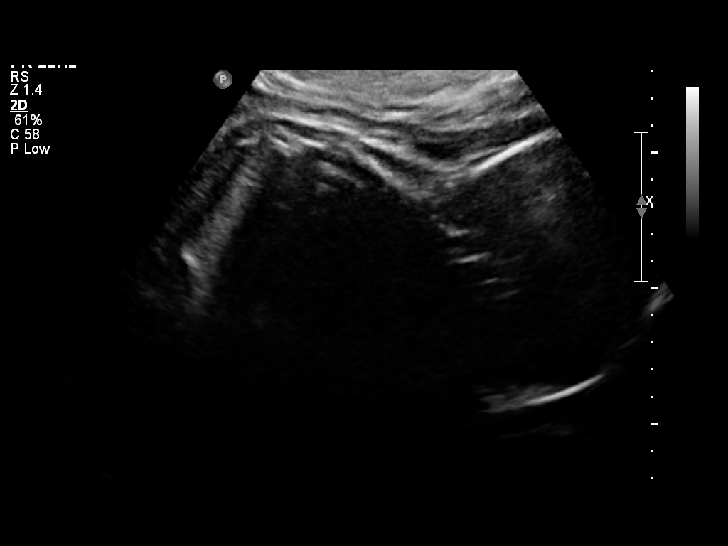
[im 3/21]
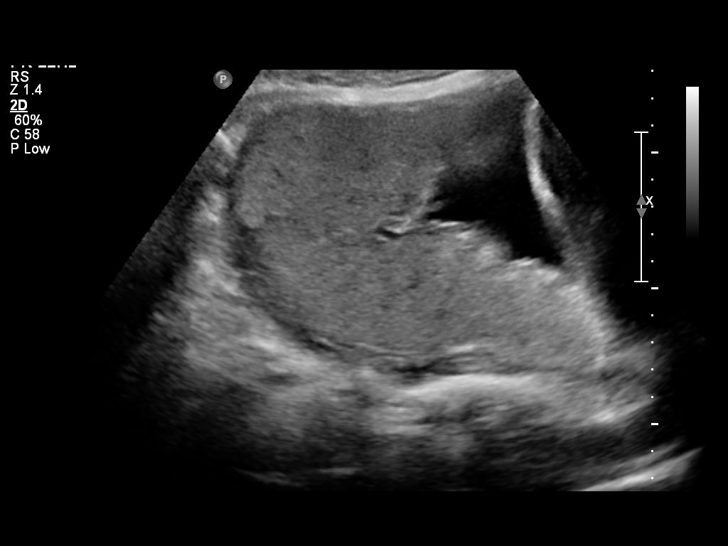
[im 5/21]
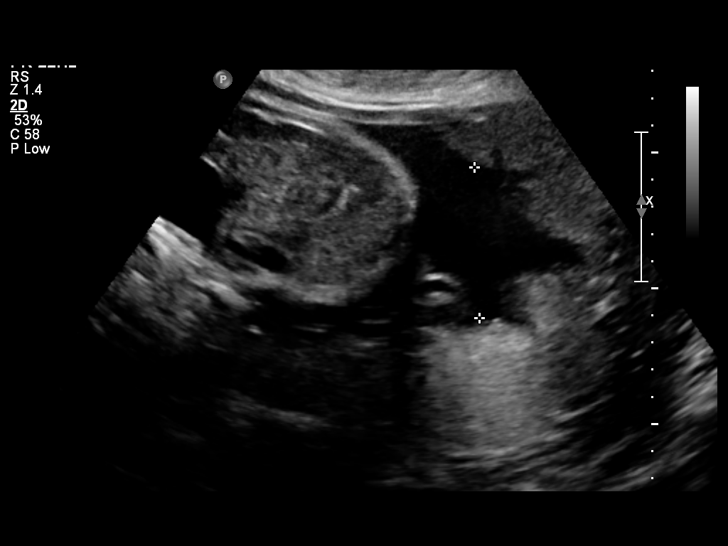
[im 6/21]
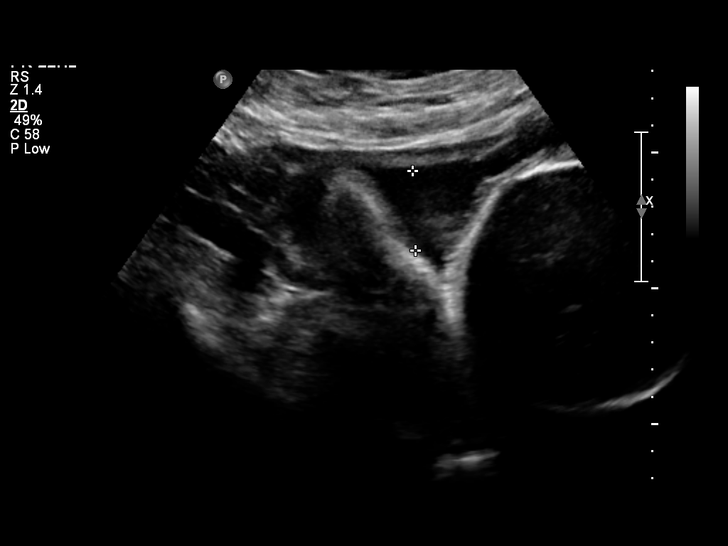
[im 8/21]
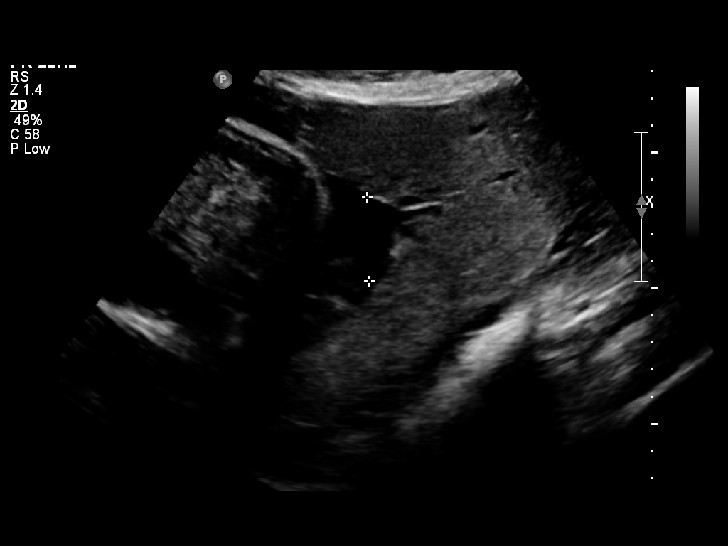
[im 9/21]
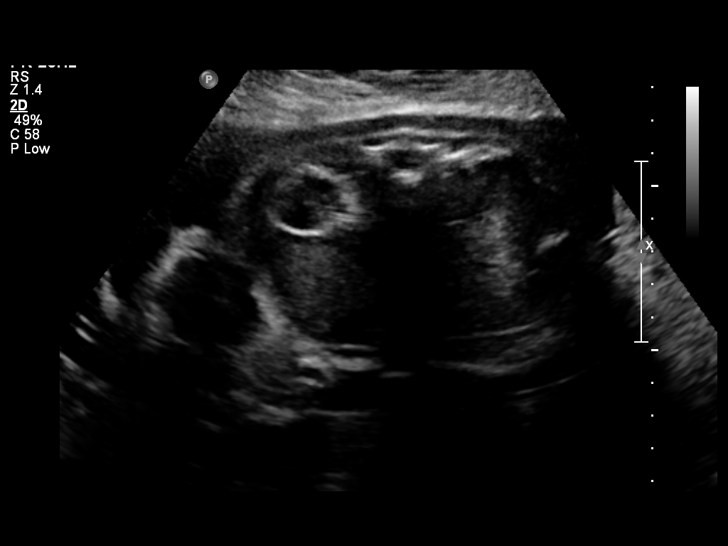
[im 11/21]
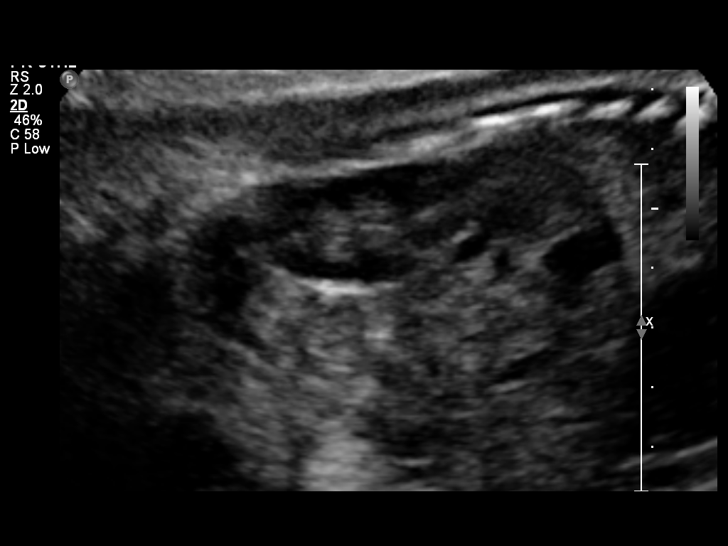
[im 13/21]
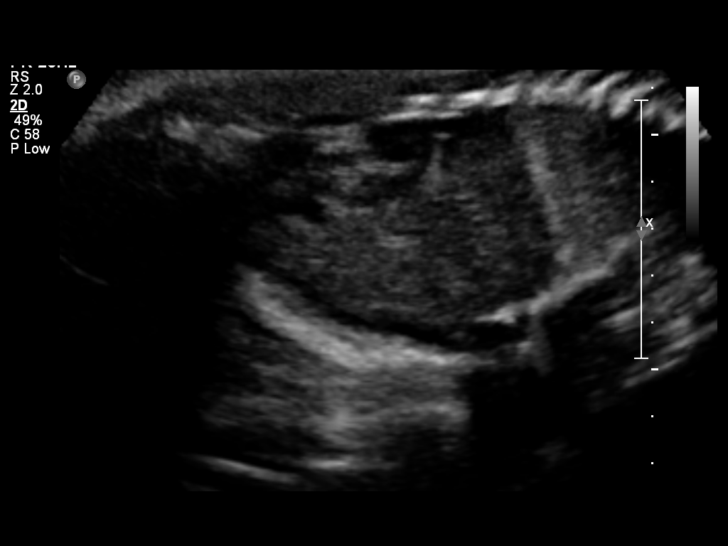
[im 14/21]
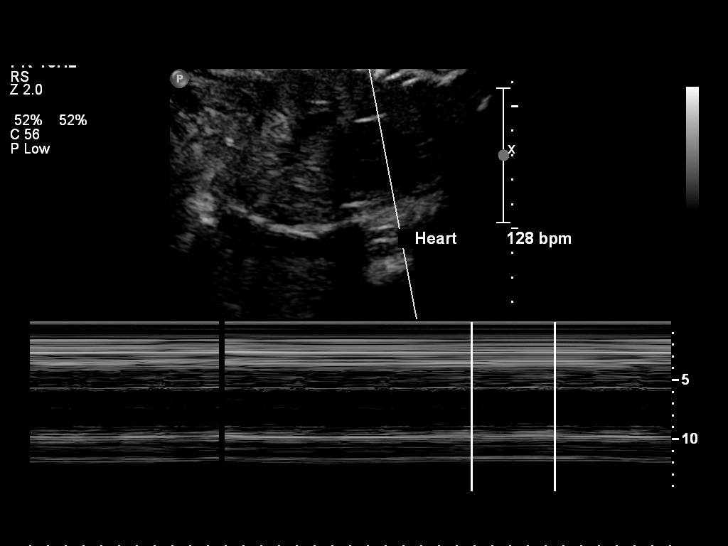
[im 16/21]
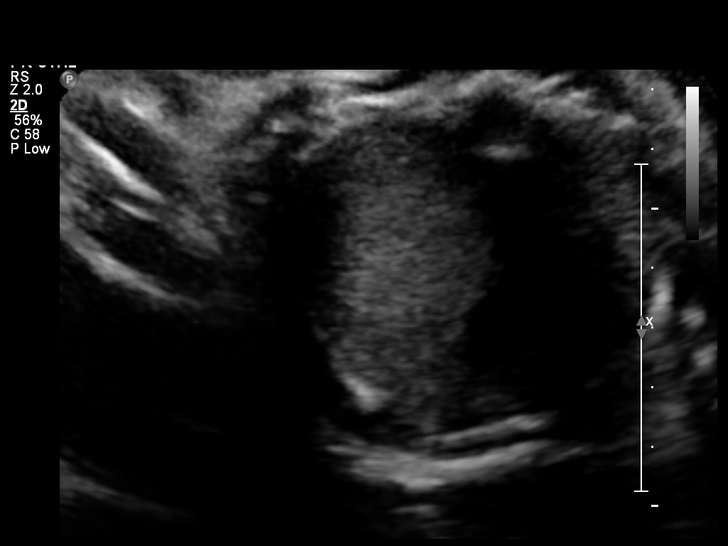
[im 17/21]
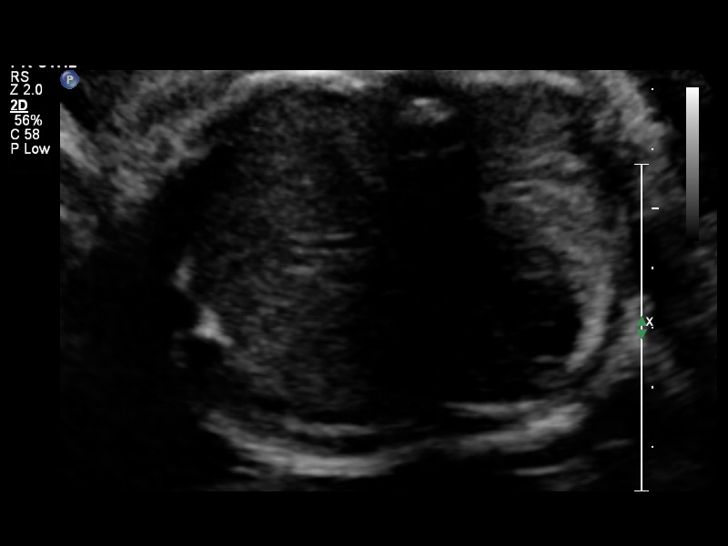
[im 19/21]
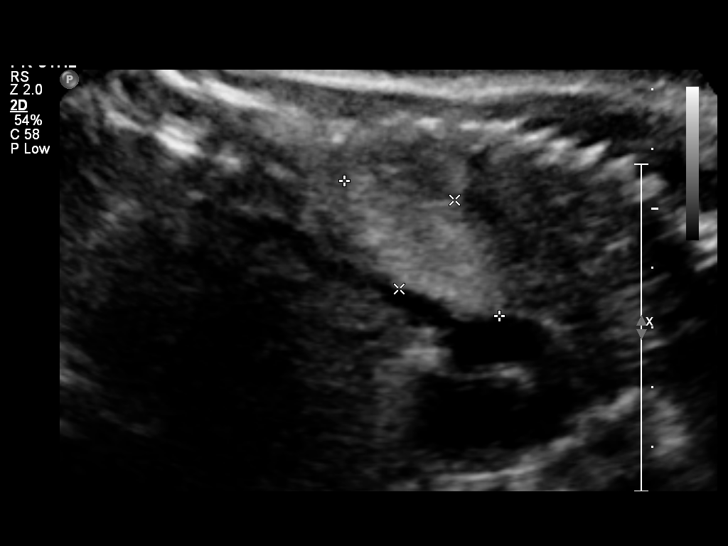
[im 21/21]
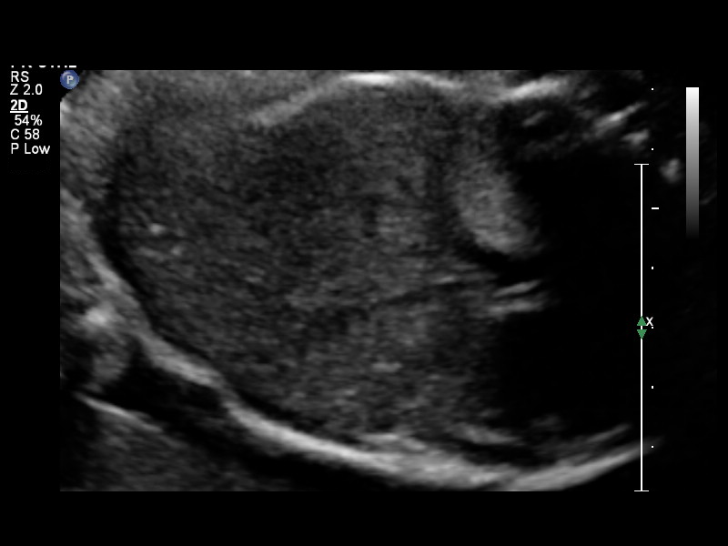

[13 of 21 positions shown; findings below may reference images not displayed]

OBSTETRICS REPORT
(Signed Final 09/08/2014 [DATE])

Name:       STINA KANE                   Visit  09/08/2014 [DATE]
BATTISTE                               Date:

Service(s) Provided

US MFM OB LIMITED                                      76815.01
Indications

Fetal abnormality - other known or suspected
(CPAM type 3 on right)
2 vessel umbilical cord
Abnormal biochemical screen (quad) for Trisomy
21 ([DATE]); declined further testing
Advanced maternal age multigravida 36+, third
trimester declined testing
Previous cesarean section x 2
Poor obstetric history: Previous preterm delivery
(34 wks, previa)
Gestational diabetes in pregnancy, unspecified
control (metformin prescribed, pt not taking)
Obesity complicating pregnancy, third trimester
33 weeks gestation of pregnancy
Fetal Evaluation

Num Of             1
Fetuses:
Fetal Heart        128                          bpm
Rate:
Cardiac Activity:  Observed
Presentation:      Cephalic
Placenta:          Posterior, above cervical
os
P. Cord            Previously Visualized
Insertion:

Amniotic Fluid
AFI FV:      Subjectively within normal limits
AFI Sum:     16.54    cm      60  %Tile     Larg Pckt:    5.53   cm
RUQ:   5.53    cm    RLQ:   2.92    cm   LUQ:    3.08    cm   LLQ:    5.01   cm
Gestational Age
LMP:           35w 6d        Date:  12/31/13                  EDD:   10/07/14
Best:          33w 2d    Det. By:   Early Ultrasound          EDD:   10/25/14
(04/24/14)
Cervix Uterus Adnexa

Cervix:       Not visualized (advanced GA >31wks)
Impression

SIUP at 33+2 weeks
Normal amniotic fluid volume
Recommendations

Continue twice weekly NSTs with weekly AFIs

[facility] with us.  Please do not hesitate to

## 2016-07-15 ENCOUNTER — Encounter (HOSPITAL_COMMUNITY): Payer: Self-pay | Admitting: Emergency Medicine

## 2016-07-15 ENCOUNTER — Emergency Department (HOSPITAL_COMMUNITY): Payer: Self-pay

## 2016-07-15 ENCOUNTER — Emergency Department (HOSPITAL_COMMUNITY)
Admission: EM | Admit: 2016-07-15 | Discharge: 2016-07-15 | Disposition: A | Discharge status: Home / Self Care | Payer: Self-pay | Attending: Emergency Medicine | Admitting: Emergency Medicine

## 2016-07-15 DIAGNOSIS — E119 Type 2 diabetes mellitus without complications: Secondary | ICD-10-CM | POA: Insufficient documentation

## 2016-07-15 DIAGNOSIS — R102 Pelvic and perineal pain: Secondary | ICD-10-CM | POA: Insufficient documentation

## 2016-07-15 LAB — CBC WITH DIFFERENTIAL/PLATELET
BASOS ABS: 0 10*3/uL (ref 0.0–0.1)
BASOS PCT: 0 %
Eosinophils Absolute: 0.3 10*3/uL (ref 0.0–0.7)
Eosinophils Relative: 3 %
HEMATOCRIT: 38.8 % (ref 36.0–46.0)
Hemoglobin: 12.9 g/dL (ref 12.0–15.0)
LYMPHS PCT: 38 %
Lymphs Abs: 3.3 10*3/uL (ref 0.7–4.0)
MCH: 31.2 pg (ref 26.0–34.0)
MCHC: 33.2 g/dL (ref 30.0–36.0)
MCV: 93.7 fL (ref 78.0–100.0)
Monocytes Absolute: 0.7 10*3/uL (ref 0.1–1.0)
Monocytes Relative: 9 %
Neutro Abs: 4.4 10*3/uL (ref 1.7–7.7)
Neutrophils Relative %: 50 %
PLATELETS: 267 10*3/uL (ref 150–400)
RBC: 4.14 MIL/uL (ref 3.87–5.11)
RDW: 13.4 % (ref 11.5–15.5)
WBC: 8.7 10*3/uL (ref 4.0–10.5)

## 2016-07-15 LAB — URINALYSIS, ROUTINE W REFLEX MICROSCOPIC
BACTERIA UA: NONE SEEN
Bilirubin Urine: NEGATIVE
GLUCOSE, UA: NEGATIVE mg/dL
Hgb urine dipstick: NEGATIVE
Ketones, ur: NEGATIVE mg/dL
Nitrite: NEGATIVE
PROTEIN: NEGATIVE mg/dL
Specific Gravity, Urine: 1.016 (ref 1.005–1.030)
pH: 6 (ref 5.0–8.0)

## 2016-07-15 LAB — WET PREP, GENITAL
Clue Cells Wet Prep HPF POC: NONE SEEN
SPERM: NONE SEEN
TRICH WET PREP: NONE SEEN
YEAST WET PREP: NONE SEEN

## 2016-07-15 LAB — BASIC METABOLIC PANEL
ANION GAP: 6 (ref 5–15)
BUN: 12 mg/dL (ref 6–20)
CALCIUM: 8.5 mg/dL — AB (ref 8.9–10.3)
CO2: 24 mmol/L (ref 22–32)
Chloride: 107 mmol/L (ref 101–111)
Creatinine, Ser: 0.65 mg/dL (ref 0.44–1.00)
GFR calc Af Amer: >60 mL/min (ref >60)
Glucose, Bld: 110 mg/dL — ABNORMAL HIGH (ref 65–99)
POTASSIUM: 4.2 mmol/L (ref 3.5–5.1)
SODIUM: 137 mmol/L (ref 135–145)

## 2016-07-15 LAB — GC/CHLAMYDIA PROBE AMP (~~LOC~~) NOT AT ARMC
Chlamydia: NEGATIVE
NEISSERIA GONORRHEA: NEGATIVE

## 2016-07-15 LAB — POC URINE PREG, ED: PREG TEST UR: NEGATIVE

## 2016-07-15 MED ORDER — IBUPROFEN 600 MG PO TABS: 600.0000 mg | ORAL_TABLET | Freq: Three times a day (TID) | ORAL | 0 refills | Status: DC | PRN

## 2016-07-15 MED ORDER — FENTANYL CITRATE (PF) 100 MCG/2ML IJ SOLN
25.0000 ug | Freq: Once | INTRAMUSCULAR | Status: AC
  Administered 2016-07-15: 25 ug via INTRAVENOUS
  Filled 2016-07-15: qty 2

## 2016-07-15 MED ORDER — FENTANYL CITRATE (PF) 100 MCG/2ML IJ SOLN
50.0000 ug | Freq: Once | INTRAMUSCULAR | Status: AC
  Administered 2016-07-15: 50 ug via INTRAVENOUS
  Filled 2016-07-15: qty 2

## 2016-07-15 MED ORDER — ONDANSETRON HCL 4 MG/2ML IJ SOLN
4.0000 mg | Freq: Once | INTRAMUSCULAR | Status: AC
  Administered 2016-07-15: 4 mg via INTRAVENOUS
  Filled 2016-07-15: qty 2

## 2016-07-15 MED ORDER — KETOROLAC TROMETHAMINE 30 MG/ML IJ SOLN
15.0000 mg | Freq: Once | INTRAMUSCULAR | Status: AC
  Administered 2016-07-15: 15 mg via INTRAVENOUS
  Filled 2016-07-15: qty 1

## 2016-07-15 MED ORDER — SODIUM CHLORIDE 0.9 % IV BOLUS (SEPSIS)
1000.0000 mL | Freq: Once | INTRAVENOUS | Status: AC
  Administered 2016-07-15: 1000 mL via INTRAVENOUS

## 2016-07-15 MED ORDER — IOPAMIDOL (ISOVUE-300) INJECTION 61%
INTRAVENOUS | Status: AC
  Administered 2016-07-15: 100 mL
  Filled 2016-07-15: qty 100

## 2016-07-15 NOTE — ED Triage Notes (Signed)
Pt reports pelvic pain/lower abd pain present since Friday. Denies N/V/D.

## 2016-07-15 NOTE — ED Provider Notes (Signed)
[facility]-EMERGENCY DEPT Provider Note   CSN: [id number] Arrival date & time: 07/15/16  0320     History   Chief Complaint Chief Complaint  Patient presents with  . Pelvic Pain    HPI Kayla Scott is a 41 y.o. female.  The history is provided by the patient and a relative. A language interpreter was used (spanish [phone]).  Pelvic Pain  This is a new problem. The current episode started 2 days ago. The problem occurs hourly. The problem has been gradually worsening. Associated symptoms include abdominal pain. Exacerbated by: palpation. The symptoms are relieved by rest.   Pt reports RLQ/pelvic pain for past 2 days No fever/vomiting No dysuria No vag bleeding/discharge She reports having this pain in the past but not recently  Past Medical History:  Diagnosis Date  . Diabetes mellitus without complication (HCC) 2016  . Gestational diabetes   . UTI (lower urinary tract infection)     Patient Active Problem List   Diagnosis Date Noted  . S/P C-section 10/18/2014    Past Surgical History:  Procedure Laterality Date  . CESAREAN SECTION    . CESAREAN SECTION WITH BILATERAL TUBAL LIGATION Bilateral 10/18/2014   Procedure: REPEAT CESAREAN SECTION WITH BILATERAL TUBAL LIGATION;  Surgeon: Adam Phenix, MD;  Location: [facility];  Service: Obstetrics;  Laterality: Bilateral;    OB History    Gravida Para Term Preterm AB Living   8 5 4 1 3 5    SAB TAB Ectopic Multiple Live Births   3     0 5       Home Medications    Prior to Admission medications   Medication Sig Start Date End Date Taking? Authorizing Provider  docusate sodium (COLACE) 100 MG capsule Take 1 capsule (100 mg total) by mouth 2 (two) times daily. Patient not taking: Reported on 11/05/2014 10/20/14   Federico Flake, MD  nystatin cream (MYCOSTATIN) Apply to affected area 2 times daily Patient not taking: Reported on 07/15/2016 11/05/14   Judeth Horn, NP  oxyCODONE-acetaminophen  (PERCOCET/ROXICET) 5-325 MG tablet Take 1 tablet by mouth every 6 (six) hours as needed for severe pain (for pain scale 4-7). Patient not taking: Reported on 11/05/2014 10/20/14   Federico Flake, MD  Prenatal Vit-Fe Fumarate-FA (PRENATAL COMPLETE) 14-0.4 MG TABS Take 1 tablet by mouth daily. Patient not taking: Reported on 07/15/2016 09/08/14   Levie Heritage, DO  triamcinolone (KENALOG) 0.025 % ointment Apply 1 application topically 2 (two) times daily. Patient not taking: Reported on 07/15/2016 12/01/14   Marny Lowenstein, PA-C    Family History Family History  Problem Relation Age of Onset  . Diabetes Mother   . Cancer Paternal Grandmother        uterine    Social History Social History  Substance Use Topics  . Smoking status: Never Smoker  . Smokeless tobacco: Never Used  . Alcohol use No     Allergies   Metformin and related   Review of Systems Review of Systems  Constitutional: Negative for fever.  Gastrointestinal: Positive for abdominal pain.  Genitourinary: Positive for pelvic pain. Negative for dyspareunia, dysuria, vaginal bleeding and vaginal discharge.  All other systems reviewed and are negative.    Physical Exam Updated Vital Signs BP 105/74 (BP Location: Right Arm)   Pulse 74   Temp 97.4 F (36.3 C) (Oral)   Resp 18   Ht 1.626 m (5\' 4" )   Wt 113.4 kg (250 lb)   SpO2  100%   BMI 42.91 kg/m   Physical Exam  CONSTITUTIONAL: Well developed/well nourished HEAD: Normocephalic/atraumatic EYES: EOMI/PERRL ENMT: Mucous membranes moist NECK: supple no meningeal signs SPINE/BACK:entire spine nontender CV: S1/S2 noted, no murmurs/rubs/gallops noted LUNGS: Lungs are clear to auscultation bilaterally, no apparent distress ABDOMEN: soft, nontender, no rebound or guarding, bowel sounds noted throughout abdomen GU:no cva tenderness Pelvic - no CMT, no vag bleeding, no discharge, no adnexal tenderness, limited due to body size, nurse Tori present for  entire exam NEURO: Pt is awake/alert/appropriate, moves all extremitiesx4.  No facial droop.   EXTREMITIES: pulses normal/equal, full ROM SKIN: warm, color normal PSYCH: no abnormalities of mood noted, alert and oriented to situation  ED Treatments / Results  Labs (all labs ordered are listed, but only abnormal results are displayed) Labs Reviewed  WET PREP, GENITAL - Abnormal; Notable for the following:       Result Value   WBC, Wet Prep HPF POC MANY (*)    All other components within normal limits  URINALYSIS, ROUTINE W REFLEX MICROSCOPIC - Abnormal; Notable for the following:    Leukocytes, UA TRACE (*)    Squamous Epithelial / LPF 0-5 (*)    All other components within normal limits  BASIC METABOLIC PANEL - Abnormal; Notable for the following:    Glucose, Bld 110 (*)    Calcium 8.5 (*)    All other components within normal limits  CBC WITH DIFFERENTIAL/PLATELET  POC URINE PREG, ED  GC/CHLAMYDIA PROBE AMP ([place]    EKG  EKG Interpretation None       Radiology Ct Abdomen Pelvis W Contrast  Result Date: 07/15/2016 CLINICAL DATA:  Acute onset of right lower quadrant abdominal pain. EXAM: CT ABDOMEN AND PELVIS WITH CONTRAST TECHNIQUE: Multidetector CT imaging of the abdomen and pelvis was performed using the standard protocol following bolus administration of intravenous contrast. CONTRAST:  100mL ISOVUE-300 IOPAMIDOL (ISOVUE-300) INJECTION 61% COMPARISON:  Pelvic ultrasound performed 10/03/2014, and CT of the abdomen and pelvis from 04/10/2013 FINDINGS: Lower chest: The visualized lung bases are grossly clear. The visualized portions of the mediastinum are unremarkable. Hepatobiliary: The liver is unremarkable in appearance. The gallbladder is unremarkable in appearance. The common bile duct remains normal in caliber. Pancreas: The pancreas is within normal limits. Spleen: The spleen is unremarkable in appearance. Adrenals/Urinary Tract: The adrenal glands are  unremarkable in appearance. The kidneys are within normal limits. There is no evidence of hydronephrosis. No renal or ureteral stones are identified. No perinephric stranding is seen. Stomach/Bowel: The stomach is unremarkable in appearance. The small bowel is within normal limits. The appendix is normal in caliber, without evidence of appendicitis. The colon is unremarkable in appearance. Vascular/Lymphatic: The abdominal aorta is unremarkable in appearance. The inferior vena cava is grossly unremarkable. No retroperitoneal lymphadenopathy is seen. No pelvic sidewall lymphadenopathy is identified. Reproductive: The bladder is mildly distended and grossly unremarkable. The uterus is unremarkable in appearance. Bilateral tubal ligation clips are noted. The ovaries are relatively symmetric. No suspicious adnexal masses are seen. Other: No additional soft tissue abnormalities are seen. Musculoskeletal: No acute osseous abnormalities are identified. The visualized musculature is unremarkable in appearance. IMPRESSION: Unremarkable contrast-enhanced CT of the abdomen and pelvis. Electronically Signed   By: Roanna RaiderJeffery  Chang M.D.   On: 07/15/2016 06:09    Procedures Procedures    Medications Ordered in ED Medications  fentaNYL (SUBLIMAZE) injection 50 mcg (50 mcg Intravenous Given 07/15/16 0413)  ondansetron (ZOFRAN) injection 4 mg (4  mg Intravenous Given 07/15/16 0413)  fentaNYL (SUBLIMAZE) injection 25 mcg (25 mcg Intravenous Given 07/15/16 0540)  sodium chloride 0.9 % bolus 1,000 mL (1,000 mLs Intravenous New Bag/Given 07/15/16 0540)  iopamidol (ISOVUE-300) 61 % injection (100 mLs  Contrast Given 07/15/16 0554)  ketorolac (TORADOL) 30 MG/ML injection 15 mg (15 mg Intravenous Given 07/15/16 0644)     Initial Impression / Assessment and Plan / ED Course  I have reviewed the triage vital signs and the nursing notes.  Pertinent labs  results that were available during my care of the patient were reviewed by me and  considered in my medical decision making (see chart for details).     5:44 AM After monitoring and IV pain meds, pt reports RLQ pain is worsening, she appears uncomfortable and due to body size her exam is very difficult and unable to clinically rule out abdominal emergency will proceed with CT imaging as pelvic exam was unremarkable 6:47 AM Pt now reports she has had frequently but last episode did not last as long CT imaging negative Pt is well appearing/nontoxic Will d/c home Referred to GYN My suspicion for acute abdominal/GYN emergency is low  Final Clinical Impressions(s) / ED Diagnoses   Final diagnoses:  Pelvic pain in female    New Prescriptions New Prescriptions   IBUPROFEN (ADVIL,MOTRIN) 600 MG TABLET    Take 1 tablet (600 mg total) by mouth every 8 (eight) hours as needed.     Zadie Rhine, MD 07/15/16 [phone]

## 2017-03-28 ENCOUNTER — Ambulatory Visit (INDEPENDENT_AMBULATORY_CARE_PROVIDER_SITE_OTHER): Payer: Self-pay | Admitting: Physician Assistant

## 2017-03-28 ENCOUNTER — Encounter (INDEPENDENT_AMBULATORY_CARE_PROVIDER_SITE_OTHER): Payer: Self-pay | Admitting: Physician Assistant

## 2017-03-28 ENCOUNTER — Other Ambulatory Visit: Payer: Self-pay

## 2017-03-28 VITALS — BP 113/69 | HR 76 | Temp 98.0°F | Ht 61.5 in | Wt 261.8 lb

## 2017-03-28 DIAGNOSIS — Z131 Encounter for screening for diabetes mellitus: Secondary | ICD-10-CM

## 2017-03-28 DIAGNOSIS — L309 Dermatitis, unspecified: Secondary | ICD-10-CM

## 2017-03-28 DIAGNOSIS — Z6841 Body Mass Index (BMI) 40.0 and over, adult: Secondary | ICD-10-CM

## 2017-03-28 DIAGNOSIS — R7303 Prediabetes: Secondary | ICD-10-CM

## 2017-03-28 DIAGNOSIS — R1032 Left lower quadrant pain: Secondary | ICD-10-CM

## 2017-03-28 LAB — POCT GLYCOSYLATED HEMOGLOBIN (HGB A1C): Hemoglobin A1C: 5.8

## 2017-03-28 MED ORDER — TRIAMCINOLONE ACETONIDE 0.5 % EX OINT: 1.0000 "application " | TOPICAL_OINTMENT | Freq: Two times a day (BID) | CUTANEOUS | 0 refills | Status: DC

## 2017-03-28 MED ORDER — OMEPRAZOLE 20 MG PO CPDR: 20.0000 mg | DELAYED_RELEASE_CAPSULE | Freq: Every day | ORAL | 3 refills | Status: DC

## 2017-03-28 NOTE — Patient Instructions (Signed)
Prediabetes Prediabetes is the condition of having a blood sugar (blood glucose) level that is higher than it should be, but not high enough for you to be diagnosed with type 2 diabetes. Having prediabetes puts you at risk for developing type 2 diabetes (type 2 diabetes mellitus). Prediabetes may be called impaired glucose tolerance or impaired fasting glucose. Prediabetes usually does not cause symptoms. Your health care provider can diagnose this condition with blood tests. You may be tested for prediabetes if you are overweight and if you have at least one other risk factor for prediabetes. Risk factors for prediabetes include:  Having a family member with type 2 diabetes.  Being overweight or obese.  Being older than age 45.  Being of American-Indian, African-American, Hispanic/Latino, or Asian/Pacific Islander descent.  Having an inactive (sedentary) lifestyle.  Having a history of gestational diabetes or polycystic ovarian syndrome (PCOS).  Having low levels of good cholesterol (HDL-C) or high levels of blood fats (triglycerides).  Having high blood pressure.  What is blood glucose and how is blood glucose measured?  Blood glucose refers to the amount of glucose in your bloodstream. Glucose comes from eating foods that contain sugars and starches (carbohydrates) that the body breaks down into glucose. Your blood glucose level may be measured in mg/dL (milligrams per deciliter) or mmol/L (millimoles per liter).Your blood glucose may be checked with one or more of the following blood tests:  A fasting blood glucose (FBG) test. You will not be allowed to eat (you will fast) for at least 8 hours before a blood sample is taken. ? A normal range for FBG is 70-100 mg/dl (3.9-5.6 mmol/L).  An A1c (hemoglobin A1c) blood test. This test provides information about blood glucose control over the previous 2?3months.  An oral glucose tolerance test (OGTT). This test measures your blood  glucose twice: ? After fasting. This is your baseline level. ? Two hours after you drink a beverage that contains glucose.  You may be diagnosed with prediabetes:  If your FBG is 100?125 mg/dL (5.6-6.9 mmol/L).  If your A1c level is 5.7?6.4%.  If your OGGT result is 140?199 mg/dL (7.8-11 mmol/L).  These blood tests may be repeated to confirm your diagnosis. What happens if blood glucose is too high? The pancreas produces a hormone (insulin) that helps move glucose from the bloodstream into cells. When cells in the body do not respond properly to insulin that the body makes (insulin resistance), excess glucose builds up in the blood instead of going into cells. As a result, high blood glucose (hyperglycemia) can develop, which can cause many complications. This is a symptom of prediabetes. What can happen if blood glucose stays higher than normal for a long time? Having high blood glucose for a long time is dangerous. Too much glucose in your blood can damage your nerves and blood vessels. Long-term damage can lead to complications from diabetes, which may include:  Heart disease.  Stroke.  Blindness.  Kidney disease.  Depression.  Poor circulation in the feet and legs, which could lead to surgical removal (amputation) in severe cases.  How can prediabetes be prevented from turning into type 2 diabetes?  To help prevent type 2 diabetes, take the following actions:  Be physically active. ? Do moderate-intensity physical activity for at least 30 minutes on at least 5 days of the week, or as much as told by your health care provider. This could be brisk walking, biking, or water aerobics. ? Ask your health care provider what   activities are safe for you. A mix of physical activities may be best, such as walking, swimming, cycling, and strength training.  Lose weight as told by your health care provider. ? Losing 5-7% of your body weight can reverse insulin resistance. ? Your health  care provider can determine how much weight loss is best for you and can help you lose weight safely.  Follow a healthy meal plan. This includes eating lean proteins, complex carbohydrates, fresh fruits and vegetables, low-fat dairy products, and healthy fats. ? Follow instructions from your health care provider about eating or drinking restrictions. ? Make an appointment to see a diet and nutrition specialist (registered dietitian) to help you create a healthy eating plan that is right for you.  Do not smoke or use any tobacco products, such as cigarettes, chewing tobacco, and e-cigarettes. If you need help quitting, ask your health care provider.  Take over-the-counter and prescription medicines as told by your health care provider. You may be prescribed medicines that help lower the risk of type 2 diabetes.  This information is not intended to replace advice given to you by your health care provider. Make sure you discuss any questions you have with your health care provider. Document Released: 04/24/2015 Document Revised: 06/08/2015 Document Reviewed: 02/21/2015 Elsevier Interactive Patient Education  2018 [employer]  

## 2017-03-28 NOTE — Progress Notes (Signed)
Subjective:  Patient ID: Kayla Scott, female    DOB: 15-Jul-1975  Age: 42 y.o. MRN: [id number]  CC: abdominal pain  HPI Lanasia Dominguez-Urieta is a 42 y.o. female with a medical history of gestational diabetes  presents as a new patient with concern for epigastric, LLQ abdominal pain, and rectal pain with spicy peppers only. Thinks she may also have a hemorrhoid. She is concerned about the severe pain she experiences in the abdomen because of a family history of colon cancer. Knows spicy foods are the cause of her pain but she can not stop eating spicy foods because "this is our food and we are accustomed to it". Has not tried any anti-acid medications. Tries to relieve epigastric pain with yogurt. Does not endorse any other symptoms or complaints.        Outpatient Medications Prior to Visit  Medication Sig Dispense Refill  . ibuprofen (ADVIL,MOTRIN) 600 MG tablet Take 1 tablet (600 mg total) by mouth every 8 (eight) hours as needed. (Patient not taking: Reported on 03/28/2017) 15 tablet 0  . Prenatal Vit-Fe Fumarate-FA (PRENATAL COMPLETE) 14-0.4 MG TABS Take 1 tablet by mouth daily. (Patient not taking: Reported on 07/15/2016) 60 each 3   No facility-administered medications prior to visit.      ROS Review of Systems  Constitutional: Negative for chills, fever and malaise/fatigue.  Eyes: Negative for blurred vision.  Respiratory: Negative for shortness of breath.   Cardiovascular: Negative for chest pain and palpitations.  Gastrointestinal: Positive for abdominal pain. Negative for blood in stool, constipation, diarrhea, melena, nausea and vomiting.  Genitourinary: Negative for dysuria and hematuria.  Musculoskeletal: Negative for joint pain and myalgias.  Skin: Negative for rash.  Neurological: Negative for tingling and headaches.  Psychiatric/Behavioral: Negative for depression. The patient is not nervous/anxious.     Objective:  BP 113/69 (BP Location: Left  Arm, Patient Position: Sitting, Cuff Size: Large)   Pulse 76   Temp 98 F (36.7 C) (Oral)   Ht 5' 1.5" (1.562 m)   Wt 261 lb 12.8 oz (118.8 kg)   LMP 03/11/2017 (Exact Date)   SpO2 96%   Breastfeeding? No   BMI 48.67 kg/m   BP/Weight 03/28/2017 07/15/2016 12/01/2014  Systolic BP 113 105 133  Diastolic BP 69 71 83  Wt. (Lbs) 261.8 250 254  BMI 48.67 42.91 45.01      Physical Exam  Constitutional: She is oriented to person, place, and time.  Well developed, well nourished, NAD, polite  HENT:  Head: Normocephalic and atraumatic.  Eyes: No scleral icterus.  Neck: Normal range of motion. Neck supple. No thyromegaly present.  Cardiovascular: Normal rate, regular rhythm and normal heart sounds.  Pulmonary/Chest: Effort normal and breath sounds normal. No respiratory distress. She has no wheezes.  Abdominal: Soft. Bowel sounds are normal. She exhibits no distension and no mass. There is no tenderness. There is no rebound and no guarding.  Genitourinary:  Genitourinary Comments: No internal or external hemorrhoid.  Musculoskeletal: She exhibits no edema.  Neurological: She is alert and oriented to person, place, and time. No cranial nerve deficit. Coordination normal.  Skin: Skin is warm and dry. No rash noted. No erythema. No pallor.  Psychiatric: She has a normal mood and affect. Her behavior is normal. Thought content normal.  Vitals reviewed.    Assessment & Plan:    1. Prediabetes - A1c 5.8% today  2. Screening for diabetes mellitus - HgB A1c 5.8% today  3. Dermatitis - triamcinolone ointment (KENALOG)  0.5 %; Apply 1 application topically 2 (two) times daily.  Dispense: 30 g; Refill: 0  4. Left lower quadrant pain - Fecal occult blood, imunochemical - CBC with Differential - Comprehensive metabolic panel - omeprazole (PRILOSEC) 20 MG capsule; Take 1 capsule (20 mg total) by mouth daily.  Dispense: 30 capsule; Refill: 3  5. Class 3 severe obesity with serious  comorbidity and body mass index (BMI) of 45.0 to 49.9 in adult, unspecified obesity type (HCC) - TSH - Lipid panel   Meds ordered this encounter  Medications  . triamcinolone ointment (KENALOG) 0.5 %    Sig: Apply 1 application topically 2 (two) times daily.    Dispense:  30 g    Refill:  0    Order Specific Question:   Supervising Provider    Answer:   Quentin AngstJEGEDE, OLUGBEMIGA E [id number]]  . omeprazole (PRILOSEC) 20 MG capsule    Sig: Take 1 capsule (20 mg total) by mouth daily.    Dispense:  30 capsule    Refill:  3    Order Specific Question:   Supervising Provider    Answer:   Quentin AngstJEGEDE, OLUGBEMIGA E [id number]]    Follow-up: Return in about 4 weeks (around 04/25/2017) for LLQ pain.   Loletta Specteroger David Gomez PA

## 2017-03-29 LAB — COMPREHENSIVE METABOLIC PANEL
A/G RATIO: 1.3 (ref 1.2–2.2)
ALBUMIN: 3.9 g/dL (ref 3.5–5.5)
ALT: 26 IU/L (ref 0–32)
AST: 30 IU/L (ref 0–40)
Alkaline Phosphatase: 78 IU/L (ref 39–117)
BILIRUBIN TOTAL: 0.3 mg/dL (ref 0.0–1.2)
BUN / CREAT RATIO: 16 (ref 9–23)
BUN: 11 mg/dL (ref 6–24)
CALCIUM: 9.2 mg/dL (ref 8.7–10.2)
CHLORIDE: 103 mmol/L (ref 96–106)
CO2: 24 mmol/L (ref 20–29)
Creatinine, Ser: 0.68 mg/dL (ref 0.57–1.00)
GFR, EST AFRICAN AMERICAN: 126 mL/min/{1.73_m2} (ref >59)
GFR, EST NON AFRICAN AMERICAN: 109 mL/min/{1.73_m2} (ref >59)
Globulin, Total: 3.1 g/dL (ref 1.5–4.5)
Glucose: 102 mg/dL — ABNORMAL HIGH (ref 65–99)
POTASSIUM: 4.3 mmol/L (ref 3.5–5.2)
Sodium: 140 mmol/L (ref 134–144)
TOTAL PROTEIN: 7 g/dL (ref 6.0–8.5)

## 2017-03-29 LAB — CBC WITH DIFFERENTIAL/PLATELET
BASOS: 0 %
Basophils Absolute: 0 10*3/uL (ref 0.0–0.2)
EOS (ABSOLUTE): 0.2 10*3/uL (ref 0.0–0.4)
EOS: 3 %
HEMATOCRIT: 41.8 % (ref 34.0–46.6)
HEMOGLOBIN: 13.8 g/dL (ref 11.1–15.9)
IMMATURE GRANULOCYTES: 0 %
Immature Grans (Abs): 0 10*3/uL (ref 0.0–0.1)
LYMPHS: 33 %
Lymphocytes Absolute: 2.4 10*3/uL (ref 0.7–3.1)
MCH: 31.2 pg (ref 26.6–33.0)
MCHC: 33 g/dL (ref 31.5–35.7)
MCV: 95 fL (ref 79–97)
MONOCYTES: 7 %
Monocytes Absolute: 0.5 10*3/uL (ref 0.1–0.9)
NEUTROS ABS: 4.2 10*3/uL (ref 1.4–7.0)
Neutrophils: 57 %
Platelets: 293 10*3/uL (ref 150–379)
RBC: 4.42 x10E6/uL (ref 3.77–5.28)
RDW: 13.6 % (ref 12.3–15.4)
WBC: 7.3 10*3/uL (ref 3.4–10.8)

## 2017-03-29 LAB — TSH: TSH: 1.72 u[IU]/mL (ref 0.450–4.500)

## 2017-03-29 LAB — LIPID PANEL
CHOL/HDL RATIO: 3.1 ratio (ref 0.0–4.4)
Cholesterol, Total: 141 mg/dL (ref 100–199)
HDL: 46 mg/dL (ref >39)
LDL CALC: 77 mg/dL (ref 0–99)
Triglycerides: 90 mg/dL (ref 0–149)
VLDL Cholesterol Cal: 18 mg/dL (ref 5–40)

## 2017-04-01 ENCOUNTER — Telehealth (INDEPENDENT_AMBULATORY_CARE_PROVIDER_SITE_OTHER): Payer: Self-pay

## 2017-04-01 LAB — FECAL OCCULT BLOOD, IMMUNOCHEMICAL: Fecal Occult Bld: NEGATIVE

## 2017-04-01 NOTE — Telephone Encounter (Signed)
Using [phone]) with [employer]; patient made aware of all labs being normal. Kayla Standre Budd PalmerS Chandel Zaun, CMA

## 2017-04-01 NOTE — Telephone Encounter (Signed)
-----   Message from Loletta Specteroger David Gomez, PA-C sent at 03/31/2017  2:41 PM EDT ----- Labs are normal.

## 2017-04-02 ENCOUNTER — Telehealth (INDEPENDENT_AMBULATORY_CARE_PROVIDER_SITE_OTHER): Payer: Self-pay

## 2017-04-02 NOTE — Telephone Encounter (Signed)
Using [phone]) with [employer] patient aware of negative FIT. Maryjean Mornempestt S Jamiel Goncalves, CMA

## 2017-04-02 NOTE — Telephone Encounter (Signed)
-----   Message from Loletta Specteroger David Gomez, PA-C sent at 04/02/2017  9:13 AM EDT ----- Negative FIT. Please notify patient.

## 2017-04-11 ENCOUNTER — Ambulatory Visit: Payer: Medicaid Other

## 2017-04-25 ENCOUNTER — Encounter (INDEPENDENT_AMBULATORY_CARE_PROVIDER_SITE_OTHER): Payer: Self-pay | Admitting: Physician Assistant

## 2017-04-25 ENCOUNTER — Ambulatory Visit (INDEPENDENT_AMBULATORY_CARE_PROVIDER_SITE_OTHER): Payer: Self-pay | Admitting: Physician Assistant

## 2017-04-25 VITALS — BP 109/77 | HR 91 | Temp 98.1°F | Resp 16 | Ht 64.0 in | Wt 260.2 lb

## 2017-04-25 DIAGNOSIS — E66813 Obesity, class 3: Secondary | ICD-10-CM

## 2017-04-25 DIAGNOSIS — Z6841 Body Mass Index (BMI) 40.0 and over, adult: Secondary | ICD-10-CM

## 2017-04-25 DIAGNOSIS — R1013 Epigastric pain: Secondary | ICD-10-CM

## 2017-04-25 MED ORDER — OMEPRAZOLE 40 MG PO CPDR: 40.0000 mg | DELAYED_RELEASE_CAPSULE | Freq: Every day | ORAL | 3 refills | Status: DC

## 2017-04-25 MED ORDER — PHENTERMINE HCL 15 MG PO CAPS: 15.0000 mg | ORAL_CAPSULE | ORAL | 0 refills | Status: DC

## 2017-04-25 MED FILL — OMEPRAZOLE DR 40 MG CAPSULE: 40 | 30 days supply | Qty: 30 | Fill #0

## 2017-04-25 NOTE — Progress Notes (Signed)
Subjective:  Patient ID: Kayla Scott, female    DOB: 12-10-1975  Age: 42 y.o. MRN: [id number]  CC: No chief complaint on file.   HPI Kayla Scott is a 42 y.o. female with a medical history of gestational diabetes and prediabetes presents on f/u of LLQ pain which is reportedly resolved with omeprazole 20mg . Says she has epigastric pain occasionally and is partially resolved with omeprazole 20 mg. Otherwise, she is feeling well and does not endorse any other symptoms or complaints.       Outpatient Medications Prior to Visit  Medication Sig Dispense Refill  . omeprazole (PRILOSEC) 20 MG capsule Take 1 capsule (20 mg total) by mouth daily. 30 capsule 3  . triamcinolone ointment (KENALOG) 0.5 % Apply 1 application topically 2 (two) times daily. 30 g 0   No facility-administered medications prior to visit.      ROS Review of Systems  Constitutional: Negative for chills, fever and malaise/fatigue.  Eyes: Negative for blurred vision.  Respiratory: Negative for shortness of breath.   Cardiovascular: Negative for chest pain and palpitations.  Gastrointestinal: Positive for abdominal pain. Negative for blood in stool, constipation, diarrhea, melena, nausea and vomiting.  Genitourinary: Negative for dysuria and hematuria.  Musculoskeletal: Negative for joint pain and myalgias.  Skin: Negative for rash.  Neurological: Negative for tingling and headaches.  Psychiatric/Behavioral: Negative for depression. The patient is not nervous/anxious.     Objective:  There were no vitals taken for this visit.  BP/Weight 03/28/2017 07/15/2016 12/01/2014  Systolic BP 113 105 133  Diastolic BP 69 71 83  Wt. (Lbs) 261.8 250 254  BMI 48.67 42.91 45.01      Physical Exam  Constitutional: She is oriented to person, place, and time. No distress.  obese  HENT:  Head: Normocephalic and atraumatic.  Eyes: Conjunctivae are normal. No scleral icterus.  Neck: No thyromegaly  present.  Cardiovascular: Normal rate, regular rhythm and normal heart sounds.  Pulmonary/Chest: Effort normal and breath sounds normal.  Abdominal: Soft. Bowel sounds are normal. There is no tenderness.  Musculoskeletal: She exhibits no edema.  Neurological: She is alert and oriented to person, place, and time.  Skin: Skin is warm and dry. No rash noted.     Assessment & Plan:    1. Abdominal pain, epigastric - Increase omeprazole (PRILOSEC) 40 MG capsule; Take 1 capsule (40 mg total) by mouth daily.  Dispense: 30 capsule; Refill: 3  2. Class 3 severe obesity without serious comorbidity with body mass index (BMI) of 45.0 to 49.9 in adult, unspecified obesity type (HCC) - Begin phentermine 15 MG capsule; Take 1 capsule (15 mg total) by mouth every morning.  Dispense: 30 capsule; Refill: 0   Meds ordered this encounter  Medications  . DISCONTD: omeprazole (PRILOSEC) 40 MG capsule    Sig: Take 1 capsule (40 mg total) by mouth daily.    Dispense:  30 capsule    Refill:  3    Order Specific Question:   Supervising Provider    Answer:   Quentin AngstJEGEDE, OLUGBEMIGA E [id number]]  . phentermine 15 MG capsule    Sig: Take 1 capsule (15 mg total) by mouth every morning.    Dispense:  30 capsule    Refill:  0    Order Specific Question:   Supervising Provider    Answer:   Quentin AngstJEGEDE, OLUGBEMIGA E [id number]]  . omeprazole (PRILOSEC) 40 MG capsule    Sig: Take 1 capsule (40 mg total) by mouth daily.  Dispense:  30 capsule    Refill:  3    Order Specific Question:   Supervising Provider    Answer:   Quentin Angst [[id number]]    Follow-up: Return in about 1 month (around 05/23/2017), or if symptoms worsen or fail to improve, for pap smear and obesity.   [name] PA

## 2017-04-25 NOTE — Patient Instructions (Signed)
Dolor abdominal en los adultos °Abdominal Pain, Adult °El dolor de estómago (abdominal) puede tener muchas causas. La mayoría de las veces, el dolor de estómago no es peligroso. Muchos de estos casos de dolor de estómago pueden controlarse y tratarse en casa. Sin embargo, a veces, el dolor de estómago puede ser grave. El médico intentará descubrir la causa del dolor de estómago. °Siga estas indicaciones en su casa: °· Tome los medicamentos de venta libre y los recetados solamente como se lo haya indicado el médico. No tome medicamentos que lo ayuden a defecar (laxantes), salvo que el médico se lo indique. °· Beba suficiente líquido para mantener el pis (orina) claro o de color amarillo pálido. °· Esté atento al dolor de estómago para detectar cualquier cambio. °· Concurra a todas las visitas de control como se lo haya indicado el médico. Esto es importante. °Comuníquese con un médico si: °· El dolor de estómago cambia o empeora. °· No tiene apetito o baja de peso sin proponérselo. °· Tiene dificultades para defecar (está estreñido) o heces líquidas (diarrea) durante más de 2 o 3 días. °· Siente dolor al orinar o defecar. °· El dolor de estómago lo despierta de noche. °· El dolor empeora con las comidas, después de comer o con determinados alimentos. °· Vomita y no puede retener nada de lo que ingiere. °· Tiene fiebre. °Solicite ayuda de inmediato si: °· El dolor no desaparece en el tiempo indicado por el médico. °· No puede detener los vómitos. °· Siente dolor solamente en zonas específicas del abdomen, como el lado derecho o la parte inferior izquierda. °· Tiene heces con sangre, de color negro o con aspecto alquitranado. °· Tiene dolor muy intenso en el vientre, cólicos o meteorismo. °· Presenta signos de no tener suficientes líquidos o agua en el cuerpo (deshidratación), por ejemplo: °? La orina es oscura, es muy escasa o no orina. °? Labios agrietados. °? Boca seca. °? Ojos  hundidos. °? Somnolencia. °? Debilidad. °Esta información no tiene como fin reemplazar el consejo del médico. Asegúrese de hacerle al médico cualquier pregunta que tenga. °Document Released: 03/29/2008 Document Revised: 03/27/2016 Document Reviewed: 06/14/2015 °Elsevier Interactive Patient Education © 2018 [employer] Inc[employer] ° °

## 2017-05-20 ENCOUNTER — Telehealth (INDEPENDENT_AMBULATORY_CARE_PROVIDER_SITE_OTHER): Payer: Self-pay | Admitting: Physician Assistant

## 2017-05-20 NOTE — Telephone Encounter (Signed)
Pt came in to request an update on her Bills that keep arriving, The amount of $291.00 and $617.56 on the account number [id number], a copy was forwarded to [facility] CAFA eligibility:(04/11/2017-10/12/2017) Bill Date:(03/28/2017),(03/29/2017) Please follow up

## 2017-05-26 ENCOUNTER — Encounter (INDEPENDENT_AMBULATORY_CARE_PROVIDER_SITE_OTHER): Payer: Self-pay | Admitting: Nurse Practitioner

## 2017-05-26 ENCOUNTER — Ambulatory Visit (INDEPENDENT_AMBULATORY_CARE_PROVIDER_SITE_OTHER): Payer: Self-pay | Admitting: Nurse Practitioner

## 2017-05-26 ENCOUNTER — Other Ambulatory Visit (HOSPITAL_COMMUNITY)
Admission: RE | Admit: 2017-05-26 | Discharge: 2017-05-26 | Disposition: A | Discharge status: Home / Self Care | Payer: Self-pay | Source: Ambulatory Visit | Attending: Nurse Practitioner | Admitting: Nurse Practitioner

## 2017-05-26 ENCOUNTER — Other Ambulatory Visit: Payer: Self-pay

## 2017-05-26 VITALS — BP 112/80 | HR 87 | Temp 98.2°F | Wt 258.0 lb

## 2017-05-26 DIAGNOSIS — Z6841 Body Mass Index (BMI) 40.0 and over, adult: Secondary | ICD-10-CM

## 2017-05-26 DIAGNOSIS — Z01419 Encounter for gynecological examination (general) (routine) without abnormal findings: Secondary | ICD-10-CM | POA: Insufficient documentation

## 2017-05-26 NOTE — Progress Notes (Signed)
Assessment & Plan:  Diagnoses and all orders for this visit:  Encounter for cervical Pap smear with pelvic exam -     Cytology - PAP  Morbid obesity with BMI of 40.0-44.9, adult (HCC) Take phentermine as prescribed.   Patient has been counseled on age-appropriate routine health concerns for screening and prevention. These are reviewed and up-to-date. Referrals have been placed accordingly. Immunizations are up-to-date or declined.    Subjective:   Chief Complaint  Patient presents with  . Gynecologic Exam   HPI Kayla Scott 42 y.o. female presents to office today for PAP smear. She has not started her phentermine but reports she will start this week.   Review of Systems  Constitutional: Negative.  Negative for chills, fever, malaise/fatigue and weight loss.  Respiratory: Negative.  Negative for cough, shortness of breath and wheezing.   Cardiovascular: Negative.  Negative for chest pain, orthopnea and leg swelling.  Gastrointestinal: Negative for abdominal pain.  Genitourinary: Negative.  Negative for dysuria, flank pain, frequency, hematuria and urgency.  Skin: Negative.  Negative for rash.  Psychiatric/Behavioral: Negative for suicidal ideas.    Past Medical History:  Diagnosis Date  . Diabetes mellitus without complication (HCC) 2016  . Gestational diabetes   . UTI (lower urinary tract infection)     Past Surgical History:  Procedure Laterality Date  . CESAREAN SECTION    . CESAREAN SECTION WITH BILATERAL TUBAL LIGATION Bilateral 10/18/2014   Procedure: REPEAT CESAREAN SECTION WITH BILATERAL TUBAL LIGATION;  Surgeon: Adam Phenix, MD;  Location: [facility];  Service: Obstetrics;  Laterality: Bilateral;    Family History  Problem Relation Age of Onset  . Diabetes Mother   . Cancer Paternal Grandmother        uterine    Social History Reviewed with no changes to be made today.   Outpatient Medications Prior to Visit  Medication Sig Dispense Refill   . omeprazole (PRILOSEC) 40 MG capsule Take 1 capsule (40 mg total) by mouth daily. 30 capsule 3  . triamcinolone ointment (KENALOG) 0.5 % Apply 1 application topically 2 (two) times daily. 30 g 0  . phentermine 15 MG capsule Take 1 capsule (15 mg total) by mouth every morning. (Patient not taking: Reported on 05/26/2017) 30 capsule 0   No facility-administered medications prior to visit.     Allergies  Allergen Reactions  . Metformin And Related Shortness Of Breath    Felt like throat was swelling       Objective:    BP 112/80 (BP Location: Left Arm, Patient Position: Sitting, Cuff Size: Large)   Pulse 87   Temp 98.2 F (36.8 C) (Oral)   Wt 258 lb (117 kg)   LMP 05/12/2017 (Approximate)   SpO2 98%   BMI 44.29 kg/m  Wt Readings from Last 3 Encounters:  05/26/17 258 lb (117 kg)  04/25/17 260 lb 3.2 oz (118 kg)  03/28/17 261 lb 12.8 oz (118.8 kg)    Physical Exam  Constitutional: She is oriented to person, place, and time. She appears well-developed and well-nourished.  HENT:  Head: Normocephalic.  Cardiovascular: Normal rate, regular rhythm and normal heart sounds.  Pulmonary/Chest: Effort normal and breath sounds normal.  Abdominal: Soft. Bowel sounds are normal. Hernia confirmed negative in the right inguinal area and confirmed negative in the left inguinal area.  Genitourinary: Rectum normal, vagina normal and uterus normal. Rectal exam shows no external hemorrhoid. No labial fusion. There is no rash, tenderness, lesion or injury on the right  labia. There is no rash, tenderness, lesion or injury on the left labia. Uterus is not deviated and not enlarged. Cervix exhibits no motion tenderness and no friability. Right adnexum displays no mass, no tenderness and no fullness. Left adnexum displays no mass, no tenderness and no fullness. No erythema, tenderness or bleeding in the vagina. No foreign body in the vagina. No signs of injury around the vagina. No vaginal discharge found.   Lymphadenopathy: No inguinal adenopathy noted on the right or left side.       Right: No inguinal adenopathy present.       Left: No inguinal adenopathy present.  Neurological: She is alert and oriented to person, place, and time.  Skin: Skin is warm and dry.  Psychiatric: She has a normal mood and affect. Her behavior is normal. Judgment and thought content normal.         Patient has been counseled extensively about nutrition and exercise as well as the importance of adherence with medications and regular follow-up. The patient was given clear instructions to go to ER or return to medical center if symptoms don't improve, worsen or new problems develop. The patient verbalized understanding.   Follow-up: Return if symptoms worsen or fail to improve.   Claiborne Rigg, FNP-BC [facility] [place], [place] [phone]   05/26/2017, 3:12 PM

## 2017-05-28 LAB — CYTOLOGY - PAP
BACTERIAL VAGINITIS: NEGATIVE
Candida vaginitis: NEGATIVE
Chlamydia: NEGATIVE
Diagnosis: NEGATIVE
NEISSERIA GONORRHEA: NEGATIVE
TRICH (WINDOWPATH): NEGATIVE

## 2017-05-30 ENCOUNTER — Telehealth (INDEPENDENT_AMBULATORY_CARE_PROVIDER_SITE_OTHER): Payer: Self-pay

## 2017-05-30 NOTE — Telephone Encounter (Signed)
-----   Message from Claiborne Rigg, NP sent at 05/29/2017 11:56 PM EDT ----- Pap smear is negative for cervical cancer. Other labs are negative for chlamydia, gonorrhea, trichomonas, yeast or bacteria.

## 2017-05-30 NOTE — Telephone Encounter (Signed)
Called patient using [employer] [phone]) and informed her that the pap is negative for cervical cancer. And other labs are negative for chlamydia, gonorrhea, trichomonas, yeast and bacteria. Maryjean Morn, CMA

## 2017-06-23 ENCOUNTER — Encounter (INDEPENDENT_AMBULATORY_CARE_PROVIDER_SITE_OTHER): Payer: Self-pay | Admitting: Physician Assistant

## 2017-06-23 ENCOUNTER — Ambulatory Visit (INDEPENDENT_AMBULATORY_CARE_PROVIDER_SITE_OTHER): Payer: Self-pay | Admitting: Physician Assistant

## 2017-06-23 VITALS — BP 115/77 | HR 80 | Temp 97.8°F | Resp 18 | Ht 62.0 in | Wt 254.0 lb

## 2017-06-23 DIAGNOSIS — Z6841 Body Mass Index (BMI) 40.0 and over, adult: Secondary | ICD-10-CM

## 2017-06-23 DIAGNOSIS — I83891 Varicose veins of right lower extremities with other complications: Secondary | ICD-10-CM

## 2017-06-23 MED ORDER — PHENTERMINE HCL 30 MG PO CAPS: 30.0000 mg | ORAL_CAPSULE | ORAL | 0 refills | Status: DC

## 2017-06-23 NOTE — Progress Notes (Signed)
Subjective:  Patient ID: Kayla Scott, female    DOB: [birth year]  Age: 42 y.o. MRN: [id number]  CC: f/u obesity.   HPI Kayla Scott is a 42 y.o. female with a medical history of gestational diabetes and prediabetes presents on f/u of obesity. Weighed 260 lbs two months ago. Prescribed phentermine 15 mg x30 days. Weighs 254 lbs today. States she had an ankle inversion injury to the right ankle which stopped her from exercising. She is now healed and ready to exercise again. Would like to restart phentermine and is motivated to lose weight.. Stopped drinking coffee and soda. Feels well generally with occasional pain in the varicosities when she walks. Reports no side effect with previous phentermine use.      Outpatient Medications Prior to Visit  Medication Sig Dispense Refill  . omeprazole (PRILOSEC) 40 MG capsule Take 1 capsule (40 mg total) by mouth daily. 30 capsule 3  . phentermine 15 MG capsule Take 1 capsule (15 mg total) by mouth every morning. 30 capsule 0  . triamcinolone ointment (KENALOG) 0.5 % Apply 1 application topically 2 (two) times daily. (Patient not taking: Reported on 06/23/2017) 30 g 0   No facility-administered medications prior to visit.      ROS Review of Systems  Constitutional: Negative for chills, fever and malaise/fatigue.  Eyes: Negative for blurred vision.  Respiratory: Negative for shortness of breath.   Cardiovascular: Negative for chest pain and palpitations.       Varicose veins  Gastrointestinal: Negative for abdominal pain and nausea.  Genitourinary: Negative for dysuria and hematuria.  Musculoskeletal: Negative for joint pain and myalgias.  Skin: Negative for rash.  Neurological: Negative for tingling and headaches.  Psychiatric/Behavioral: Negative for depression. The patient is not nervous/anxious.     Objective:  BP 115/77 (BP Location: Left Arm, Patient Position: Sitting, Cuff Size: Large)   Pulse 80   Temp  97.8 F (36.6 C) (Oral)   Resp 18   Ht 5\' 2"  (1.575 m)   Wt 254 lb (115.2 kg)   LMP 06/23/2017   SpO2 100%   BMI 46.46 kg/m   BP/Weight 06/23/2017 05/26/2017 04/25/2017  Systolic BP 115 112 109  Diastolic BP 77 80 77  Wt. (Lbs) 254 258 260.2  BMI 46.46 44.29 44.66      Physical Exam  Constitutional: She is oriented to person, place, and time.  Well developed, obese, NAD, polite  HENT:  Head: Normocephalic and atraumatic.  Eyes: No scleral icterus.  Neck: Normal range of motion. Neck supple. No thyromegaly present.  Cardiovascular: Normal rate, regular rhythm, normal heart sounds and intact distal pulses. Exam reveals no gallop and no friction rub.  No murmur heard. Varicose veins of the right calf. No carotid bruit bilaterally.  Pulmonary/Chest: Effort normal and breath sounds normal.  Abdominal: Soft. Bowel sounds are normal. There is no tenderness.  Musculoskeletal: She exhibits no edema.  Neurological: She is alert and oriented to person, place, and time.  Skin: Skin is warm and dry. No rash noted. No erythema. No pallor.  Psychiatric: She has a normal mood and affect. Her behavior is normal. Thought content normal.  Vitals reviewed.    Assessment & Plan:   1. Class 3 severe obesity with body mass index (BMI) of 45.0 to 49.9 in adult, unspecified obesity type, unspecified whether serious comorbidity present (HCC) - Restart Phentermine  2. 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 phentermine 30 MG capsule    Sig: Take 1 capsule (30 mg total) by mouth every morning.    Dispense:  30 capsule    Refill:  0    Order Specific Question:   Supervising Provider    Answer:   Hoy Register [[id number]]    Follow-up: Return in about 1 month (around 07/21/2017) for obesity.   [name] PA

## 2017-06-23 NOTE — Patient Instructions (Signed)
Venas varicosas  (Varicose Veins)  Las venas varicosas son venas que se han agrandado y tornado sinuosas. Suelen aparecer en las piernas, pero también pueden verse en otra parte del cuerpo.  CAUSAS  Esta afección se presenta como consecuencia del mal funcionamiento de las válvulas de las venas, las cuales ayudan al retorno de la sangre desde las piernas hacia el corazón. Si estas válvulas se dañan, la sangre retrocede y regresa a las venas de la pierna, cerca de la superficie de la piel, lo que causa dilatación venosa.  FACTORES DE RIESGO  Las personas que están mucho tiempo paradas, las embarazadas o las personas con sobrepeso tienen más probabilidades de tener venas varicosas.  SIGNOS Y SÍNTOMAS  · Venas abultadas, azuladas y de aspecto sinuoso que se observan con mayor frecuencia en las piernas.  · Dolor o sensación de pesadez en las piernas. Estos síntomas pueden empeorar al final del día.  · Hinchazón de las piernas.  · Cambios en el color de la piel.    DIAGNÓSTICO  Generalmente, el médico puede diagnosticar las venas varicosas al examinarle las piernas. Además, puede recomendarle que se haga una ecografía de las venas de las piernas.  TRATAMIENTO  La mayoría de las venas varicosas pueden tratarse en casa. Sin embargo, hay otros tratamientos a disposición de las personas que tienen síntomas persistentes o desean mejorar la apariencia estética de las venas varicosas. Estas opciones de tratamiento incluyen lo siguiente:  · Escleroterapia. Se inyecta una solución en la vena para anularla.  · Tratamiento con láser. Se usa un rayo láser para calentar la vena y anularla.  · Ablación venosa por radiofrecuencia Se usa una corriente eléctrica que se produce mediante ondas de radio para anular la vena.  · Flebectomía. Se extirpa quirúrgicamente la vena a través de pequeñas incisiones que se hacen sobre la vena varicosa.  · Ligadura venosa y varicectomía. La vena se extirpa quirúrgicamente a través de incisiones que se  realizan sobre la vena varicosa después de haberla anudado (ligado).  INSTRUCCIONES PARA EL CUIDADO EN EL HOGAR  · No permanezca sentado o de pie en una posición durante mucho tiempo. No se siente con las piernas cruzadas. Descanse con las piernas elevadas durante el día.  · Use medias de compresión como le haya indicado su médico. Estas medias ayudan a evitar la formación de coágulos sanguíneos y a reducir la hinchazón de las piernas.  · No use otras prendas que le ajusten todo el contorno de las piernas, la pelvis o la cintura.  · Camine todo lo posible para aumentar la circulación de la sangre.  · A la noche, eleve el pie de la cama con bloques de 2 pulgadas.  · Si tiene un corte en la piel sobre la vena y la vena sangra, recuéstese con la pierna elevada y ejerza presión en el lugar con un paño limpio, hasta que deje de sangrar. Luego aplique un apósito (vendaje) sobre el corte. Consulte al médico si el sangrado continúa.    SOLICITE ATENCIÓN MÉDICA SI:  · La piel alrededor del tobillo empieza a agrietarse.  · Siente dolor, hay enrojecimiento, sensibilidad o hinchazón dura en la pierna sobre una vena.  · Está incómodo debido al dolor de la pierna.    Esta información no tiene como fin reemplazar el consejo del médico. Asegúrese de hacerle al médico cualquier pregunta que tenga.  Document Released: 10/10/2004 Document Revised: 04/24/2015 Document Reviewed: 07/04/2015  Elsevier Interactive Patient Education © 2017 Elsevier Inc.

## 2017-06-27 ENCOUNTER — Ambulatory Visit (INDEPENDENT_AMBULATORY_CARE_PROVIDER_SITE_OTHER): Payer: Self-pay | Admitting: Physician Assistant

## 2017-07-23 ENCOUNTER — Ambulatory Visit (INDEPENDENT_AMBULATORY_CARE_PROVIDER_SITE_OTHER): Payer: Self-pay | Admitting: Physician Assistant

## 2017-07-30 ENCOUNTER — Encounter (HOSPITAL_COMMUNITY): Payer: Self-pay

## 2017-07-30 ENCOUNTER — Emergency Department (HOSPITAL_COMMUNITY)
Admission: EM | Admit: 2017-07-30 | Discharge: 2017-07-31 | Disposition: A | Discharge status: Home / Self Care | Payer: Self-pay | Attending: Emergency Medicine | Admitting: Emergency Medicine

## 2017-07-30 DIAGNOSIS — M25551 Pain in right hip: Secondary | ICD-10-CM | POA: Insufficient documentation

## 2017-07-30 DIAGNOSIS — M62838 Other muscle spasm: Secondary | ICD-10-CM | POA: Insufficient documentation

## 2017-07-30 DIAGNOSIS — E119 Type 2 diabetes mellitus without complications: Secondary | ICD-10-CM | POA: Insufficient documentation

## 2017-07-30 DIAGNOSIS — Z79899 Other long term (current) drug therapy: Secondary | ICD-10-CM | POA: Insufficient documentation

## 2017-07-30 LAB — URINALYSIS, ROUTINE W REFLEX MICROSCOPIC
BILIRUBIN URINE: NEGATIVE
Glucose, UA: NEGATIVE mg/dL
KETONES UR: NEGATIVE mg/dL
Leukocytes, UA: NEGATIVE
Nitrite: NEGATIVE
Protein, ur: NEGATIVE mg/dL
SPECIFIC GRAVITY, URINE: 1.012 (ref 1.005–1.030)
pH: 6 (ref 5.0–8.0)

## 2017-07-30 LAB — COMPREHENSIVE METABOLIC PANEL
ALBUMIN: 3.2 g/dL — AB (ref 3.5–5.0)
ALT: 19 U/L (ref 0–44)
ANION GAP: 6 (ref 5–15)
AST: 21 U/L (ref 15–41)
Alkaline Phosphatase: 77 U/L (ref 38–126)
BUN: 14 mg/dL (ref 6–20)
CALCIUM: 8.9 mg/dL (ref 8.9–10.3)
CO2: 26 mmol/L (ref 22–32)
Chloride: 107 mmol/L (ref 98–111)
Creatinine, Ser: 0.85 mg/dL (ref 0.44–1.00)
GFR calc non Af Amer: >60 mL/min (ref >60)
Glucose, Bld: 136 mg/dL — ABNORMAL HIGH (ref 70–99)
POTASSIUM: 4.3 mmol/L (ref 3.5–5.1)
SODIUM: 139 mmol/L (ref 135–145)
Total Bilirubin: 0.4 mg/dL (ref 0.3–1.2)
Total Protein: 7 g/dL (ref 6.5–8.1)

## 2017-07-30 LAB — CBC
HEMATOCRIT: 40.2 % (ref 36.0–46.0)
HEMOGLOBIN: 13 g/dL (ref 12.0–15.0)
MCH: 31.1 pg (ref 26.0–34.0)
MCHC: 32.3 g/dL (ref 30.0–36.0)
MCV: 96.2 fL (ref 78.0–100.0)
Platelets: 277 10*3/uL (ref 150–400)
RBC: 4.18 MIL/uL (ref 3.87–5.11)
RDW: 12.9 % (ref 11.5–15.5)
WBC: 8.8 10*3/uL (ref 4.0–10.5)

## 2017-07-30 LAB — I-STAT BETA HCG BLOOD, ED (MC, WL, AP ONLY)

## 2017-07-30 LAB — LIPASE, BLOOD: LIPASE: 39 U/L (ref 11–51)

## 2017-07-30 NOTE — ED Notes (Signed)
Results reviewed, no changes in acuity at this time 

## 2017-07-30 NOTE — ED Triage Notes (Signed)
Pt is spanish speaking only, since Sunday has been having RLQ pain with diarrhea, denies n/v/dysuria/fevers

## 2017-07-31 ENCOUNTER — Other Ambulatory Visit: Payer: Self-pay

## 2017-07-31 ENCOUNTER — Emergency Department (HOSPITAL_COMMUNITY): Payer: Self-pay

## 2017-07-31 MED ORDER — CYCLOBENZAPRINE HCL 10 MG PO TABS: 10.0000 mg | ORAL_TABLET | Freq: Every day | ORAL | 0 refills | Status: AC

## 2017-07-31 MED ORDER — KETOROLAC TROMETHAMINE 60 MG/2ML IM SOLN
30.0000 mg | Freq: Once | INTRAMUSCULAR | Status: AC
  Administered 2017-07-31: 30 mg via INTRAMUSCULAR
  Filled 2017-07-31: qty 2

## 2017-07-31 NOTE — ED Provider Notes (Signed)
[name] Provider Note  CSN: [id number] Arrival date & time: 07/30/17 1905  Chief Complaint(s) Hip Pain  Triage note mentioned RLQ, but patient denies abdominal pain.   HPI Kayla Scott is a 42 y.o. female here with 4 days of right hip pain that began while lying in bed. Aching/cramping pain. Constant, but fluctuating. Worse with ROM and palpation on the upper thigh musculature. Improves with motrin, but pain returns a few hours later. No trauma, fever, chills, lower leg pain or swelling. No rash or erythema.   HPI  Past Medical History Past Medical History:  Diagnosis Date  . Diabetes mellitus without complication (HCC) 2016  . Gestational diabetes   . UTI (lower urinary tract infection)    Patient Active Problem List   Diagnosis Date Noted  . Prediabetes 03/28/2017  . Dermatitis 03/28/2017  . Left lower quadrant pain 03/28/2017  . Class 3 severe obesity with serious comorbidity and body mass index (BMI) of 45.0 to 49.9 in adult (HCC) 03/28/2017  . S/P C-section 10/18/2014   Home Medication(s) Prior to Admission medications   Medication Sig Start Date End Date Taking? Authorizing Provider  cyclobenzaprine (FLEXERIL) 10 MG tablet Take 1 tablet (10 mg total) by mouth at bedtime for 10 days. 07/31/17 08/10/17  Nira Connardama,  Eduardo, MD  omeprazole (PRILOSEC) 40 MG capsule Take 1 capsule (40 mg total) by mouth daily. 04/25/17   Loletta SpecterGomez, Roger David, PA-C  phentermine 30 MG capsule Take 1 capsule (30 mg total) by mouth every morning. 06/23/17   Loletta SpecterGomez, Roger David, PA-C  triamcinolone ointment (KENALOG) 0.5 % Apply 1 application topically 2 (two) times daily. Patient not taking: Reported on 06/23/2017 03/28/17   Denny LevyGomez, Roger David, PA-C                                                                                                                                    Past Surgical History Past Surgical History:  Procedure Laterality  Date  . CESAREAN SECTION    . CESAREAN SECTION WITH BILATERAL TUBAL LIGATION Bilateral 10/18/2014   Procedure: REPEAT CESAREAN SECTION WITH BILATERAL TUBAL LIGATION;  Surgeon: Adam PhenixJames G Arnold, MD;  Location: [facility];  Service: Obstetrics;  Laterality: Bilateral;   Family History Family History  Problem Relation Age of Onset  . Diabetes Mother   . Cancer Paternal Grandmother        uterine    Social History Social History   Tobacco Use  . Smoking status: Never Smoker  . Smokeless tobacco: Never Used  Substance Use Topics  . Alcohol use: No  . Drug use: No   Allergies Metformin and related  Review of Systems Review of Systems All other systems are reviewed and are negative for acute change except as noted in the HPI  Physical Exam Vital Signs  I have reviewed the triage vital signs BP 108/73   Pulse 79   Temp  98 F (36.7 C) (Oral)   Resp 16   LMP 07/29/2017 Comment: neg preg test  SpO2 95%   Physical Exam  Constitutional: She is oriented to person, place, and time. She appears well-developed and well-nourished. No distress.  HENT:  Head: Normocephalic and atraumatic.  Right Ear: External ear normal.  Left Ear: External ear normal.  Nose: Nose normal.  Eyes: Conjunctivae and EOM are normal. No scleral icterus.  Neck: Normal range of motion and phonation normal.  Cardiovascular: Normal rate and regular rhythm.  Pulses:      Dorsalis pedis pulses are 2+ on the right side, and 2+ on the left side.  Pulmonary/Chest: Effort normal. No stridor. No respiratory distress.  Abdominal: She exhibits no distension. There is no tenderness. There is no rigidity, no rebound and no guarding. No hernia.  Musculoskeletal: Normal range of motion. She exhibits no edema.       Right hip: She exhibits tenderness.       Legs: Neurological: She is alert and oriented to person, place, and time.  Skin: She is not diaphoretic.  Psychiatric: She has a normal mood and affect. Her behavior is  normal.  Vitals reviewed.   ED Results and Treatments Labs (all labs ordered are listed, but only abnormal results are displayed) Labs Reviewed  COMPREHENSIVE METABOLIC PANEL - Abnormal; Notable for the following components:      Result Value   Glucose, Bld 136 (*)    Albumin 3.2 (*)    All other components within normal limits  URINALYSIS, ROUTINE W REFLEX MICROSCOPIC - Abnormal; Notable for the following components:   Color, Urine STRAW (*)    Hgb urine dipstick SMALL (*)    Bacteria, UA RARE (*)    All other components within normal limits  LIPASE, BLOOD  CBC  I-STAT BETA HCG BLOOD, ED ([facility], [facility], [facility] ONLY)                                                                                                                         EKG  EKG Interpretation  Date/Time:    Ventricular Rate:    PR Interval:    QRS Duration:   QT Interval:    QTC Calculation:   R Axis:     Text Interpretation:        Radiology Dg Hip Unilat W Or Wo Pelvis 2-3 Views Right  Result Date: 07/31/2017 CLINICAL DATA:  42 year old female with right hip pain. No known injury. EXAM: DG HIP (WITH OR WITHOUT PELVIS) 2-3V RIGHT COMPARISON:  CT of the abdomen pelvis dated 07/15/2016 FINDINGS: There is no acute fracture or dislocation. Mild arthritic changes of the hips. Bilateral tubal ligation clips noted. The soft tissues appear unremarkable. IMPRESSION: No acute fracture or dislocation. Electronically Signed   By: Elgie Collard M.D.   On: 07/31/2017 02:53   Pertinent labs & imaging results that were available during my care of the patient were reviewed by me and considered in  my medical decision making (see chart for details).  Medications Ordered in ED Medications  ketorolac (TORADOL) injection 30 mg (30 mg Intramuscular Given 07/31/17 0250)                                                                                                                                    Procedures Procedures  (including  critical care time)  Medical Decision Making / ED Course I have reviewed the nursing notes for this encounter and the patient's prior records (if available in EHR or on provided paperwork).    Triage labs reassuring. No acute abdominal process suspected.  Right pain likely muscle spasm. Labs w/o abnormal potasium or calcium. No trauma. Plain film w/o fracture, dislocation, or avascular necrosis. Doubt DVT. No infectious process.  Pain improved with toradol.  The patient appears reasonably screened and/or stabilized for discharge and I doubt any other medical condition or other [facility] requiring further screening, evaluation, or treatment in the ED at this time prior to discharge.  The patient is safe for discharge with strict return precautions.   Final Clinical Impression(s) / ED Diagnoses Final diagnoses:  Right hip pain  Muscle spasm   Disposition: Discharge  Condition: Good  I have discussed the results, Dx and Tx plan with the patient who expressed understanding and agree(s) with the plan. Discharge instructions discussed at great length. The patient was given strict return precautions who verbalized understanding of the instructions. No further questions at time of discharge.    ED Discharge Orders        Ordered    cyclobenzaprine (FLEXERIL) 10 MG tablet  Daily at bedtime     07/31/17 0559       Follow Up: [name], PA-C [address] [place] [address] [phone]  Schedule an appointment as soon as possible for a visit  in 5-7 days, If symptoms do not improve or  worsen      This chart was dictated using voice recognition software.  Despite best efforts to proofread,  errors can occur which can change the documentation meaning.   Nira Conn, MD 07/31/17 2043

## 2017-07-31 NOTE — ED Notes (Signed)
Patient transported to X-ray 

## 2017-07-31 NOTE — ED Notes (Signed)
Pt departed in NAD, refused use of wheelchair.  

## 2017-07-31 NOTE — Discharge Instructions (Signed)
Puede tomar Motrin (Ibuprofen) o Aleve (Naproxen), Acetaminophen (Tylenol), crema para los musculos como SalonPas, Icy Hot, Bengay, etc. Puede estrechar, ponerce hielo o comprecion de calor, o que le den masaje. ° °

## 2017-08-06 ENCOUNTER — Encounter (INDEPENDENT_AMBULATORY_CARE_PROVIDER_SITE_OTHER): Payer: Self-pay | Admitting: Physician Assistant

## 2017-08-06 ENCOUNTER — Other Ambulatory Visit: Payer: Self-pay

## 2017-08-06 ENCOUNTER — Ambulatory Visit (INDEPENDENT_AMBULATORY_CARE_PROVIDER_SITE_OTHER): Payer: Self-pay | Admitting: Physician Assistant

## 2017-08-06 VITALS — BP 117/82 | HR 80 | Temp 98.0°F | Ht 62.0 in | Wt 251.0 lb

## 2017-08-06 DIAGNOSIS — Z9109 Other allergy status, other than to drugs and biological substances: Secondary | ICD-10-CM

## 2017-08-06 DIAGNOSIS — Z6841 Body Mass Index (BMI) 40.0 and over, adult: Secondary | ICD-10-CM

## 2017-08-06 DIAGNOSIS — E66813 Obesity, class 3: Secondary | ICD-10-CM

## 2017-08-06 MED ORDER — PHENTERMINE HCL 30 MG PO CAPS: 30.0000 mg | ORAL_CAPSULE | ORAL | 0 refills | Status: DC

## 2017-08-06 MED ORDER — CETIRIZINE HCL 10 MG PO TABS: 10.0000 mg | ORAL_TABLET | Freq: Every day | ORAL | 11 refills | Status: DC

## 2017-08-06 MED ORDER — FLUTICASONE PROPIONATE 50 MCG/ACT NA SUSP: 2.0000 | Freq: Every day | NASAL | 6 refills | Status: DC

## 2017-08-06 MED FILL — FLUTICASONE PROP 50 MCG SPR: 50 | 30 days supply | Qty: 16 | Fill #0

## 2017-08-06 MED FILL — ?CETIRIZINE HCL 10 MG TABLE: 10 | 30 days supply | Qty: 30 | Fill #0

## 2017-08-06 NOTE — Patient Instructions (Signed)
Phentermine tablets or capsules Qu es este medicamento? La FENTERMINA reduce su apetito. Si la combina con una dieta de bajas caloras y ejercio puede ayudarle a bajar de peso. Este medicamento puede ser utilizado para otros usos; si tiene alguna pregunta consulte con su proveedor de atencin mdica o con su farmacutico. MARCAS COMUNES: Adipex-P, Atti-Plex P, Atti-Plex P Spansule, Fastin, Lomaira, Pro-Fast, Tara-8 Qu le debo informar a mi profesional de la salud antes de tomar este medicamento? Necesita saber si usted presenta alguno de los siguientes problemas o situaciones: -agitacin -glaucoma -enfermedad cardiaca -alta presin sangunea -antecedentes de abuso de substancias -enfermedad pulmonar llamada Hipertensin Pulmonar Primaria -si ha tomado un IMAO, tales como Carbex, Eldepryl, Marplan, Nardil o Parnate en los ltimos 14 das -enfermedad tiroidea -una reaccin alrgica o inusual a la fentermina, a otros medicamentos, alimentos, colorantes o conservantes -si est embarazada o buscando quedar embarazada -si est amamantando a un beb Cmo debo utilizar este medicamento? Tome este medicamento por va oral con un vaso de agua. Siga las instrucciones de la etiqueta del medicamento. Las instrucciones de uso pueden variar segn el producto y la dosis que est tomando. Evite tomar este medicamento por la tarde antes de acostarse a dormir. Puede interferir con la conciliacin del sueo. Tome sus dosis a intervalos regulares. No tome su medicamento con una frecuencia mayor a la indicada. Hable con su pediatra para informarse acerca del uso de este medicamento en nios. Aunque este medicamento se puede recetar a nios a partir de los 17 aos de edad en casos selectos, existen precauciones que deben cumplirse. Sobredosis: Pngase en contacto inmediatamente con un centro toxicolgico o una sala de urgencia si usted cree que haya tomado demasiado medicamento. ATENCIN: Este medicamento es  solo para usted. No comparta este medicamento con nadie. Qu sucede si me olvido de una dosis? Si olvida una dosis, tmela lo antes posible. Si es casi la hora de la prxima dosis, tome slo esa dosis. No tome dosis adicionales o dobles. Qu puede interactuar con este medicamento? No tome esta medicina con ninguno de los siguientes medicamentos: -duloxetina -IMAOs, tales como Carbex, Eldepryl, Marplan, Nardil y Parnate -medicamentos para resfros o problemas respiratorios, tales como seudoefedrina o fenilefrina -procarbazina -sibutramine -ISRS, tales como citalopram, escitalopram, fluoxetina, fluvoxamina, paroxetina y sertralina -estimulantes, tales como dexmetilfenidato, metilfenidato o modafinil -venlafaxina Esta medicina tambin puede interactuar con los siguientes medicamentos: -medicamentos para la diabetes Puede ser que esta lista no menciona todas las posibles interacciones. Informe a su profesional de la salud de todos los productos a base de hierbas, medicamentos de venta libre o suplementos nutritivos que est tomando. Si usted fuma, consume bebidas alcohlicas o si utiliza drogas ilegales, indqueselo tambin a su profesional de la salud. Algunas sustancias pueden interactuar con su medicamento. A qu debo estar atento al usar este medicamento? Informe a su mdico de inmediato si siente que le falta el aliento mientras realiza sus actividades habituales. No tome este medicamento dentro de 6 horas de la hora de acostarse. Puede impedirle dormir. Evite las bebidas que contienen cafena y trata de mantener un horario de acostarse habitual cada noche. Este medicamento fue diseado para ser utilizado en combinacin con una dieta saludable y ejercicio. De esta manera se obtienen los mejores resultados. Este medicamento slo est indicado para uso a corto plazo. Con el tiempo, su peso puede estabilizarse. A partir de ese momento, el medicamento slo le ayudar a mantener su nuevo peso. No  aumente ni modifique su dosis de ninguna manera sin   consultar a su mdico. Puede experimentar mareos o somnolencia. No conduzca ni utilice maquinaria ni haga nada que le exija permanecer en estado de alerta hasta que sepa cmo le afecta este medicamento. No se siente ni se ponga de pie con rapidez, especialmente si es un paciente de edad avanzada. Esto reduce el riesgo de mareos o desmayos. El alcohol puede aumentar los mareos y la somnolencia. Evite consumir bebidas alcohlicas. Qu efectos secundarios puedo tener al utilizar este medicamento? Efectos secundarios que debe informar a su mdico o a su profesional de la salud tan pronto como sea posible: -dolor en el pecho, palpitaciones -depresin o cambios severos de estados de nimo -aumento de la presin sangunea -irritabilidad -nerviosismo o inquietud -mareos severos -falta de aliento -problemas para orinar -hinchazn inusual de las piernas -vmito Efectos secundarios que, por lo general, no requieren atencin mdica (debe informarlos a su mdico o a su profesional de la salud si persisten o si son molestos): -visin borrosa u otros problemas de ojo -cambios en la capacidad o el deseo sexual -estreimiento o diarrea -dificultad para conciliar el sueo -boca seca o sabor desagradable -dolor de cabeza -nuseas Puede ser que esta lista no menciona todos los posibles efectos secundarios. Comunquese a su mdico por asesoramiento mdico sobre los efectos secundarios. Usted puede informar los efectos secundarios a la FDA por telfono al 1-800-FDA-1088. Dnde debo guardar mi medicina? Mantenga fuera del alcance de los nios. Existe la posibilidad de abusar de este medicamento. Mantenga su medicamento en un lugar seguro para protegerlo contra robos. No comparta este medicamento con nadie. Es peligroso vender o regalar este medicamento, y est prohibido por la ley. Este medicamento puede causar una sobredosis accidental y la muerte si lo toman  otros adultos, nios o mascotas. Mezcle todo el medicamento sin usar con una sustancia como piedras sanitarias para gatos o posos (residuos) de caf. Luego deseche el medicamento en un recipiente cerrado, como una bolsa sellada o una lata de caf con tapa. No utilice este medicamento despus de la fecha de vencimiento. Guarde a temperatura ambiente, entre 20 y 25 grados Celsius (68 y 77 grados Fahrenheit). Mantenga el recipiente bien cerrado. ATENCIN: Este folleto es un resumen. Puede ser que no cubra toda la posible informacin. Si usted tiene preguntas acerca de esta medicina, consulte con su mdico, su farmacutico o su profesional de la salud.  2018 Elsevier/Gold Standard (2016-02-01 00:00:00)  

## 2017-08-06 NOTE — Progress Notes (Signed)
Subjective:  Patient ID: Kayla Scott, female    DOB: 19-Dec-1975  Age: 42 y.o. MRN: [id number]  CC: f/u obesity  HPI Kayla Scott is a 42 y.o. female with a medical history of gestational diabetes and prediabetes presents on f/u of obesity. Weighed 260 lbs three months ago. Had phentermine restarted last month after not having taken phentermine for one month. Did not take  phentermine 30 mg that was prescribed last month due to family visiting for one month. Did not regularly exercise because of the visitation from family. She is trying to watch her carb intake. Weighed 254 lbs last month. Weighs 251 lbs today. Feels she is finally ready to exercise and diet. Does not endorse any symptoms or complaints.      Outpatient Medications Prior to Visit  Medication Sig Dispense Refill  . cyclobenzaprine (FLEXERIL) 10 MG tablet Take 1 tablet (10 mg total) by mouth at bedtime for 10 days. 10 tablet 0  . omeprazole (PRILOSEC) 40 MG capsule Take 1 capsule (40 mg total) by mouth daily. 30 capsule 3  . phentermine 30 MG capsule Take 1 capsule (30 mg total) by mouth every morning. 30 capsule 0  . triamcinolone ointment (KENALOG) 0.5 % Apply 1 application topically 2 (two) times daily. (Patient not taking: Reported on 06/23/2017) 30 g 0   No facility-administered medications prior to visit.      ROS Review of Systems  Constitutional: Negative for chills, fever and malaise/fatigue.  Eyes: Negative for blurred vision.  Respiratory: Negative for shortness of breath.   Cardiovascular: Negative for chest pain and palpitations.  Gastrointestinal: Negative for abdominal pain and nausea.  Genitourinary: Negative for dysuria and hematuria.  Musculoskeletal: Negative for joint pain and myalgias.  Skin: Negative for rash.  Neurological: Negative for tingling and headaches.  Psychiatric/Behavioral: Negative for depression. The patient is not nervous/anxious.     Objective:  BP  117/82 (BP Location: Left Arm, Patient Position: Sitting, Cuff Size: Large)   Pulse 80   Temp 98 F (36.7 C) (Oral)   Ht 5\' 2"  (1.575 m)   Wt 251 lb (113.9 kg)   LMP 07/29/2017 Comment: neg preg test  SpO2 99%   BMI 45.91 kg/m   BP/Weight 08/06/2017 07/31/2017 06/23/2017  Systolic BP 117 108 115  Diastolic BP 82 73 77  Wt. (Lbs) 251 - 254  BMI 45.91 - 46.46      Physical Exam  Constitutional: She is oriented to person, place, and time.  Well developed, well nourished, NAD, polite  HENT:  Head: Normocephalic and atraumatic.  Eyes: No scleral icterus.  Neck: Normal range of motion. Neck supple. No thyromegaly present.  Cardiovascular: Normal rate, regular rhythm and normal heart sounds.  Pulmonary/Chest: Effort normal and breath sounds normal.  Musculoskeletal: She exhibits no edema.  Neurological: She is alert and oriented to person, place, and time.  Skin: Skin is warm and dry. No rash noted. No erythema. No pallor.  Psychiatric: She has a normal mood and affect. Her behavior is normal. Thought content normal.  Vitals reviewed.    Assessment & Plan:    1. Class 3 severe obesity without serious comorbidity with body mass index (BMI) of 45.0 to 49.9 in adult, unspecified obesity type (HCC) - phentermine 30 MG capsule; Take 1 capsule (30 mg total) by mouth every morning.  Dispense: 30 capsule; Refill: 0  2. Environmental allergies - cetirizine (ZYRTEC) 10 MG tablet; Take 1 tablet (10 mg total) by mouth daily.  Dispense: 30  tablet; Refill: 11 - fluticasone (FLONASE) 50 MCG/ACT nasal spray; Place 2 sprays into both nostrils daily.  Dispense: 16 g; Refill: 6   Meds ordered this encounter  Medications  . phentermine 30 MG capsule    Sig: Take 1 capsule (30 mg total) by mouth every morning.    Dispense:  30 capsule    Refill:  0    Order Specific Question:   Supervising Provider    Answer:   Hoy RegisterNEWLIN, ENOBONG [[id number]]  . cetirizine (ZYRTEC) 10 MG tablet    Sig: Take 1 tablet  (10 mg total) by mouth daily.    Dispense:  30 tablet    Refill:  11    Order Specific Question:   Supervising Provider    Answer:   Hoy RegisterNEWLIN, ENOBONG [[id number]]  . fluticasone (FLONASE) 50 MCG/ACT nasal spray    Sig: Place 2 sprays into both nostrils daily.    Dispense:  16 g    Refill:  6    Order Specific Question:   Supervising Provider    Answer:   Hoy RegisterNEWLIN, ENOBONG [[id number]]    Follow-up: Return in about 1 month (around 09/03/2017) for obesity.[name].   Kayla Scott David Venus Ruhe PA

## 2017-09-01 ENCOUNTER — Ambulatory Visit (INDEPENDENT_AMBULATORY_CARE_PROVIDER_SITE_OTHER): Payer: Self-pay | Admitting: Physician Assistant

## 2017-09-24 MED FILL — ?CETIRIZINE HCL 10 MG TABLE: 10 | 30 days supply | Qty: 30 | Fill #1

## 2017-11-17 ENCOUNTER — Ambulatory Visit: Payer: Self-pay | Attending: Physician Assistant

## 2017-11-17 ENCOUNTER — Ambulatory Visit (INDEPENDENT_AMBULATORY_CARE_PROVIDER_SITE_OTHER): Payer: Self-pay | Admitting: Physician Assistant

## 2017-12-05 ENCOUNTER — Ambulatory Visit: Payer: Self-pay | Attending: Family Medicine

## 2017-12-08 ENCOUNTER — Ambulatory Visit (INDEPENDENT_AMBULATORY_CARE_PROVIDER_SITE_OTHER): Payer: Self-pay | Admitting: Physician Assistant

## 2017-12-08 ENCOUNTER — Encounter (INDEPENDENT_AMBULATORY_CARE_PROVIDER_SITE_OTHER): Payer: Self-pay | Admitting: Physician Assistant

## 2017-12-08 VITALS — BP 110/75 | HR 93 | Temp 98.5°F | Resp 18 | Ht 60.0 in | Wt 261.0 lb

## 2017-12-08 DIAGNOSIS — R7303 Prediabetes: Secondary | ICD-10-CM

## 2017-12-08 DIAGNOSIS — Z6841 Body Mass Index (BMI) 40.0 and over, adult: Secondary | ICD-10-CM

## 2017-12-08 LAB — POCT GLYCOSYLATED HEMOGLOBIN (HGB A1C): HEMOGLOBIN A1C: 5.5 % (ref 4.0–5.6)

## 2017-12-08 MED ORDER — PHENTERMINE HCL 30 MG PO CAPS: 30.0000 mg | ORAL_CAPSULE | ORAL | 0 refills | Status: DC

## 2017-12-08 NOTE — Progress Notes (Signed)
Subjective:  Patient ID: Kayla Scott, female    DOB: 10-Mar-1975  Age: 42 y.o. MRN: [id number]  CC: f/u obesity  HPI Kayla Scott is a 42 y.o. female with a medical history of gestational diabetes and prediabetes presents to f/u on obesity. Pt has been inconsistent about taking phentermine and about following up for appointments. Last seen 08/06/17 (four months ago). Weight at her last visit was 251 lbs. Her weight today is 261 lbs. Cites stress as a trigger to want to eat. Otherwise, she reports feeling well and has no symptoms. Work up thus far revealed a normal/unremarkable TSH, Lipid panel, CBC, and CMP.         Outpatient Medications Prior to Visit  Medication Sig Dispense Refill  . cetirizine (ZYRTEC) 10 MG tablet Take 1 tablet (10 mg total) by mouth daily. 30 tablet 11  . fluticasone (FLONASE) 50 MCG/ACT nasal spray Place 2 sprays into both nostrils daily. 16 g 6  . omeprazole (PRILOSEC) 40 MG capsule Take 1 capsule (40 mg total) by mouth daily. 30 capsule 3  . phentermine 30 MG capsule Take 1 capsule (30 mg total) by mouth every morning. 30 capsule 0  . triamcinolone ointment (KENALOG) 0.5 % Apply 1 application topically 2 (two) times daily. (Patient not taking: Reported on 06/23/2017) 30 g 0   No facility-administered medications prior to visit.      ROS Review of Systems  Constitutional: Negative for chills, fever and malaise/fatigue.  Eyes: Negative for blurred vision.  Respiratory: Negative for shortness of breath.   Cardiovascular: Negative for chest pain and palpitations.  Gastrointestinal: Negative for abdominal pain and nausea.  Genitourinary: Negative for dysuria and hematuria.  Musculoskeletal: Negative for joint pain and myalgias.  Skin: Negative for rash.  Neurological: Negative for tingling and headaches.  Psychiatric/Behavioral: Negative for depression. The patient is not nervous/anxious.     Objective:  Resp 18   Ht 5' (1.524  m)   Wt 238 lb (108 kg)   LMP 11/22/2017   BMI 46.48 kg/m   Vitals:   12/08/17 1412  BP: 110/75  Pulse: 93  Resp: 18  Temp: 98.5 F (36.9 C)  SpO2: 100%      Physical Exam  Constitutional: She is oriented to person, place, and time.  Well developed, obese, NAD, polite  HENT:  Head: Normocephalic and atraumatic.  Eyes: No scleral icterus.  Neck: Normal range of motion. Neck supple. No thyromegaly present.  Cardiovascular: Normal rate, regular rhythm and normal heart sounds.  No LE edema bilaterally  Pulmonary/Chest: Effort normal and breath sounds normal.  Musculoskeletal: She exhibits no edema.  Neurological: She is alert and oriented to person, place, and time.  Skin: Skin is warm and dry. No rash noted. No erythema. No pallor.  Psychiatric: She has a normal mood and affect. Her behavior is normal. Thought content normal.  Vitals reviewed.    Assessment & Plan:     1. Prediabetes - HgB A1c 5.5%, down from 5.8% eight months ago.   2. Class 3 severe obesity with body mass index (BMI) of 45.0 to 49.9 in adult, unspecified obesity type, unspecified whether serious comorbidity present (HCC) - BMI 46.48 - Lipid panel; Future - Basic Metabolic Panel; Future - Begin phentermine 30 MG capsule; Take 1 capsule (30 mg total) by mouth every morning.  Dispense: 30 capsule; Refill: 0 - I have advised patient to seek evaluation for bariatric surgery but patient would like to find some consistency in her life  and try to keep her follow up appointments and take her phentermine for three consistent months before considering bariatric surgery.   Meds ordered this encounter  Medications  . phentermine 30 MG capsule    Sig: Take 1 capsule (30 mg total) by mouth every morning.    Dispense:  30 capsule    Refill:  0    Order Specific Question:   Supervising Provider    Answer:   Hoy Register [[id number]]    Follow-up: Return in about 4 weeks (around 01/05/2018) for obesity.    [name] PA

## 2017-12-08 NOTE — Patient Instructions (Signed)
Prediabetes Prediabetes is the condition of having a blood sugar (blood glucose) level that is higher than it should be, but not high enough for you to be diagnosed with type 2 diabetes. Having prediabetes puts you at risk for developing type 2 diabetes (type 2 diabetes mellitus). Prediabetes may be called impaired glucose tolerance or impaired fasting glucose. Prediabetes usually does not cause symptoms. Your health care provider can diagnose this condition with blood tests. You may be tested for prediabetes if you are overweight and if you have at least one other risk factor for prediabetes. Risk factors for prediabetes include:  Having a family member with type 2 diabetes.  Being overweight or obese.  Being older than age 45.  Being of American-Indian, African-American, Hispanic/Latino, or Asian/Pacific Islander descent.  Having an inactive (sedentary) lifestyle.  Having a history of gestational diabetes or polycystic ovarian syndrome (PCOS).  Having low levels of good cholesterol (HDL-C) or high levels of blood fats (triglycerides).  Having high blood pressure.  What is blood glucose and how is blood glucose measured?  Blood glucose refers to the amount of glucose in your bloodstream. Glucose comes from eating foods that contain sugars and starches (carbohydrates) that the body breaks down into glucose. Your blood glucose level may be measured in mg/dL (milligrams per deciliter) or mmol/L (millimoles per liter).Your blood glucose may be checked with one or more of the following blood tests:  A fasting blood glucose (FBG) test. You will not be allowed to eat (you will fast) for at least 8 hours before a blood sample is taken. ? A normal range for FBG is 70-100 mg/dl (3.9-5.6 mmol/L).  An A1c (hemoglobin A1c) blood test. This test provides information about blood glucose control over the previous 2?3months.  An oral glucose tolerance test (OGTT). This test measures your blood  glucose twice: ? After fasting. This is your baseline level. ? Two hours after you drink a beverage that contains glucose.  You may be diagnosed with prediabetes:  If your FBG is 100?125 mg/dL (5.6-6.9 mmol/L).  If your A1c level is 5.7?6.4%.  If your OGGT result is 140?199 mg/dL (7.8-11 mmol/L).  These blood tests may be repeated to confirm your diagnosis. What happens if blood glucose is too high? The pancreas produces a hormone (insulin) that helps move glucose from the bloodstream into cells. When cells in the body do not respond properly to insulin that the body makes (insulin resistance), excess glucose builds up in the blood instead of going into cells. As a result, high blood glucose (hyperglycemia) can develop, which can cause many complications. This is a symptom of prediabetes. What can happen if blood glucose stays higher than normal for a long time? Having high blood glucose for a long time is dangerous. Too much glucose in your blood can damage your nerves and blood vessels. Long-term damage can lead to complications from diabetes, which may include:  Heart disease.  Stroke.  Blindness.  Kidney disease.  Depression.  Poor circulation in the feet and legs, which could lead to surgical removal (amputation) in severe cases.  How can prediabetes be prevented from turning into type 2 diabetes?  To help prevent type 2 diabetes, take the following actions:  Be physically active. ? Do moderate-intensity physical activity for at least 30 minutes on at least 5 days of the week, or as much as told by your health care provider. This could be brisk walking, biking, or water aerobics. ? Ask your health care provider what   activities are safe for you. A mix of physical activities may be best, such as walking, swimming, cycling, and strength training.  Lose weight as told by your health care provider. ? Losing 5-7% of your body weight can reverse insulin resistance. ? Your health  care provider can determine how much weight loss is best for you and can help you lose weight safely.  Follow a healthy meal plan. This includes eating lean proteins, complex carbohydrates, fresh fruits and vegetables, low-fat dairy products, and healthy fats. ? Follow instructions from your health care provider about eating or drinking restrictions. ? Make an appointment to see a diet and nutrition specialist (registered dietitian) to help you create a healthy eating plan that is right for you.  Do not smoke or use any tobacco products, such as cigarettes, chewing tobacco, and e-cigarettes. If you need help quitting, ask your health care provider.  Take over-the-counter and prescription medicines as told by your health care provider. You may be prescribed medicines that help lower the risk of type 2 diabetes.  This information is not intended to replace advice given to you by your health care provider. Make sure you discuss any questions you have with your health care provider. Document Released: 04/24/2015 Document Revised: 06/08/2015 Document Reviewed: 02/21/2015 Elsevier Interactive Patient Education  2018 [employer]  

## 2017-12-11 ENCOUNTER — Emergency Department (HOSPITAL_COMMUNITY)
Admission: EM | Admit: 2017-12-11 | Discharge: 2017-12-11 | Disposition: A | Discharge status: Home / Self Care | Payer: Self-pay | Attending: Emergency Medicine | Admitting: Emergency Medicine

## 2017-12-11 ENCOUNTER — Emergency Department (HOSPITAL_COMMUNITY): Payer: Self-pay

## 2017-12-11 ENCOUNTER — Encounter (HOSPITAL_COMMUNITY): Payer: Self-pay

## 2017-12-11 DIAGNOSIS — Y929 Unspecified place or not applicable: Secondary | ICD-10-CM | POA: Insufficient documentation

## 2017-12-11 DIAGNOSIS — M542 Cervicalgia: Secondary | ICD-10-CM | POA: Insufficient documentation

## 2017-12-11 DIAGNOSIS — Z79899 Other long term (current) drug therapy: Secondary | ICD-10-CM | POA: Insufficient documentation

## 2017-12-11 DIAGNOSIS — S46812A Strain of other muscles, fascia and tendons at shoulder and upper arm level, left arm, initial encounter: Secondary | ICD-10-CM | POA: Insufficient documentation

## 2017-12-11 DIAGNOSIS — Y999 Unspecified external cause status: Secondary | ICD-10-CM | POA: Insufficient documentation

## 2017-12-11 DIAGNOSIS — Y939 Activity, unspecified: Secondary | ICD-10-CM | POA: Insufficient documentation

## 2017-12-11 DIAGNOSIS — X58XXXA Exposure to other specified factors, initial encounter: Secondary | ICD-10-CM | POA: Insufficient documentation

## 2017-12-11 MED ORDER — MELOXICAM 7.5 MG PO TABS: 7.5000 mg | ORAL_TABLET | Freq: Every day | ORAL | 0 refills | Status: DC

## 2017-12-11 MED ORDER — CYCLOBENZAPRINE HCL 10 MG PO TABS
5.0000 mg | ORAL_TABLET | Freq: Once | ORAL | Status: AC
  Administered 2017-12-11: 5 mg via ORAL
  Filled 2017-12-11: qty 1

## 2017-12-11 MED ORDER — OXYCODONE-ACETAMINOPHEN 5-325 MG PO TABS
2.0000 | ORAL_TABLET | Freq: Once | ORAL | Status: AC
  Administered 2017-12-11: 2 via ORAL
  Filled 2017-12-11: qty 2

## 2017-12-11 MED ORDER — CYCLOBENZAPRINE HCL 10 MG PO TABS: 10.0000 mg | ORAL_TABLET | Freq: Two times a day (BID) | ORAL | 0 refills | Status: DC | PRN

## 2017-12-11 NOTE — ED Triage Notes (Addendum)
C/o chest pain and neck pain that started on Monday- denies any nausea/vomiting-  Using interpretor for assessment.  States that she saw her doctor on Monday and felt fine, p[ain started after that. Pain on palpation over neck area. Has taken advil last night at 10pm.  Also c/o swelling over eyes- had a hx of facial numbness in march- "eyes moving all around" now.

## 2017-12-11 NOTE — ED Notes (Signed)
Discharge instructions reviewed with pt by Redmond BasemanHayden, RN. Pt wheeled to lobby. Pt denied any further questions.

## 2017-12-11 NOTE — ED Provider Notes (Signed)
[name] Provider Note   CSN: [id number] Arrival date & time: 12/11/17  [id number]     History   Chief Complaint Chief Complaint  Patient presents with  . Neck Pain  . Chest Pain    HPI Bufford ButtnerJosefina Dominguez-Urieta is a 42 y.o. female. 5 caveat secondary to language barrier history obtained through translator HPI 42 year old female history of type 2 diabetes presents today complaining of neck pain.  She states that she began having some pain in the left lateral aspect of her neck on Monday.  She denies any injury.  States the pain radiates around down to the left shoulder blade and into her left shoulder.  It is worse with movement.  She denies numbness, tingling, or weakness.  She has not had fever or chills.  She denies any similar symptoms in the past.  She has been taking ibuprofen without relief.  Pain is 6-7 out of 10.  It worsens with movement of her neck to the left. Past Medical History:  Diagnosis Date  . Diabetes mellitus without complication (HCC) 2016  . Gestational diabetes   . UTI (lower urinary tract infection)     Patient Active Problem List   Diagnosis Date Noted  . Prediabetes 03/28/2017  . Dermatitis 03/28/2017  . Left lower quadrant pain 03/28/2017  . Class 3 severe obesity with serious comorbidity and body mass index (BMI) of 45.0 to 49.9 in adult (HCC) 03/28/2017  . S/P C-section 10/18/2014    Past Surgical History:  Procedure Laterality Date  . CESAREAN SECTION    . CESAREAN SECTION WITH BILATERAL TUBAL LIGATION Bilateral 10/18/2014   Procedure: REPEAT CESAREAN SECTION WITH BILATERAL TUBAL LIGATION;  Surgeon: Adam PhenixJames G Arnold, MD;  Location: [facility];  Service: Obstetrics;  Laterality: Bilateral;     OB History    Gravida  8   Para  5   Term  4   Preterm  1   AB  3   Living  5     SAB  3   TAB      Ectopic      Multiple  0   Live Births  5            Home Medications    Prior to Admission  medications   Medication Sig Start Date End Date Taking? Authorizing Provider  cetirizine (ZYRTEC) 10 MG tablet Take 1 tablet (10 mg total) by mouth daily. 08/06/17   Loletta SpecterGomez, Roger David, PA-C  fluticasone [facility](FLONASE) 50 MCG/ACT nasal spray Place 2 sprays into both nostrils daily. 08/06/17   Loletta SpecterGomez, Roger David, PA-C  omeprazole (PRILOSEC) 40 MG capsule Take 1 capsule (40 mg total) by mouth daily. 04/25/17   Loletta SpecterGomez, Roger David, PA-C  phentermine 30 MG capsule Take 1 capsule (30 mg total) by mouth every morning. 12/08/17   Loletta SpecterGomez, Roger David, PA-C  triamcinolone ointment (KENALOG) 0.5 % Apply 1 application topically 2 (two) times daily. Patient not taking: Reported on 06/23/2017 03/28/17   Loletta SpecterGomez, Roger David, PA-C    Family History Family History  Problem Relation Age of Onset  . Diabetes Mother   . Cancer Paternal Grandmother        uterine    Social History Social History   Tobacco Use  . Smoking status: Never Smoker  . Smokeless tobacco: Never Used  Substance Use Topics  . Alcohol use: No  . Drug use: No     Allergies   Metformin and related  Review of Systems Review of Systems  All other systems reviewed and are negative.    Physical Exam Updated Vital Signs BP (!) 126/57 (BP Location: Right Arm)   Pulse 84   Resp 20   LMP 11/22/2017   SpO2 100%   Physical Exam  Constitutional: She is oriented to person, place, and time. She appears well-developed and well-nourished.  Non-toxic appearance.  HENT:  Head: Normocephalic and atraumatic.  Eyes: Pupils are equal, round, and reactive to light. EOM are normal.  Neck: Normal range of motion. Neck supple. No JVD present. No tracheal deviation present. No thyromegaly present.  Cardiovascular: Normal rate, regular rhythm and normal pulses.  Pulmonary/Chest: Effort normal and breath sounds normal.  Abdominal: Soft. Bowel sounds are normal.  Musculoskeletal: Normal range of motion.       Right lower leg: Normal.       Left  lower leg: Normal.  Tenderness to palpation of her left trapezius muscle and left peri-vertebral cervical area No point tenderness over cervical, thoracic, or lumbar spine  Neurological: She is alert and oriented to person, place, and time. She is not disoriented. No cranial nerve deficit.  Skin: Skin is warm and dry. Capillary refill takes less than 2 seconds.  Psychiatric: She has a normal mood and affect. Her behavior is normal.  Nursing note and vitals reviewed.    ED Treatments / Results  Labs (all labs ordered are listed, but only abnormal results are displayed) Labs Reviewed - No data to display  EKG None  Radiology No results found.  Procedures Procedures (including critical care time)  Medications Ordered in ED Medications  cyclobenzaprine (FLEXERIL) tablet 5 mg (5 mg Oral Given 12/11/17 0947)  oxyCODONE-acetaminophen (PERCOCET/ROXICET) 5-325 MG per tablet 2 tablet (2 tablets Oral Given 12/11/17 0947)     Initial Impression / Assessment and Plan / ED Course  I have reviewed the triage vital signs and the nursing notes.  Pertinent labs & imaging results that were available during my care of the patient were reviewed by me and considered in my medical decision making (see chart for details).     No fracture seen on x-Torri Langston. Patient without s/s of infection or trauma Normal neuro exam doubt disc herniation or other cord compression etiology. Plan smr and continued antiinflammatory Discussed return precautions and need for follow up. Vitals:   12/11/17 0932  BP: (!) 126/57  Pulse: 84  Resp: 20  SpO2: 100%      Final Clinical Impressions(s) / ED Diagnoses   Final diagnoses:  Neck pain  Trapezius muscle strain, left, initial encounter    ED Discharge Orders    None       Margarita Grizzle, MD 12/11/17 1102

## 2017-12-17 ENCOUNTER — Other Ambulatory Visit (INDEPENDENT_AMBULATORY_CARE_PROVIDER_SITE_OTHER): Payer: Self-pay

## 2017-12-17 DIAGNOSIS — Z6841 Body Mass Index (BMI) 40.0 and over, adult: Principal | ICD-10-CM

## 2017-12-17 NOTE — Progress Notes (Signed)
Lab visit

## 2017-12-18 LAB — BASIC METABOLIC PANEL
BUN / CREAT RATIO: 21 (ref 9–23)
BUN: 13 mg/dL (ref 6–24)
CO2: 24 mmol/L (ref 20–29)
Calcium: 9.1 mg/dL (ref 8.7–10.2)
Chloride: 101 mmol/L (ref 96–106)
Creatinine, Ser: 0.63 mg/dL (ref 0.57–1.00)
GFR calc Af Amer: 129 mL/min/{1.73_m2} (ref >59)
GFR calc non Af Amer: 112 mL/min/{1.73_m2} (ref >59)
Glucose: 102 mg/dL — ABNORMAL HIGH (ref 65–99)
Potassium: 4.4 mmol/L (ref 3.5–5.2)
Sodium: 139 mmol/L (ref 134–144)

## 2017-12-18 LAB — LIPID PANEL
Chol/HDL Ratio: 3.5 ratio (ref 0.0–4.4)
Cholesterol, Total: 165 mg/dL (ref 100–199)
HDL: 47 mg/dL (ref >39)
LDL Calculated: 94 mg/dL (ref 0–99)
Triglycerides: 119 mg/dL (ref 0–149)
VLDL CHOLESTEROL CAL: 24 mg/dL (ref 5–40)

## 2017-12-19 ENCOUNTER — Telehealth (INDEPENDENT_AMBULATORY_CARE_PROVIDER_SITE_OTHER): Payer: Self-pay

## 2017-12-19 NOTE — Telephone Encounter (Signed)
-----   Message from Loletta Specteroger David Gomez, PA-C sent at 12/18/2017 11:34 AM EST ----- Normal labs.

## 2017-12-19 NOTE — Telephone Encounter (Signed)
Call placed using pacific interpreter [phone]) patient is aware that labs are normal. Maryjean Mornempestt S Roberts, CMA

## 2017-12-30 ENCOUNTER — Inpatient Hospital Stay (INDEPENDENT_AMBULATORY_CARE_PROVIDER_SITE_OTHER): Payer: Self-pay | Admitting: Physician Assistant

## 2018-01-09 ENCOUNTER — Ambulatory Visit (INDEPENDENT_AMBULATORY_CARE_PROVIDER_SITE_OTHER): Payer: Self-pay | Admitting: Physician Assistant

## 2018-01-09 ENCOUNTER — Encounter (INDEPENDENT_AMBULATORY_CARE_PROVIDER_SITE_OTHER): Payer: Self-pay | Admitting: Physician Assistant

## 2018-01-09 DIAGNOSIS — Z6841 Body Mass Index (BMI) 40.0 and over, adult: Secondary | ICD-10-CM

## 2018-01-09 DIAGNOSIS — Z9119 Patient's noncompliance with other medical treatment and regimen: Secondary | ICD-10-CM

## 2018-01-09 DIAGNOSIS — Z91199 Patient's noncompliance with other medical treatment and regimen due to unspecified reason: Secondary | ICD-10-CM

## 2018-01-09 NOTE — Progress Notes (Signed)
Subjective:  Patient ID: Kayla Scott, female    DOB: 07-11-1975  Age: 42 y.o. MRN: [id number]  CC: obesity  HPI Kayla Scott is a 42 y.o. female with a medical history of gestational diabetes and prediabetes presents for f/u of obesity. BMI today 50.62. Weight at her last visit was 261 lbs. Her weight today is 259 lbs.Pt has been inconsistent about taking phentermine and about following up for appointments. Has not taken her phentermine prescribed last month. Cites stress as a trigger to want to eat. Continues to have issues with a rebellious and law breaking son. Feels her sons activities may be putting her family in danger. Son is 41 years old. Otherwise, she reports feeling well and has no symptoms. Work up thus far revealed a normal/unremarkable TSH, Lipid panel, CBC, and CMP.    Outpatient Medications Prior to Visit  Medication Sig Dispense Refill  . cetirizine (ZYRTEC) 10 MG tablet Take 1 tablet (10 mg total) by mouth daily. (Patient not taking: Reported on 01/09/2018) 30 tablet 11  . cyclobenzaprine (FLEXERIL) 10 MG tablet Take 1 tablet (10 mg total) by mouth 2 (two) times daily as needed for muscle spasms. (Patient not taking: Reported on 01/09/2018) 20 tablet 0  . fluticasone (FLONASE) 50 MCG/ACT nasal spray Place 2 sprays into both nostrils daily. (Patient not taking: Reported on 12/11/2017) 16 g 6  . meloxicam (MOBIC) 7.5 MG tablet Take 1 tablet (7.5 mg total) by mouth daily. (Patient not taking: Reported on 01/09/2018) 10 tablet 0  . omeprazole (PRILOSEC) 40 MG capsule Take 1 capsule (40 mg total) by mouth daily. (Patient not taking: Reported on 12/11/2017) 30 capsule 3  . phentermine 30 MG capsule Take 1 capsule (30 mg total) by mouth every morning. (Patient not taking: Reported on 12/11/2017) 30 capsule 0  . triamcinolone ointment (KENALOG) 0.5 % Apply 1 application topically 2 (two) times daily. (Patient not taking: Reported on 06/23/2017) 30 g 0   No  facility-administered medications prior to visit.      ROS Review of Systems  Constitutional: Negative for chills, fever and malaise/fatigue.  Eyes: Negative for blurred vision.  Respiratory: Negative for shortness of breath.   Cardiovascular: Negative for chest pain and palpitations.  Gastrointestinal: Negative for abdominal pain and nausea.  Genitourinary: Negative for dysuria and hematuria.  Musculoskeletal: Negative for joint pain and myalgias.  Skin: Negative for rash.  Neurological: Negative for tingling and headaches.  Psychiatric/Behavioral: Negative for depression. The patient is not nervous/anxious.     Objective:  BP 127/72 (BP Location: Left Arm, Patient Position: Sitting, Cuff Size: Large)   Pulse 87   Temp 97.9 F (36.6 C) (Oral)   Ht 5' (1.524 m)   Wt 259 lb 3.2 oz (117.6 kg)   LMP 12/20/2017 (Exact Date)   SpO2 96%   BMI 50.62 kg/m   BP/Weight 01/09/2018 12/11/2017 12/08/2017  Systolic BP 127 126 110  Diastolic BP 72 57 75  Wt. (Lbs) 259.2 - 261  BMI 50.62 - 50.97      Physical Exam Vitals signs reviewed.  Constitutional:      Comments: Well developed, obese, NAD, polite  HENT:     Head: Normocephalic and atraumatic.  Eyes:     General: No scleral icterus. Neck:     Musculoskeletal: Normal range of motion and neck supple.     Thyroid: No thyromegaly.  Cardiovascular:     Rate and Rhythm: Normal rate and regular rhythm.     Heart sounds: Normal  heart sounds.  Pulmonary:     Effort: Pulmonary effort is normal.     Breath sounds: Normal breath sounds.  Skin:    General: Skin is warm and dry.     Coloration: Skin is not pale.     Findings: No erythema or rash.  Neurological:     Mental Status: She is alert and oriented to person, place, and time.  Psychiatric:        Behavior: Behavior normal.        Thought Content: Thought content normal.      Assessment & Plan:    1. Morbid obesity (HCC) - Unfortunately patient's focus has not  been on herself but rather on the struggles of a rebellious and law breaking son. Pt has been counseled on returning after she clears the social and family issues she is encountering.   2. Noncompliance - Unfortunately patient's focus has not been on herself but rather on the struggles of a rebellious and law breaking son. Pt has been counseled on returning after she clears the social and family issues she is encountering.     Follow-up: Return if symptoms worsen or fail to improve.   Loletta Specteroger David Gomez PA

## 2018-01-09 NOTE — Patient Instructions (Signed)
Obesidad en los adultos (Obesity, Adult) La obesidad es un exceso de grasa corporal. Si usted tiene un ndice de masa corporal (IMC) de 30o ms, esto significa que es obeso. El IMC es un nmero que indica la cantidad de grasa corporal que tiene una persona. A menudo, la causa de la obesidad es el consumo de ms caloras que las que el cuerpo usa. La obesidad puede causar problemas de salud graves. Cambiar el estilo de vida puede ayudar a tratar la obesidad. CUIDADOS EN EL HOGAR Comida y bebida  Siga las indicaciones del mdico respecto de las comidas y las bebidas. Su mdico puede recomendarle lo siguiente: ? Reducir (limitar) las comidas rpidas, los dulces y las colaciones procesadas. ? Elegir opciones con bajo contenido de grasas. Por ejemplo, leche descremada en lugar de leche entera. ? Consumir 5o ms porciones de frutas o verduras por da. ? Comer en casa con ms frecuencia. Esto le da ms control sobre lo que come. ? Cuando coma afuera, elija alimentos saludables. ? Aprenda cul es el tamao de una porcin saludable. El tamao de una porcin es la cantidad de un alimento determinado que se considera saludable consumir de una vez. Esto es diferente para cada persona. ? Tenga a mano colaciones con bajo contenido de grasas. ? Evite las bebidas azucaradas. Estas incluyen refrescos, jugo de frutas, t helado endulzado con azcar y leche saborizada. ? Desayune de forma saludable.  Beba suficiente agua para mantener la orina clara o de color amarillo plido.  No est sin comer durante perodos prolongados (no ayune).  No haga dietas de moda (dietas pasajeras). Actividad fsica  Haga ejercicios con frecuencia, como se lo haya indicado el mdico. Pregntele al mdico lo siguiente: ? Los tipos de ejercicios que son seguros para usted. ? La frecuencia con la que debe hacer los ejercicios.  Haga un precalentamiento y elongacin adecuados antes de la actividad.  Haga un estiramiento lento  despus de la actividad (relajacin).  Descanse entre los perodos de actividad. Estilo de vida  Limite el tiempo que pasa frente a la televisin, la computadora o los videojuegos (sea menos sedentario).  Busque formas de recompensarse que no incluyan alimentos.  Limite el consumo de alcohol a no ms de 1 medida por da si es mujer y no est embarazada, y 2 medidas por da si es hombre. Una medida equivale a 12onzas de cerveza, 5onzas de vino o 1onzas de bebidas alcohlicas de alta graduacin. Instrucciones generales  Lleve un diario de la prdida de peso. Esto puede ayudarlo a mantener un registro de lo siguiente: ? Los alimentos que come. ? Los ejercicios que hace.  Tome los medicamentos de venta libre y los recetados solamente como se lo haya indicado el mdico.  Tome vitaminas y suplementos solamente como se lo haya indicado el mdico.  Considere participar en un grupo de apoyo. Su mdico puede ayudarlo con esto.  Concurra a todas las visitas de control como se lo haya indicado el mdico. Esto es importante. SOLICITE AYUDA SI:  No puede alcanzar su objetivo de prdida de peso despus de haber modificado su dieta y su estilo de vida durante 6semanas. Esta informacin no tiene como fin reemplazar el consejo del mdico. Asegrese de hacerle al mdico cualquier pregunta que tenga. Document Released: 07/02/2011 Document Revised: 04/24/2015 Document Reviewed: 10/19/2014 Elsevier Interactive Patient Education  2019 Elsevier Inc.  

## 2018-03-11 ENCOUNTER — Other Ambulatory Visit: Payer: Self-pay

## 2018-03-11 ENCOUNTER — Encounter (INDEPENDENT_AMBULATORY_CARE_PROVIDER_SITE_OTHER): Payer: Self-pay | Admitting: Primary Care

## 2018-03-11 ENCOUNTER — Ambulatory Visit (INDEPENDENT_AMBULATORY_CARE_PROVIDER_SITE_OTHER): Payer: Self-pay | Admitting: Primary Care

## 2018-03-11 DIAGNOSIS — M79602 Pain in left arm: Secondary | ICD-10-CM

## 2018-03-11 DIAGNOSIS — N946 Dysmenorrhea, unspecified: Secondary | ICD-10-CM

## 2018-03-11 DIAGNOSIS — M79601 Pain in right arm: Secondary | ICD-10-CM

## 2018-03-11 DIAGNOSIS — R1013 Epigastric pain: Secondary | ICD-10-CM

## 2018-03-11 MED ORDER — IBUPROFEN 600 MG PO TABS: 600.0000 mg | ORAL_TABLET | Freq: Three times a day (TID) | ORAL | 0 refills | Status: DC | PRN

## 2018-03-11 NOTE — Patient Instructions (Signed)
Dismenorrea Dysmenorrhea  La dismenorrea consiste en tener calambres dolorosos durante la menstruacin (perodo menstrual). Sentir dolor en la zona baja del vientre (abdomen). La causa del dolor son los espasmos (contracciones) de los msculos del tero. El dolor puede ser leve o muy intenso. Con esta afeccin, puede presentar lo siguiente:  Dolor de cabeza.  Malestar estomacal (nuseas).  Vmitos.  Sentir dolor en la parte inferior de la espalda. Siga estas indicaciones en su casa: Aliviar el dolor y los calambres   Aplique calor en la parte inferior de la espalda o el vientre cuando tiene dolor o calambres. Use la fuente de calor que el mdico le indique. ? Colquese una toalla entre la piel y la fuente de calor. ? Aplique el calor durante 20 a 30minutos. ? Retire la fuente de calor si la piel se pone de color rojo brillante. Esto es muy importante si no puede sentir el dolor, el calor o el fro. ? No duerma con la almohadilla trmica.  Haga ejercicios aerbicos. Estos pueden incluir caminar, nadar o andar en bicicleta. Pueden ayudar a aliviar los calambres.  Masajee la zona inferior de la espalda o el vientre. Esto puede ayudar a reducir el dolor. Instrucciones generales  Tome los medicamentos de venta libre y los recetados solamente como se lo haya indicado el mdico.  No conduzca ni use maquinaria pesada mientras toma analgsicos recetados.  Evite la ingesta de alcohol y cafena inmediatamente antes y durante el perodo menstrual. Esto puede empeorar los calambres.  No consuma ningn producto que contenga nicotina o tabaco. Esto incluye cigarrillos y cigarrillos electrnicos. Si necesita ayuda para dejar de fumar, consulte al mdico.  Concurra a todas las visitas de control como se lo haya indicado el mdico. Esto es importante. Comunquese con un mdico si:  Siente un dolor que empeora.  El dolor no mejora con medicamentos.  Siente dolor durante las relaciones  sexuales.  Siente malestar estomacal o tiene vmitos durante el perodo menstrual, que no se van con medicamentos. Solicite ayuda de inmediato si:  Pierde el conocimiento (se desmaya). Resumen  La dismenorrea consiste en tener calambres dolorosos durante la menstruacin (perodo menstrual).  Aplique calor en la parte inferior de la espalda o el vientre cuando tiene dolor o calambres.  Haga ejercicios aerbicos como caminar, nadar o andar en bicicleta.  Comunquese con un mdico si siente dolor durante la relacin sexual. Esta informacin no tiene como fin reemplazar el consejo del mdico. Asegrese de hacerle al mdico cualquier pregunta que tenga. Document Released: 02/02/2010 Document Revised: 07/04/2016 Document Reviewed: 07/04/2016 Elsevier Interactive Patient Education  2019 Elsevier Inc.  

## 2018-03-11 NOTE — Progress Notes (Signed)
Pain only comes during menses. The pain Is so bad that patient can not bare weight on her foot, can not lay down, can not stand, can not close legs

## 2018-06-04 NOTE — Progress Notes (Signed)
Established Patient Office Visit  Subjective:  Patient ID: Kayla Scott, female    DOB: July 05, 1975  Age: 43 y.o. MRN: [id number]  CC:  Chief Complaint  Patient presents with  . Abdominal Pain    HPI Kayla Scott presents abdominal pain  only on during menses. She states that the pain is so bad that she can not bare weight on her feet not able to lay down,stand or close her legs.  Past Medical History:  Diagnosis Date  . Diabetes mellitus without complication (HCC) 2016  . Gestational diabetes   . UTI (lower urinary tract infection)     Past Surgical History:  Procedure Laterality Date  . CESAREAN SECTION    . CESAREAN SECTION WITH BILATERAL TUBAL LIGATION Bilateral 10/18/2014   Procedure: REPEAT CESAREAN SECTION WITH BILATERAL TUBAL LIGATION;  Surgeon: Adam PhenixJames G Arnold, MD;  Location: [facility];  Service: Obstetrics;  Laterality: Bilateral;    Family History  Problem Relation Age of Onset  . Diabetes Mother   . Cancer Paternal Grandmother        uterine    Social History   Socioeconomic History  . Marital status: Married    Spouse name: Not on file  . Number of children: Not on file  . Years of education: Not on file  . Highest education level: Not on file  Occupational History  . Not on file  Social Needs  . Financial resource strain: Not on file  . Food insecurity:    Worry: Not on file    Inability: Not on file  . Transportation needs:    Medical: Not on file    Non-medical: Not on file  Tobacco Use  . Smoking status: Never Smoker  . Smokeless tobacco: Never Used  Substance and Sexual Activity  . Alcohol use: No  . Drug use: No  . Sexual activity: Yes    Birth control/protection: Surgical  Lifestyle  . Physical activity:    Days per week: Not on file    Minutes per session: Not on file  . Stress: Not on file  Relationships  . Social connections:    Talks on phone: Not on file    Gets together: Not on file    Attends  religious service: Not on file    Active member of club or organization: Not on file    Attends meetings of clubs or organizations: Not on file    Relationship status: Not on file  . Intimate partner violence:    Fear of current or ex partner: Not on file    Emotionally abused: Not on file    Physically abused: Not on file    Forced sexual activity: Not on file  Other Topics Concern  . Not on file  Social History Narrative  . Not on file    No outpatient medications prior to visit.   No facility-administered medications prior to visit.     Allergies  Allergen Reactions  . Metformin And Related Shortness Of Breath    Felt like throat was swelling    ROS Review of Systems  Constitutional: Positive for activity change.       Decrease on menes  HENT: Negative.   Eyes: Negative.   Respiratory: Negative.   Gastrointestinal: Positive for abdominal distention and abdominal pain.  Endocrine: Negative.   Genitourinary: Negative.   Musculoskeletal: Positive for arthralgias.       Only on menses   Skin: Negative.   Allergic/Immunologic: Negative.   Neurological:  Negative.   Hematological: Negative.   Psychiatric/Behavioral: Negative.       Objective:    Physical Exam  Constitutional: She is oriented to person, place, and time. She appears well-developed and well-nourished.  HENT:  Head: Normocephalic and atraumatic.  Eyes: Pupils are equal, round, and reactive to light. EOM are normal.  Neck: Normal range of motion. Neck supple.  Cardiovascular: Normal rate and regular rhythm.  Pulmonary/Chest: Effort normal and breath sounds normal.  Abdominal: Soft. Bowel sounds are normal. There is abdominal tenderness.  Musculoskeletal: Normal range of motion.  Neurological: She is oriented to person, place, and time.  Skin: Skin is warm.  Psychiatric: She has a normal mood and affect.    BP 117/75 (BP Location: Left Arm, Patient Position: Sitting, Cuff Size: Large)   Pulse 82    Temp 97.8 F (36.6 C) (Oral)   Ht 5' (1.524 m)   Wt 263 lb 3.2 oz (119.4 kg)   LMP 03/09/2018 (Exact Date)   SpO2 100%   BMI 51.40 kg/m  Wt Readings from Last 3 Encounters:  03/11/18 263 lb 3.2 oz (119.4 kg)  01/09/18 259 lb 3.2 oz (117.6 kg)  12/08/17 261 lb (118.4 kg)     Health Maintenance Due  Topic Date Due  . PNEUMOCOCCAL POLYSACCHARIDE VACCINE AGE 58-64 HIGH RISK  01/27/1978    There are no preventive care reminders to display for this patient.  Lab Results  Component Value Date   TSH 1.720 03/28/2017   Lab Results  Component Value Date   WBC 8.8 07/30/2017   HGB 13.0 07/30/2017   HCT 40.2 07/30/2017   MCV 96.2 07/30/2017   PLT 277 07/30/2017   Lab Results  Component Value Date   NA 139 12/17/2017   K 4.4 12/17/2017   CO2 24 12/17/2017   GLUCOSE 102 (H) 12/17/2017   BUN 13 12/17/2017   CREATININE 0.63 12/17/2017   BILITOT 0.4 07/30/2017   ALKPHOS 77 07/30/2017   AST 21 07/30/2017   ALT 19 07/30/2017   PROT 7.0 07/30/2017   ALBUMIN 3.2 (L) 07/30/2017   CALCIUM 9.1 12/17/2017   ANIONGAP 6 07/30/2017   Lab Results  Component Value Date   CHOL 165 12/17/2017   Lab Results  Component Value Date   HDL 47 12/17/2017   Lab Results  Component Value Date   LDLCALC 94 12/17/2017   Lab Results  Component Value Date   TRIG 119 12/17/2017   Lab Results  Component Value Date   CHOLHDL 3.5 12/17/2017   Lab Results  Component Value Date   HGBA1C 5.5 12/08/2017      Assessment & Plan:   Problem List Items Addressed This Visit    None    Visit Diagnoses    Morbid obesity (HCC)    -  Primary   Abdominal pain, epigastric       Dysmenorrhea       Pain in both upper extremities       Relevant Medications   ibuprofen (ADVIL,MOTRIN) 600 MG tablet    Ariellah was seen today for abdominal pain.  Diagnoses and all orders for this visit:  Morbid obesity (HCC) BMI 51 encourage weight loss, exercise, reduction in calories to 1500 daily esp carbs  rice (reduce amt), tortilla, breads sweets.  Abdominal pain, epigastric Spicy foods is a probable cause may use OTC ie Pepcid or omeprazole. Do not lay down after eating.   Dysmenorrhea NSAIDs has shown to be affective prior to onset of  menstrual cycles and during .   Pain in both upper extremities -     Discontinue: ibuprofen (ADVIL,MOTRIN) 600 MG tablet; Take 1 tablet (600 mg total) by mouth every 8 (eight) hours as needed. -     ibuprofen (ADVIL,MOTRIN) 600 MG tablet; Take 1 tablet (600 mg total) by mouth every 8 (eight) hours as needed.    Meds ordered this encounter  Medications  . DISCONTD: ibuprofen (ADVIL,MOTRIN) 600 MG tablet    Sig: Take 1 tablet (600 mg total) by mouth every 8 (eight) hours as needed.    Dispense:  30 tablet    Refill:  0  . ibuprofen (ADVIL,MOTRIN) 600 MG tablet    Sig: Take 1 tablet (600 mg total) by mouth every 8 (eight) hours as needed.    Dispense:  30 tablet    Refill:  0    Follow-up: Return if symptoms worsen or fail to improve, for menstrual cramps and pain.    Grayce Sessions, NP

## 2019-02-08 ENCOUNTER — Ambulatory Visit: Payer: Self-pay | Attending: Internal Medicine

## 2019-02-08 DIAGNOSIS — Z20822 Contact with and (suspected) exposure to covid-19: Secondary | ICD-10-CM | POA: Insufficient documentation

## 2019-02-09 LAB — NOVEL CORONAVIRUS, NAA: SARS-CoV-2, NAA: NOT DETECTED

## 2019-04-16 ENCOUNTER — Ambulatory Visit: Payer: Self-pay | Attending: Internal Medicine

## 2019-04-16 DIAGNOSIS — Z20822 Contact with and (suspected) exposure to covid-19: Secondary | ICD-10-CM

## 2019-04-17 LAB — NOVEL CORONAVIRUS, NAA: SARS-CoV-2, NAA: DETECTED — AB

## 2019-04-17 LAB — SARS-COV-2, NAA 2 DAY TAT

## 2019-04-18 ENCOUNTER — Other Ambulatory Visit (HOSPITAL_COMMUNITY): Payer: Self-pay | Admitting: Nurse Practitioner

## 2019-04-18 ENCOUNTER — Encounter: Payer: Self-pay | Admitting: Nurse Practitioner

## 2019-04-18 ENCOUNTER — Telehealth (HOSPITAL_COMMUNITY): Payer: Self-pay | Admitting: Nurse Practitioner

## 2019-04-18 DIAGNOSIS — U071 COVID-19: Secondary | ICD-10-CM

## 2019-04-18 MED ORDER — SODIUM CHLORIDE 0.9 % IV SOLN
700.0000 mg | Freq: Once | INTRAVENOUS | Status: AC
  Administered 2019-04-19: 700 mg via INTRAVENOUS
  Filled 2019-04-18: qty 700

## 2019-04-18 NOTE — Telephone Encounter (Signed)
Called to Discuss with patient about Covid symptoms and the use of bamlanivimab, a monoclonal antibody infusion for those with mild to moderate Covid symptoms and at a high risk of hospitalization.     Pt is qualified for this infusion at the [facility] infusion center due to co-morbid conditions and/or a member of an at-risk group.     Jari Favre of [employer] assisted with the call due to language barrier. Left message on cell number. Home line not a valid number. Attempted home/mobile number for Esteban/spouse. Patient answered. Onset of symptoms was 8 days ago. Patient has diabetes and interested in MAB treatment. See orders encounter.   Consuello Masse, DNP, AGNP-C [phone] (Infusion Center Hotline)

## 2019-04-18 NOTE — Progress Notes (Signed)
I connected by phone with Kayla Scott on 04/18/2019 at 9:00 AM to discuss the potential use of an new treatment for mild to moderate COVID-19 viral infection in non-hospitalized patients.  This patient is a 44 y.o. female that meets the FDA criteria for Emergency Use Authorization of bamlanivimab or casirivimab\imdevimab.  Has a (+) direct SARS-CoV-2 viral test result  Has mild or moderate COVID-19   Is ? 43 years of age and weighs ? 40 kg  Is NOT hospitalized due to COVID-19  Is NOT requiring oxygen therapy or requiring an increase in baseline oxygen flow rate due to COVID-19  Is within 10 days of symptom onset  Has at least one of the high risk factor(s) for progression to severe COVID-19 and/or hospitalization as defined in EUA.  Specific high risk criteria : Diabetes   I have spoken and communicated the following to the patient or parent/caregiver with the assistance of Jari Favre of [employer] who assisted with the call due to language barrier:   1. FDA has authorized the emergency use of bamlanivimab and casirivimab\imdevimab for the treatment of mild to moderate COVID-19 in adults and pediatric patients with positive results of direct SARS-CoV-2 viral testing who are 59 years of age and older weighing at least 40 kg, and who are at high risk for progressing to severe COVID-19 and/or hospitalization.  2. The significant known and potential risks and benefits of bamlanivimab and casirivimab\imdevimab, and the extent to which such potential risks and benefits are unknown.  3. Information on available alternative treatments and the risks and benefits of those alternatives, including clinical trials.  4. Patients treated with bamlanivimab and casirivimab\imdevimab should continue to self-isolate and use infection control measures (e.g., wear mask, isolate, social distance, avoid sharing personal items, clean and disinfect "high touch" surfaces, and frequent  handwashing) according to CDC guidelines.   5. The patient or parent/caregiver has the option to accept or refuse bamlanivimab or casirivimab\imdevimab .  After reviewing this information with the patient, The patient agreed to proceed with receiving the bamlanimivab infusion and will be provided a copy of the Fact sheet prior to receiving the infusion.Consuello Masse, DNP, AGNP-C [phone] (Infusion Center Hotline)

## 2019-04-19 ENCOUNTER — Ambulatory Visit (HOSPITAL_COMMUNITY)
Admission: RE | Admit: 2019-04-19 | Discharge: 2019-04-19 | Disposition: A | Discharge status: Home / Self Care | Payer: HRSA Program | Source: Ambulatory Visit | Attending: Pulmonary Disease | Admitting: Pulmonary Disease

## 2019-04-19 DIAGNOSIS — U071 COVID-19: Secondary | ICD-10-CM

## 2019-04-19 MED ORDER — FAMOTIDINE IN NACL 20-0.9 MG/50ML-% IV SOLN: 20.0000 mg | Freq: Once | INTRAVENOUS | Status: DC | PRN

## 2019-04-19 MED ORDER — DIPHENHYDRAMINE HCL 50 MG/ML IJ SOLN: 50.0000 mg | Freq: Once | INTRAMUSCULAR | Status: DC | PRN

## 2019-04-19 MED ORDER — SODIUM CHLORIDE 0.9 % IV SOLN: INTRAVENOUS | Status: DC | PRN

## 2019-04-19 MED ORDER — ALBUTEROL SULFATE HFA 108 (90 BASE) MCG/ACT IN AERS: 2.0000 | INHALATION_SPRAY | Freq: Once | RESPIRATORY_TRACT | Status: DC | PRN

## 2019-04-19 MED ORDER — EPINEPHRINE 0.3 MG/0.3ML IJ SOAJ: 0.3000 mg | Freq: Once | INTRAMUSCULAR | Status: DC | PRN

## 2019-04-19 MED ORDER — METHYLPREDNISOLONE SODIUM SUCC 125 MG IJ SOLR: 125.0000 mg | Freq: Once | INTRAMUSCULAR | Status: DC | PRN

## 2019-04-19 NOTE — Progress Notes (Signed)
  Diagnosis: COVID-19  Physician: Dr. Wright  Procedure: Covid Infusion Clinic Med: bamlanivimab infusion - Provided patient with bamlanimivab fact sheet for patients, parents and caregivers prior to infusion.  Complications: No immediate complications noted.  Discharge: Discharged home   Masayo Fera S Camerin Jimenez 04/19/2019  

## 2019-04-19 NOTE — Discharge Instructions (Signed)
COVID-19: Cmo protegerse y proteger a los dems COVID-19: How to Protect Yourself and Others Sepa cmo se propaga  Actualmente, no existe ninguna vacuna para prevenir la enfermedad por coronavirus 2019 (COVID-19).  La mejor forma de prevenir la enfermedad es evitar exponerse a este virus.  Se cree que el virus se transmite principalmente de una persona a otra. ? Entre las personas que estn en contacto directo entre s (a una distancia inferior a 6 pies [1.80m]). ? A travs de las gotitas respiratorias producidas cuando una persona infectada tose, estornuda o habla. ? Estas gotitas pueden caer en la boca o en la nariz de las personas que estn cerca o pueden ser inhaladas hacia los pulmones. ? El COVID-19 puede ser transmitido por personas que no presentan sntomas. Lo que todos deben hacer Lmpiese las manos con frecuencia  Lvese las manos con frecuencia con agua y jabn durante al menos 20 segundos, especialmente despus de haber estado en un lugar pblico o despus de sonarse la nariz, toser o estornudar.  Si no dispone de agua y jabn, use un desinfectante de manos que contenga al menos un 60% de alcohol. Cubra todas las superficies de las manos y frtelas hasta que se sientan secas.  No se toque los ojos, la nariz y la boca sin antes lavarse las manos. Evite el contacto cercano  Limite el contacto directo con otras personas tanto como sea posible.  Evite el contacto cercano con personas que estn enfermas.  Establezca distancia entre usted y otras personas. ? Recuerde que algunas personas que no tienen sntomas pueden transmitir el virus. ? Esto es especialmente importante para las personas que tienen ms riesgo de enfermarse.www.cdc.gov/coronavirus/2019-ncov/need-extra-precautions/people-at-higher-risk.html Cbrase la boca y la nariz con una mascarilla cuando est cerca de otras personas  Puede transmitir el COVID-19 a otras personas aunque no se sienta enfermo.  Todas las  personas deben usar mascarilla en lugares pblicos y cuando estn con otras que no vivan en su casa, especialmente cuando el distanciamiento social sea difcil de mantener. ? Las mascarillas no deben colocarse a nios menores de 2 aos de edad, a las personas que tienen problemas respiratorios o que estn inconscientes, incapacitadas o que por algn motivo no puedan quitarse la mascarilla sin ayuda.  El propsito de la mascarilla es proteger a otras personas en caso de que usted est infectado.  NO utilice las mascarillas destinadas a los trabajadores de la salud.  Contine manteniendo una distancia aproximada de 6 pies (1.80m) entre usted y otras personas. La mascarilla no reemplaza el distanciamiento social. Cbrase al toser y estornudar  Al toser o al estornudar, siempre cbrase la boca y la nariz con un pauelo descartable o use el interior del codo.  Deseche los pauelos descartables usados en la basura.  Inmediatamente, lvese las manos con agua y jabn durante al menos 20segundos. Si no dispone de agua y jabn, lmpiese las manos con un desinfectante de manos que contenga al menos un 60% de alcohol. Limpie y desinfecte  Limpie Y desinfecte las superficies que se tocan con frecuencia todos los das. Esto incluye mesas, picaportes, interruptores de luz, encimeras, mangos, escritorios, telfonos, teclados, inodoros, grifos y lavabos. www.cdc.gov/coronavirus/2019-ncov/prevent-getting-sick/disinfecting-your-home.html  Si las superficies estn sucias, lmpielas: Use detergente o jabn y agua antes de la desinfeccin.  Luego, use un desinfectante domstico. Puede consultar una lista de los desinfectantes domsticos registrados en la Environmental Protection Agency (EPA) (Agencia de Proteccin Ambiental) aqu. cdc.gov/coronavirus 09/16/2018 Esta informacin no tiene como fin reemplazar el consejo del mdico.   Asegrese de hacerle al mdico cualquier pregunta que tenga. Document Revised:  09/30/2018 Document Reviewed: 07/24/2018 Elsevier Patient Education  2020 Elsevier Inc. What types of side effects do monoclonal antibody drugs cause?  Common side effects  In general, the more common side effects caused by monoclonal antibody drugs include: . Allergic reactions, such as hives or itching . Flu-like signs and symptoms, including chills, fatigue, fever, and muscle aches and pains . Nausea, vomiting . Diarrhea . Skin rashes . Low blood pressure   The CDC is recommending patients who receive monoclonal antibody treatments wait at least 90 days before being vaccinated.  Currently, there are no data on the safety and efficacy of mRNA COVID-19 vaccines in persons who received monoclonal antibodies or convalescent plasma as part of COVID-19 treatment. Based on the estimated half-life of such therapies as well as evidence suggesting that reinfection is uncommon in the 90 days after initial infection, vaccination should be deferred for at least 90 days, as a precautionary measure until additional information becomes available, to avoid interference of the antibody treatment with vaccine-induced immune responses. 

## 2019-06-03 ENCOUNTER — Encounter (HOSPITAL_COMMUNITY): Payer: Self-pay

## 2019-06-03 ENCOUNTER — Ambulatory Visit (HOSPITAL_COMMUNITY)
Admission: EM | Admit: 2019-06-03 | Discharge: 2019-06-03 | Disposition: A | Discharge status: Home / Self Care | Payer: Self-pay | Attending: Family Medicine | Admitting: Family Medicine

## 2019-06-03 DIAGNOSIS — N760 Acute vaginitis: Secondary | ICD-10-CM

## 2019-06-03 DIAGNOSIS — N390 Urinary tract infection, site not specified: Secondary | ICD-10-CM

## 2019-06-03 LAB — POCT URINALYSIS DIP (DEVICE)
Bilirubin Urine: NEGATIVE
Glucose, UA: 100 mg/dL — AB
Ketones, ur: NEGATIVE mg/dL
Nitrite: NEGATIVE
Protein, ur: NEGATIVE mg/dL
Specific Gravity, Urine: 1.025 (ref 1.005–1.030)
Urobilinogen, UA: 0.2 mg/dL (ref 0.0–1.0)
pH: 5 (ref 5.0–8.0)

## 2019-06-03 MED ORDER — LIDOCAINE HCL 2 % EX GEL: CUTANEOUS | 0 refills | Status: DC

## 2019-06-03 MED ORDER — FLUCONAZOLE 150 MG PO TABS: 150.0000 mg | ORAL_TABLET | Freq: Every day | ORAL | 0 refills | Status: DC

## 2019-06-03 NOTE — ED Notes (Signed)
I used interpreter Ipad to triage pt. 

## 2019-06-03 NOTE — ED Triage Notes (Signed)
Pt c/o vaginal itching, dysuria, 6/10 pelvic painx1.5 mos.

## 2019-06-03 NOTE — ED Provider Notes (Signed)
[facility]    CSN: [id number] Arrival date & time: 06/03/19  0847      History   Chief Complaint Chief Complaint  Patient presents with  . Urinary Tract Infection    HPI Kayla Scott is a 44 y.o. female.   HPI  Patient states that she has had vaginal pain, scant discharge, pain with urination for about a month and a half.  She has never had this before.  She has had some Suprapubic pressure.  No urinary frequency.  She states the pain with urination is because the urine burns when it hits the irritated vaginal skin.  She is seen with Spanish interpreter.  She has sexual relations only with her husband and feels there is a low likelihood of sexually transmitted disease.  No fever or chills.  No nausea or vomiting.  Past Medical History:  Diagnosis Date  . Diabetes mellitus without complication ([name]) 1308  . Gestational diabetes   . UTI (lower urinary tract infection)     Patient Active Problem List   Diagnosis Date Noted  . Prediabetes 03/28/2017  . Dermatitis 03/28/2017  . Left lower quadrant pain 03/28/2017  . Class 3 severe obesity with serious comorbidity and body mass index (BMI) of 45.0 to 49.9 in adult ([place]) 03/28/2017  . S/P C-section 10/18/2014    Past Surgical History:  Procedure Laterality Date  . CESAREAN SECTION    . CESAREAN SECTION WITH BILATERAL TUBAL LIGATION Bilateral 10/18/2014   Procedure: REPEAT CESAREAN SECTION WITH BILATERAL TUBAL LIGATION;  Surgeon: Woodroe Mode, MD;  Location: [facility];  Service: Obstetrics;  Laterality: Bilateral;    OB History    Gravida  8   Para  5   Term  4   Preterm  1   AB  3   Living  5     SAB  3   TAB      Ectopic      Multiple  0   Live Births  5            Home Medications    Prior to Admission medications   Medication Sig Start Date End Date Taking? Authorizing Provider  fluconazole (DIFLUCAN) 150 MG tablet Take 1 tablet (150 mg total) by mouth daily. Repeat in  1 week if needed 06/03/19   Raylene Everts, MD  lidocaine (XYLOCAINE) 2 % jelly Apply to painful area as needed for bathing and urination pain 06/03/19   Raylene Everts, MD    Family History Family History  Problem Relation Age of Onset  . Diabetes Mother   . Cancer Paternal Grandmother        uterine    Social History Social History   Tobacco Use  . Smoking status: Never Smoker  . Smokeless tobacco: Never Used  Substance Use Topics  . Alcohol use: No  . Drug use: No     Allergies   Metformin and related   Review of Systems Review of Systems  Genitourinary: Positive for dyspareunia, dysuria, vaginal discharge and vaginal pain. Negative for flank pain, frequency and menstrual problem.     Physical Exam Triage Vital Signs ED Triage Vitals  Enc Vitals Group     BP 06/03/19 0945 (!) 126/91     Pulse Rate 06/03/19 0945 83     Resp 06/03/19 0945 18     Temp 06/03/19 0945 98.2 F (36.8 C)     Temp Source 06/03/19 0945 Oral  SpO2 06/03/19 0945 99 %     Weight 06/03/19 0950 280 lb (127 kg)     Height 06/03/19 0950 5\' 1"  (1.549 m)     Head Circumference --      Peak Flow --      Pain Score 06/03/19 0950 6     Pain Loc --      Pain Edu? --      Excl. in GC? --    No data found.  Updated Vital Signs BP (!) 126/91   Pulse 83   Temp 98.2 F (36.8 C) (Oral)   Resp 18   Ht 5\' 1"  (1.549 m)   Wt 127 kg   SpO2 99%   BMI 52.91 kg/m       Physical Exam Constitutional:      General: She is not in acute distress.    Appearance: She is well-developed. She is obese.  HENT:     Head: Normocephalic and atraumatic.     Nose:     Comments: Mask in place Eyes:     Conjunctiva/sclera: Conjunctivae normal.     Pupils: Pupils are equal, round, and reactive to light.  Cardiovascular:     Rate and Rhythm: Normal rate and regular rhythm.  Pulmonary:     Effort: Pulmonary effort is normal. No respiratory distress.     Breath sounds: Normal breath sounds.    Abdominal:     General: There is no distension.     Palpations: Abdomen is soft.     Tenderness: There is no abdominal tenderness.  Genitourinary:    Comments: Right labia slightly swollen, skin is irritated.  Scant white discharge.  No odor Musculoskeletal:        General: Normal range of motion.     Cervical back: Normal range of motion.  Skin:    General: Skin is warm and dry.  Neurological:     Mental Status: She is alert.  Psychiatric:        Mood and Affect: Mood normal.        Behavior: Behavior normal.      UC Treatments / Results  Labs (all labs ordered are listed, but only abnormal results are displayed) Labs Reviewed - No data to display  EKG   Radiology No results found.  Procedures Procedures (including critical care time)  Medications Ordered in UC Medications - No data to display  Initial Impression / Assessment and Plan / UC Course  I have reviewed the triage vital signs and the nursing notes.  Pertinent labs & imaging results that were available during my care of the patient were reviewed by me and considered in my medical decision making (see chart for details).     Will treat for acute vaginitis.  Likely candida Final Clinical Impressions(s) / UC Diagnoses   Final diagnoses:  Acute vaginitis     Discharge Instructions     Return if not better in a week   ED Prescriptions    Medication Sig Dispense Auth. Provider   fluconazole (DIFLUCAN) 150 MG tablet Take 1 tablet (150 mg total) by mouth daily. Repeat in 1 week if needed 2 tablet 06/05/19, MD   lidocaine (XYLOCAINE) 2 % jelly Apply to painful area as needed for bathing and urination pain 30 mL , MD     PDMP not reviewed this encounter.   Eustace Moore, MD 06/03/19 1049

## 2019-06-03 NOTE — Discharge Instructions (Signed)
Return if not better in a week

## 2020-03-14 DIAGNOSIS — M1711 Unilateral primary osteoarthritis, right knee: Secondary | ICD-10-CM

## 2020-03-14 HISTORY — DX: Unilateral primary osteoarthritis, right knee: M17.11

## 2020-05-09 ENCOUNTER — Other Ambulatory Visit: Payer: Self-pay

## 2020-05-09 ENCOUNTER — Encounter (HOSPITAL_COMMUNITY): Payer: Self-pay

## 2020-05-09 ENCOUNTER — Emergency Department (HOSPITAL_COMMUNITY): Payer: Self-pay

## 2020-05-09 ENCOUNTER — Emergency Department (HOSPITAL_COMMUNITY)
Admission: EM | Admit: 2020-05-09 | Discharge: 2020-05-09 | Disposition: A | Discharge status: Home / Self Care | Payer: Self-pay | Attending: Emergency Medicine | Admitting: Emergency Medicine

## 2020-05-09 DIAGNOSIS — K760 Fatty (change of) liver, not elsewhere classified: Secondary | ICD-10-CM | POA: Insufficient documentation

## 2020-05-09 DIAGNOSIS — R1031 Right lower quadrant pain: Secondary | ICD-10-CM

## 2020-05-09 DIAGNOSIS — E119 Type 2 diabetes mellitus without complications: Secondary | ICD-10-CM | POA: Insufficient documentation

## 2020-05-09 LAB — COMPREHENSIVE METABOLIC PANEL
ALT: 33 U/L (ref 0–44)
AST: 23 U/L (ref 15–41)
Albumin: 2.8 g/dL — ABNORMAL LOW (ref 3.5–5.0)
Alkaline Phosphatase: 89 U/L (ref 38–126)
Anion gap: 7 (ref 5–15)
BUN: 15 mg/dL (ref 6–20)
CO2: 26 mmol/L (ref 22–32)
Calcium: 8.7 mg/dL — ABNORMAL LOW (ref 8.9–10.3)
Chloride: 101 mmol/L (ref 98–111)
Creatinine, Ser: 0.55 mg/dL (ref 0.44–1.00)
GFR, Estimated: >60 mL/min (ref >60)
Glucose, Bld: 240 mg/dL — ABNORMAL HIGH (ref 70–99)
Potassium: 4.1 mmol/L (ref 3.5–5.1)
Sodium: 134 mmol/L — ABNORMAL LOW (ref 135–145)
Total Bilirubin: 0.6 mg/dL (ref 0.3–1.2)
Total Protein: 6.3 g/dL — ABNORMAL LOW (ref 6.5–8.1)

## 2020-05-09 LAB — URINALYSIS, ROUTINE W REFLEX MICROSCOPIC
Bacteria, UA: NONE SEEN
Bilirubin Urine: NEGATIVE
Glucose, UA: >=500 mg/dL — AB
Hgb urine dipstick: NEGATIVE
Ketones, ur: NEGATIVE mg/dL
Leukocytes,Ua: NEGATIVE
Nitrite: NEGATIVE
Protein, ur: NEGATIVE mg/dL
Specific Gravity, Urine: 1.012 (ref 1.005–1.030)
pH: 6 (ref 5.0–8.0)

## 2020-05-09 LAB — CBC
HCT: 40.2 % (ref 36.0–46.0)
Hemoglobin: 13.6 g/dL (ref 12.0–15.0)
MCH: 32.6 pg (ref 26.0–34.0)
MCHC: 33.8 g/dL (ref 30.0–36.0)
MCV: 96.4 fL (ref 80.0–100.0)
Platelets: 257 10*3/uL (ref 150–400)
RBC: 4.17 MIL/uL (ref 3.87–5.11)
RDW: 13.2 % (ref 11.5–15.5)
WBC: 9 10*3/uL (ref 4.0–10.5)
nRBC: 0 % (ref 0.0–0.2)

## 2020-05-09 LAB — I-STAT BETA HCG BLOOD, ED (MC, WL, AP ONLY): I-stat hCG, quantitative: <5 m[IU]/mL (ref <5)

## 2020-05-09 LAB — LIPASE, BLOOD: Lipase: 42 U/L (ref 11–51)

## 2020-05-09 MED ORDER — IOHEXOL 300 MG/ML  SOLN
100.0000 mL | Freq: Once | INTRAMUSCULAR | Status: AC | PRN
  Administered 2020-05-09: 100 mL via INTRAVENOUS

## 2020-05-09 MED ORDER — SODIUM CHLORIDE 0.9 % IV BOLUS
1000.0000 mL | Freq: Once | INTRAVENOUS | Status: AC
  Administered 2020-05-09: 1000 mL via INTRAVENOUS

## 2020-05-09 MED ORDER — MORPHINE SULFATE (PF) 2 MG/ML IV SOLN
2.0000 mg | Freq: Once | INTRAVENOUS | Status: AC
  Administered 2020-05-09: 2 mg via INTRAVENOUS
  Filled 2020-05-09: qty 1

## 2020-05-09 NOTE — ED Triage Notes (Signed)
Pt is here today due to RLQ abd pain started x3 weeks ago.Pt denies any N&V&D. Pt reports h/o diabetes. Pt reports she hasn't had a menstrual cycle in over a year but last week she started having spotting again.

## 2020-05-09 NOTE — ED Provider Notes (Signed)
[name] EMERGENCY DEPARTMENT Provider Note   CSN: [id number] Arrival date & time: 05/09/20  0533     History Chief Complaint  Patient presents with  . Abdominal Pain   Spanish video interpreter used throughout this visit. Kayla Scott is a 45 y.o. female history of morbid obesity, diabetes.  Patient presents today for evaluation of right lower quadrant pain onset 3 weeks ago pain has been constant throbbing moderate-severe intensity occasionally radiating down her right leg no clear inciting event.  Pain is worsened with movement and palpation.  No alleviating factors.  Patient denies similar pain in the past.  Patient is concerned for appendicitis today.  Patient denies fever/chills, nausea/vomiting, diarrhea, chest pain, numbness/weakness, concern for STI, vaginal discharge, dysuria/hematuria or any additional concerns.  HPI     Past Medical History:  Diagnosis Date  . Diabetes mellitus without complication (HCC) 2016  . Gestational diabetes   . UTI (lower urinary tract infection)     Patient Active Problem List   Diagnosis Date Noted  . Prediabetes 03/28/2017  . Dermatitis 03/28/2017  . Left lower quadrant pain 03/28/2017  . Class 3 severe obesity with serious comorbidity and body mass index (BMI) of 45.0 to 49.9 in adult (HCC) 03/28/2017  . S/P C-section 10/18/2014    Past Surgical History:  Procedure Laterality Date  . CESAREAN SECTION    . CESAREAN SECTION WITH BILATERAL TUBAL LIGATION Bilateral 10/18/2014   Procedure: REPEAT CESAREAN SECTION WITH BILATERAL TUBAL LIGATION;  Surgeon: Adam Phenix, MD;  Location: [facility];  Service: Obstetrics;  Laterality: Bilateral;     OB History    Gravida  8   Para  5   Term  4   Preterm  1   AB  3   Living  5     SAB  3   IAB      Ectopic      Multiple  0   Live Births  5           Family History  Problem Relation Age of Onset  . Diabetes Mother   . Cancer  Paternal Grandmother        uterine    Social History   Tobacco Use  . Smoking status: Never Smoker  . Smokeless tobacco: Never Used  Substance Use Topics  . Alcohol use: No  . Drug use: No    Home Medications Prior to Admission medications   Medication Sig Start Date End Date Taking? Authorizing Provider  fluconazole (DIFLUCAN) 150 MG tablet Take 1 tablet (150 mg total) by mouth daily. Repeat in 1 week if needed 06/03/19   Eustace Moore, MD  lidocaine (XYLOCAINE) 2 % jelly Apply to painful area as needed for bathing and urination pain 06/03/19   Eustace Moore, MD    Allergies    Metformin and related  Review of Systems   Review of Systems Ten systems are reviewed and are negative for acute change except as noted in the HPI  Physical Exam Updated Vital Signs BP 128/88 (BP Location: Left Wrist)   Pulse 70   Temp 97.6 F (36.4 C) (Oral)   Resp 14   LMP 05/02/2020   SpO2 100%   Physical Exam Constitutional:      General: She is not in acute distress.    Appearance: Normal appearance. She is well-developed. She is not ill-appearing or diaphoretic.  HENT:     Head: Normocephalic and atraumatic.  Eyes:  General: Vision grossly intact. Gaze aligned appropriately.     Pupils: Pupils are equal, round, and reactive to light.  Neck:     Trachea: Trachea and phonation normal.  Cardiovascular:     Rate and Rhythm: Normal rate and regular rhythm.     Pulses:          Dorsalis pedis pulses are 2+ on the right side and 2+ on the left side.  Pulmonary:     Effort: Pulmonary effort is normal. No respiratory distress.  Abdominal:     General: There is no distension.     Palpations: Abdomen is soft.     Tenderness: There is abdominal tenderness in the right lower quadrant. There is no guarding or rebound. Positive signs include Rovsing's sign and McBurney's sign. Negative signs include Murphy's sign.  Genitourinary:    Comments: Deferred by patient Musculoskeletal:         General: Normal range of motion.     Cervical back: Normal range of motion.       Legs:     Comments: No midline C/T/L spinal tenderness to palpation, no deformity, crepitus, or step-off noted. No sign of injury to the neck or back. - Tenderness of the right lower abdomen, tenderness extends with gentle palpation overlying the right gluteal muscles as well without overlying skin change.  Full range of motion of the right hip knee and foot/ankle.  Feet:     Right foot:     Protective Sensation: 3 sites tested. 3 sites sensed.     Left foot:     Protective Sensation: 3 sites tested. 3 sites sensed.  Skin:    General: Skin is warm and dry.  Neurological:     Mental Status: She is alert.     GCS: GCS eye subscore is 4. GCS verbal subscore is 5. GCS motor subscore is 6.     Comments: Speech is clear and goal oriented, follows commands Major Cranial nerves without deficit, no facial droop Moves extremities without ataxia, coordination intact  Psychiatric:        Behavior: Behavior normal.     ED Results / Procedures / Treatments   Labs (all labs ordered are listed, but only abnormal results are displayed) Labs Reviewed  COMPREHENSIVE METABOLIC PANEL - Abnormal; Notable for the following components:      Result Value   Sodium 134 (*)    Glucose, Bld 240 (*)    Calcium 8.7 (*)    Total Protein 6.3 (*)    Albumin 2.8 (*)    All other components within normal limits  URINALYSIS, ROUTINE W REFLEX MICROSCOPIC - Abnormal; Notable for the following components:   Color, Urine STRAW (*)    Glucose, UA >=500 (*)    All other components within normal limits  LIPASE, BLOOD  CBC  I-STAT BETA HCG BLOOD, ED ([facility], [facility], [facility] ONLY)    EKG None  Radiology CT ABDOMEN PELVIS W CONTRAST  Result Date: 05/09/2020 CLINICAL DATA:  Lower abdominal pain EXAM: CT ABDOMEN AND PELVIS WITH CONTRAST TECHNIQUE: Multidetector CT imaging of the abdomen and pelvis was performed using the standard protocol  following bolus administration of intravenous contrast. CONTRAST:  100mL OMNIPAQUE IOHEXOL 300 MG/ML  SOLN COMPARISON:  July 15, 2016 FINDINGS: Lower chest: Lung bases are clear. Hepatobiliary: There is hepatic steatosis. No focal liver lesions are appreciable. The gallbladder wall is not appreciably thickened. There is no biliary dilatation. Pancreas: There is no pancreatic mass inflammatory focus. Spleen: No  splenic lesions are evident. Adrenals/Urinary Tract: Adrenals bilaterally appear normal. No evident renal mass or hydronephrosis on either side. There is no evident renal or ureteral calculus on either side. Urinary bladder is midline with wall thickness within normal limits. Stomach/Bowel: There is no appreciable bowel wall or mesenteric thickening. There is moderate stool in the colon. There is no evident bowel obstruction. Terminal ileum appears unremarkable. No periappendiceal region inflammation evident. Vascular/Lymphatic: No abdominal aortic aneurysm. No arterial vascular lesions evident. Major venous structures appear patent. There is no evident adenopathy in the abdomen or pelvis. Reproductive: Uterus is anteverted. No evident adnexal mass. Tubal ligation clips noted bilaterally. Other: No abscess or ascites evident in the abdomen or pelvis. There is mild fat in the umbilicus. Musculoskeletal: There are foci of degenerative change in the lumbar spine. There is a calcified disc protrusion on the right paracentrally at L1-2. No blastic or lytic bone lesions evident. No intramuscular lesions. IMPRESSION: 1.  Hepatic steatosis. 2. No bowel wall thickening or bowel obstruction. No periappendiceal region inflammation. No abscess in the abdomen or pelvis. 3. No renal or ureteral calculus. No hydronephrosis. Urinary bladder wall thickness within normal limits. Electronically Signed   By: Bretta Bang III M.D.   On: 05/09/2020 08:07    Procedures Procedures   Medications Ordered in ED Medications   sodium chloride 0.9 % bolus 1,000 mL (1,000 mLs Intravenous New Bag/Given 05/09/20 0719)  morphine 2 MG/ML injection 2 mg (2 mg Intravenous Given 05/09/20 0719)  iohexol (OMNIPAQUE) 300 MG/ML solution 100 mL (100 mLs Intravenous Contrast Given 05/09/20 0758)    ED Course  I have reviewed the triage vital signs and the nursing notes.  Pertinent labs & imaging results that were available during my care of the patient were reviewed by me and considered in my medical decision making (see chart for details).    MDM Rules/Calculators/A&P                          Additional history obtained from: 1. Nursing notes from this visit. 2. Review of EMR. ------------- 45 year old female presented for right lower quadrant pain onset 3 weeks ago patient is tender to palpation of the right lower quadrant positive Rovsing's and McBurney's point signs.  I have reviewed the labs sent which are obtained in triage and they show, normal CBC without leukocytosis or anemia.  Normal lipase.  Negative pregnancy test.  CMP which shows hyperglycemia, mild hyponatremia 134, no emergent electrolyte derangements, AKI, LFT elevations or gap.  Urinalysis shows greater than 500 glucose which explain the patient's history of diabetes, no evidence for UTI.  Shared decision-making was made with the patient, she is very concerned for possibility of appendicitis today, risks versus benefits of CT abdomen pelvis were explained to the patient she stated understanding, patient elects to proceed with CT abdomen pelvis for further evaluation of her right lower quadrant abdominal pain which I feel is reasonable given patient's tenderness to palpation of the right lower quadrant. - CT abdomen pelvis:  IMPRESSION:  1. Hepatic steatosis.    2. No bowel wall thickening or bowel obstruction. No periappendiceal  region inflammation. No abscess in the abdomen or pelvis.    3. No renal or ureteral calculus. No hydronephrosis. Urinary bladder   wall thickness within normal limits.   Work-up today is reassuring, vital signs stable.  On reassessment patient well-appearing no acute distress and is requesting discharge.  Suspect today that patient symptoms due  to musculoskeletal pain, no evidence for appendicitis, cholecystitis or other emergent intra-abdominal pathologies.  Additionally low suspicion for ovarian torsion as cause of her symptoms today.  Patient denies any chest pain or shortness of breath doubt PE or other cardiopulmonary pathologies at this time.  Encouraged water hydration, OTC anti-inflammatories and close PCP follow-up.  Patient reports that she has both OB/GYN and PCP follow-ups I have encouraged her to call those providers today to schedule follow-up appointments next week.  Patient did report that she is in menopause and recently began having a small amount of vaginal bleeding over the past few weeks, patient refused pelvic examination today and prefers to follow-up with her OB/GYN which I feel is appropriate.  Patient was updated all findings above including incidental findings and she states understanding and will discuss these with her primary care provider at follow-up visit.   I reviewed and interpreted labs which include: CBC, CMP, lipase, beta-hCG, urinalysis.  At this time there does not appear to be any evidence of an acute emergency medical condition and the patient appears stable for discharge with appropriate outpatient follow up. Diagnosis was discussed with patient who verbalizes understanding of care plan and is agreeable to discharge. I have discussed return precautions with patient who verbalizes understanding. Patient encouraged to follow-up with their PCP and OBGYN. All questions answered.  Patient's case discussed with Dr. Denton Lank who agrees with plan to discharge with PCP and OBGYN follow-up.   Spanish video interpreter was used throughout this visit.  Note: Portions of this report may have been  transcribed using voice recognition software. Every effort was made to ensure accuracy; however, inadvertent computerized transcription errors may still be present.  Final Clinical Impression(s) / ED Diagnoses Final diagnoses:  Right lower quadrant abdominal pain  Hepatic steatosis    Rx / DC Orders ED Discharge Orders    None       Elizabeth Palau 05/09/20 2725    Cathren Laine, MD 05/11/20 [phone]

## 2020-05-09 NOTE — Discharge Instructions (Addendum)
At this time there does not appear to be the presence of an emergent medical condition, however there is always the potential for conditions to change. Please read and follow the below instructions.  Please return to the Emergency Department immediately for any new or worsening symptoms. Please call your primary care provider today to schedule a follow-up appointment Please drink plenty of water and get plenty of rest. Please call your OB/GYN today to schedule follow-up appointment regarding your vaginal bleeding. Your CT scan today showed hepatic steatosis, degenerative changes of your lumbar spine and disc protrusion of your L1-L2.  Please discuss these findings with your primary care provider at your follow-up appointment.  Go to the nearest Emergency Department immediately if: You have fever or chills Your pain does not go away as soon as your doctor says it should. You vomit. Your pain is only in areas of your belly, such as the right side or the left lower part of the belly. You have bloody or black poop, or poop that looks like tar. You have very bad pain, cramping, or bloating in your belly. You have signs of not having enough fluid or water in your body (dehydration), such as: Dark pee, very little pee, or no pee. Cracked lips. Dry mouth. Sunken eyes. Sleepiness. Weakness. You have trouble breathing or chest pain. You have any new/concerning or worsening of symptoms.   Please read the additional information packets attached to your discharge summary.  Do not take your medicine if  develop an itchy rash, swelling in your mouth or lips, or difficulty breathing; call 911 and seek immediate emergency medical attention if this occurs.  You may review your lab tests and imaging results in their entirety on your MyChart account.  Please discuss all results of fully with your primary care provider and other specialist at your follow-up visit.  Note: Portions of this text may have  been transcribed using voice recognition software. Every effort was made to ensure accuracy; however, inadvertent computerized transcription errors may still be present. =============== Below has been translated using Google translate.  Errors may be present.  Interpret with caution.  A continuacin se ha traducido con Soil scientist. Puede haber errores. Interprete con precaucin. ============= En este momento no parece haber presencia de una condicin mdica emergente, sin embargo, siempre existe la posibilidad de que las condiciones Redmon. Lea y siga las instrucciones a continuacin.  1. Regrese al Departamento de Emergencias de inmediato si presenta sntomas nuevos o que empeoran. 2. Llame a su proveedor de atencin primaria hoy para programar una cita de seguimiento 3. Beba mucha agua y descanse lo suficiente. 4. Llame a su obstetra/gineclogo hoy para programar una cita de seguimiento con respecto a su sangrado vaginal. 5. Su tomografa computarizada de hoy mostr esteatosis heptica, cambios degenerativos en la columna lumbar y protrusin del disco L1-L2. Discuta estos hallazgos con su proveedor de atencin primaria en su cita de seguimiento.  Vaya al Departamento de Emergencias ms cercano de inmediato si: ? Tienes fiebre o escalofros 1. Su dolor no desaparece tan pronto como su mdico lo indique. 2. Vomitas. 3. Su dolor es solo en reas de su abdomen, como el lado derecho o la parte inferior izquierda del abdomen. 4. Tiene caca con sangre o negra, o caca que parece alquitrn. 5. Tiene dolor muy intenso, calambres o hinchazn en el abdomen. 6. Tiene signos de no tener suficiente lquido o agua en su cuerpo (deshidratacin), como: o Orina oscura, muy poca o  nada de [place]. o Labios agrietados. o Boca seca. o Ojos hundidos. o Somnolencia. o Debilidad. 7. Tiene dificultad para respirar o dolor en el pecho. 8. Tiene algn sntoma nuevo/preocupante o que empeora.   Lea  los paquetes de informacin adicional adjuntos a su resumen de alta.  No tome su medicamento si presenta sarpullido con picazn, hinchazn en la boca o los labios, o dificultad para respirar; llame al 911 y busque atencin mdica de emergencia inmediata si esto ocurre.  Puede revisar sus pruebas de laboratorio y Ashley de imgenes en su totalidad en su cuenta MyChart. Analice todos los resultados de forma completa con su proveedor de atencin primaria y otro especialista en su visita de seguimiento.  Nota: Es posible que partes de este texto se hayan transcrito con un software de Network engineer de voz. Se hizo todo lo posible para garantizar la precisin; sin embargo, an pueden estar presentes errores de transcripcin computarizados involuntarios.

## 2020-12-25 ENCOUNTER — Ambulatory Visit: Payer: Self-pay | Admitting: Internal Medicine

## 2020-12-29 ENCOUNTER — Ambulatory Visit: Payer: Self-pay | Admitting: Internal Medicine

## 2020-12-29 ENCOUNTER — Encounter: Payer: Self-pay | Admitting: Internal Medicine

## 2020-12-29 ENCOUNTER — Other Ambulatory Visit: Payer: Self-pay

## 2020-12-29 VITALS — BP 118/82 | HR 76 | Resp 16 | Ht 60.75 in | Wt 275.0 lb

## 2020-12-29 DIAGNOSIS — N760 Acute vaginitis: Secondary | ICD-10-CM

## 2020-12-29 DIAGNOSIS — Z6841 Body Mass Index (BMI) 40.0 and over, adult: Secondary | ICD-10-CM

## 2020-12-29 DIAGNOSIS — E119 Type 2 diabetes mellitus without complications: Secondary | ICD-10-CM

## 2020-12-29 MED ORDER — GLIPIZIDE 5 MG PO TABS: 5.0000 mg | ORAL_TABLET | Freq: Two times a day (BID) | ORAL | 11 refills | Status: DC

## 2020-12-29 MED ORDER — METFORMIN HCL ER 500 MG PO TB24: ORAL_TABLET | ORAL | 11 refills | Status: DC

## 2020-12-29 NOTE — Progress Notes (Signed)
° ° °  Subjective:    Patient ID: Kayla Scott, female   DOB: 04-18-1975, 45 y.o.   MRN: [id number]   HPI  Duayne Cal interprets  Here to be seen for what was thought to be an acute problem States has had vaginal complaints for a year.  Clarifies she has had this intermittently.  Is not currently having symptoms.  Uses Miconazole 3 days.  In fact, had symptoms last night and started the Miconazole and is not having symptoms.  Will go for a few days and then the irritation returns.    Describes vaginal dryness with itching, but no discharge.  Has the sensation the problem is on the outside, not the inside.    Patient denies polydipsia or polyuria, but do note during an ED visit in April, she had a blood glucose of 240 with 500 glucose in her urine.  States a doctor told her she needed to be on insulin and an oral pill.  She was anxious with using the insulin.  She eventually stopped taking the pill, which apparently was metformin.  Denies having throat swelling with Metformin.  She just felt anxious taking the Metformin.  Also always felt hungry with the Metformin and snacked more.   Stopped insulin after 2 months.  Stopped the Metformin after about 2 months.  Never followed up after she was started on meds as she became ill with COVID and then her doctor was ill with COvID.  Sounds like she was followed at [facility] [address] and [facility].    Current Meds  Medication Sig   CHROMIUM PICOLINATE PO Take by mouth daily.   TURMERIC CURCUMIN PO Take by mouth daily.  Kizzie Ide.  Allergies  Allergen Reactions   Metformin And Related Shortness Of Breath    Felt like throat was swelling     Review of Systems    Objective:   BP 118/82 (BP Location: Right Arm, Patient Position: Sitting, Cuff Size: Normal)    Pulse 76    Resp 16    Ht 5' 0.75" (1.543 m)    Wt 275 lb (124.7 kg)    LMP 05/02/2020    BMI 52.39 kg/m   Physical Exam Morbidly obes Lungs:  CTA CV:  RRR without  murmur or rub.  Radial pulses normal and equal No vaginal exam done due to obscuring cream and not having symptoms currently  Assessment & Plan   Recurrent vaginitis/yeast:  likely due to poorly controlled DM.  Discussed need to control to get vaginitis under control as well  2.  DM/obesity:  start Glipizide and Metformin ER twice daily.  A1C, CBC, CMP, FLP.  Encouraged pool exercise or recumbent bike as apparently has issues with knees.  Call if any problem with meds

## 2020-12-30 LAB — LIPID PANEL W/O CHOL/HDL RATIO
Cholesterol, Total: 147 mg/dL (ref 100–199)
HDL: 45 mg/dL (ref >39)
LDL Chol Calc (NIH): 76 mg/dL (ref 0–99)
Triglycerides: 151 mg/dL — ABNORMAL HIGH (ref 0–149)
VLDL Cholesterol Cal: 26 mg/dL (ref 5–40)

## 2020-12-30 LAB — CBC WITH DIFFERENTIAL/PLATELET
Basophils Absolute: 0 10*3/uL (ref 0.0–0.2)
Basos: 1 %
EOS (ABSOLUTE): 0.1 10*3/uL (ref 0.0–0.4)
Eos: 1 %
Hematocrit: 44 % (ref 34.0–46.6)
Hemoglobin: 14.5 g/dL (ref 11.1–15.9)
Immature Grans (Abs): 0 10*3/uL (ref 0.0–0.1)
Immature Granulocytes: 0 %
Lymphocytes Absolute: 2 10*3/uL (ref 0.7–3.1)
Lymphs: 23 %
MCH: 31.7 pg (ref 26.6–33.0)
MCHC: 33 g/dL (ref 31.5–35.7)
MCV: 96 fL (ref 79–97)
Monocytes Absolute: 0.6 10*3/uL (ref 0.1–0.9)
Monocytes: 7 %
Neutrophils Absolute: 6.2 10*3/uL (ref 1.4–7.0)
Neutrophils: 68 %
Platelets: 270 10*3/uL (ref 150–450)
RBC: 4.58 x10E6/uL (ref 3.77–5.28)
RDW: 12 % (ref 11.7–15.4)
WBC: 8.9 10*3/uL (ref 3.4–10.8)

## 2020-12-30 LAB — COMPREHENSIVE METABOLIC PANEL
ALT: 38 IU/L — ABNORMAL HIGH (ref 0–32)
AST: 23 IU/L (ref 0–40)
Albumin/Globulin Ratio: 1.1 — ABNORMAL LOW (ref 1.2–2.2)
Albumin: 3.5 g/dL — ABNORMAL LOW (ref 3.8–4.8)
Alkaline Phosphatase: 133 IU/L — ABNORMAL HIGH (ref 44–121)
BUN/Creatinine Ratio: 20 (ref 9–23)
BUN: 16 mg/dL (ref 6–24)
Bilirubin Total: 0.3 mg/dL (ref 0.0–1.2)
CO2: 25 mmol/L (ref 20–29)
Calcium: 8.8 mg/dL (ref 8.7–10.2)
Chloride: 99 mmol/L (ref 96–106)
Creatinine, Ser: 0.79 mg/dL (ref 0.57–1.00)
Globulin, Total: 3.3 g/dL (ref 1.5–4.5)
Glucose: 329 mg/dL — ABNORMAL HIGH (ref 70–99)
Potassium: 4.5 mmol/L (ref 3.5–5.2)
Sodium: 136 mmol/L (ref 134–144)
Total Protein: 6.8 g/dL (ref 6.0–8.5)
eGFR: 95 mL/min/{1.73_m2} (ref >59)

## 2020-12-30 LAB — HGB A1C W/O EAG: Hgb A1c MFr Bld: 12.6 % — ABNORMAL HIGH (ref 4.8–5.6)

## 2021-03-06 ENCOUNTER — Other Ambulatory Visit (INDEPENDENT_AMBULATORY_CARE_PROVIDER_SITE_OTHER): Payer: Self-pay

## 2021-03-06 ENCOUNTER — Other Ambulatory Visit: Payer: Self-pay

## 2021-03-06 DIAGNOSIS — R059 Cough, unspecified: Secondary | ICD-10-CM

## 2021-03-06 LAB — POCT INFLUENZA A/B
Influenza A, POC: NEGATIVE
Influenza B, POC: NEGATIVE

## 2021-03-06 LAB — POC COVID19 BINAXNOW: SARS Coronavirus 2 Ag: NEGATIVE

## 2021-03-07 ENCOUNTER — Encounter: Payer: Self-pay | Admitting: Internal Medicine

## 2021-03-07 ENCOUNTER — Other Ambulatory Visit: Payer: Self-pay

## 2021-03-07 ENCOUNTER — Ambulatory Visit: Payer: Self-pay | Admitting: Internal Medicine

## 2021-03-07 VITALS — BP 128/90 | HR 72 | Resp 12 | Ht 60.9 in | Wt 279.0 lb

## 2021-03-07 DIAGNOSIS — N760 Acute vaginitis: Secondary | ICD-10-CM

## 2021-03-07 DIAGNOSIS — M1711 Unilateral primary osteoarthritis, right knee: Secondary | ICD-10-CM

## 2021-03-07 DIAGNOSIS — E119 Type 2 diabetes mellitus without complications: Secondary | ICD-10-CM

## 2021-03-07 DIAGNOSIS — G8929 Other chronic pain: Secondary | ICD-10-CM | POA: Insufficient documentation

## 2021-03-07 DIAGNOSIS — Z23 Encounter for immunization: Secondary | ICD-10-CM

## 2021-03-07 MED ORDER — AGAMATRIX PRESTO W/DEVICE KIT: PACK | 0 refills | Status: DC

## 2021-03-07 MED ORDER — AGAMATRIX PRESTO TEST VI STRP: ORAL_STRIP | 11 refills | Status: DC

## 2021-03-07 MED ORDER — AGAMATRIX ULTRA-THIN LANCETS MISC: 11 refills | Status: DC

## 2021-03-07 MED ORDER — METFORMIN HCL ER 500 MG PO TB24: ORAL_TABLET | ORAL | 11 refills | Status: DC

## 2021-03-07 MED ORDER — GLIPIZIDE 10 MG PO TABS: ORAL_TABLET | ORAL | 11 refills | Status: DC

## 2021-03-07 MED ORDER — FLUCONAZOLE 150 MG PO TABS: ORAL_TABLET | ORAL | 0 refills | Status: DC

## 2021-03-07 MED ORDER — LORATADINE 10 MG PO TABS: 10.0000 mg | ORAL_TABLET | Freq: Every day | ORAL | 11 refills | Status: DC

## 2021-03-07 MED ORDER — KETOTIFEN FUMARATE 0.025 % OP SOLN: 1.0000 [drp] | Freq: Two times a day (BID) | OPHTHALMIC | 0 refills | Status: DC

## 2021-03-07 NOTE — Patient Instructions (Addendum)
°  [facility] Address: [address] [place], [place], [place] [address] Hours:  Open ? Closes 8?PM Phone: [phone]  [facility] [address], [place] [address]  [phone] Hours of Operation Mondays to Thursdays: 8 am to 8 pm Fridays: 9 am to 8 pm Saturdays: 9 am to 1 pm Sundays: Closed   Tome un vaso de agua antes de cada comida Tome un minimo de 6 a 8 vasos de agua diarios Coma tres veces al dia Coma una proteina y Neomia Dear grasa saludable con comida.  (huevos, pescado, pollo, pavo, y limite carnes rojas Coma 5 porciones diarias de legumbres.  Mezcle los colores Coma 2 porciones diarias de frutas con cascara cuando sea comestible Use platos pequeos Suelte su tenedor o cuchara despues de cada mordida hata que se mastique y se trague Come en la mesa con amigos o familiares por lo menos una vez al dia Apague la televisin y aparatos electrnicos durante la comida  Su objetivo debe ser perder una libra por semana  Estudios recientes indican que las personas quienes consumen todos de sus calorias durante 12 horas se bajan de pesocon Mas eficiencia.  Por ejemplo, si Usted come su primera comida a las 7:00 a.m., su comida final del dia se debe completar antes de las 7:00 p.m.

## 2021-03-07 NOTE — Progress Notes (Signed)
Patient has been experiencing nasal congestion and cough since 03/02/21. Wanted to get COVID and influenza test before her appointment 03/07/2021.  Covid and influenza test were negative. Patient was notified of results.

## 2021-03-07 NOTE — Progress Notes (Signed)
Subjective:    Patient ID: Kayla Scott, female   DOB: 1975-03-04, 46 y.o.   MRN: [id number]   HPI  Tildon Husky interprets  Here to formally establish--was seen acutely in December for recurrent vaginitis.   Recurrent Yeast vaginitis:  States has no improvement with intermittent topical use of Miconazole.  Was not having symptoms at time of visit in December.   Likely has not had DM under control as did not fill Glipizide.    2.  DM:  A1C was 12.6% 12/29/20.  She has only been taking Metformin 500 mg twice daily with meals.  Sounds like she originally did receive the glipizide for a month, but then the pharmacy did not fill again--may have not been able to give prescription # and the reason for not filling.   3.  Elevated LDL:  LDL and Trigs slightly above goals of below 70 and 150 respectively.   Lipid Panel     Component Value Date/Time   CHOL 147 12/29/2020 1728   TRIG 151 (H) 12/29/2020 1728   HDL 45 12/29/2020 1728   CHOLHDL 3.5 12/17/2017 0843   CHOLHDL 3.2 Ratio 03/23/2007 2048   VLDL 13 03/23/2007 2048   LDLCALC 76 12/29/2020 1728   LABVLDL 26 12/29/2020 1728    4.  Obesity:  Last visit, recommended finding water exercise or recumbent bike facility due to knee issues.  Did not pursue.  Just brings up her problem with knees as to why she is not being physically active.  Poor diet.  Breakfast:  milk and pan dulce coffee with milk  Lunch:  3 corn tortillas.  Chicken or other meat, rice.  soda  Dinner:  3 corn tortillas.  Chicken or other meat, rice.  Milk. Milk and banana as snack.  5.  History of allergies 4 years ago.  Symptoms of eye irritation, nose irritation with congestion, sneezing, and cough and ear pain starting 6 days ago.  Itching of eyes and nose.  No fever.  Tested negative today for COVID and Influenza.  Is using unknown eyedrops OTC.    6.  HM:  has not had covid booster, bivalent or otherwise.  Has not had pneumococcal.  Current Meds   Medication Sig   lidocaine (XYLOCAINE) 2 % jelly Apply to painful area as needed for bathing and urination pain   metFORMIN (GLUCOPHAGE-XR) 500 MG 24 hr tablet 1 tab by mouth twice daily with meals   No Known Allergies   Review of Systems    Objective:   BP 128/90 (BP Location: Left Arm, Patient Position: Sitting, Cuff Size: Normal)    Pulse 72    Resp 12    Ht 5' 0.9" (1.547 m)    Wt 279 lb (126.6 kg)    LMP 05/02/2020    BMI 52.89 kg/m   Physical Exam NAD HEENT:   PERRL, EOMI, watery with injection L>>R.  TMs pearly gray, throat not well visualized due to tongue. Neck:  Thick.  Supple, No adenopathy, not thyromegaly Chest:  CTA CV:  RRR with normal S1 and S2, No S3, S4 or murmur.  Radial and DP pulses normal and equal Abd:  S, NT, No HSM or mass, + BS LE:  No edema.   Assessment & Plan    Recurrent Yeast Vaginitis:  Fluconazole 150 mg daily for 3 days.  Control blood glucose with lifestyle changes and medication.  2.  DM:  increase Glipizide to 10 mg twice daily and Metformin  ER to 1000 mg twice daily.  Monitor glucose--supplies to [facility].    3.  Borderline diastolic BP:  check urine microalbumin/crea--consider addition of low dose ACE I if elevated and if BP remains elevated.  4.  Allergies vs viral URI:  Loratadine and Zaditor OTC  5.  Elevated LDL/Trigs:  long discussion regarding lifestyle changes for entire family--daughter overweight and husband's side of family with DM as well.  6.  DJD, Right knee: water exercise or recumbent bike.  To make goals with exercise and with diet.    7.  HM:  pneumococcal 23 and Moderna bivalent vaccines

## 2021-03-08 LAB — MICROALBUMIN / CREATININE URINE RATIO
Creatinine, Urine: 69.4 mg/dL
Microalb/Creat Ratio: 4 mg/g creat (ref 0–29)
Microalbumin, Urine: 3.1 ug/mL

## 2021-04-13 ENCOUNTER — Ambulatory Visit: Payer: Self-pay

## 2021-04-13 VITALS — BP 124/84 | HR 84

## 2021-04-13 DIAGNOSIS — Z0130 Encounter for examination of blood pressure without abnormal findings: Secondary | ICD-10-CM

## 2021-04-24 NOTE — Progress Notes (Signed)
After reporting bp to Dr Delrae Alfred, there will be no changes to medications. ?

## 2021-06-01 ENCOUNTER — Other Ambulatory Visit: Payer: Self-pay

## 2021-06-05 ENCOUNTER — Ambulatory Visit: Payer: Self-pay | Admitting: Internal Medicine

## 2021-07-02 ENCOUNTER — Other Ambulatory Visit: Payer: Self-pay

## 2021-07-09 ENCOUNTER — Other Ambulatory Visit: Payer: Self-pay

## 2021-07-09 DIAGNOSIS — E119 Type 2 diabetes mellitus without complications: Secondary | ICD-10-CM

## 2021-07-10 LAB — LIPID PANEL W/O CHOL/HDL RATIO
Cholesterol, Total: 152 mg/dL (ref 100–199)
HDL: 49 mg/dL (ref >39)
LDL Chol Calc (NIH): 81 mg/dL (ref 0–99)
Triglycerides: 125 mg/dL (ref 0–149)
VLDL Cholesterol Cal: 22 mg/dL (ref 5–40)

## 2021-07-10 LAB — HEMOGLOBIN A1C
Est. average glucose Bld gHb Est-mCnc: 269 mg/dL
Hgb A1c MFr Bld: 11 % — ABNORMAL HIGH (ref 4.8–5.6)

## 2021-07-24 ENCOUNTER — Ambulatory Visit: Payer: Self-pay | Admitting: Internal Medicine

## 2021-08-08 ENCOUNTER — Encounter (HOSPITAL_COMMUNITY): Payer: Self-pay | Admitting: Emergency Medicine

## 2021-08-08 ENCOUNTER — Ambulatory Visit (HOSPITAL_COMMUNITY)
Admission: EM | Admit: 2021-08-08 | Discharge: 2021-08-08 | Disposition: A | Discharge status: Home / Self Care | Payer: Self-pay | Attending: Emergency Medicine | Admitting: Emergency Medicine

## 2021-08-08 DIAGNOSIS — J069 Acute upper respiratory infection, unspecified: Secondary | ICD-10-CM | POA: Insufficient documentation

## 2021-08-08 DIAGNOSIS — U071 COVID-19: Secondary | ICD-10-CM | POA: Insufficient documentation

## 2021-08-08 MED ORDER — ALBUTEROL SULFATE HFA 108 (90 BASE) MCG/ACT IN AERS: 2.0000 | INHALATION_SPRAY | RESPIRATORY_TRACT | 0 refills | Status: DC | PRN

## 2021-08-08 MED ORDER — PREDNISONE 20 MG PO TABS: 40.0000 mg | ORAL_TABLET | Freq: Every day | ORAL | 0 refills | Status: DC

## 2021-08-08 NOTE — ED Provider Notes (Signed)
[facility]    CSN: [id number] Arrival date & time: 08/08/21  1107      History   Chief Complaint Chief Complaint  Patient presents with   Generalized Body Aches   Dental Pain    HPI Kayla Scott is a 46 y.o. female.   Patient presents with fever, chills, body aches, nasal congestion, bilateral ear fullness, rhinorrhea, generalized headaches sore throat, nonproductive cough, intermittent shortness of breath with exertion and wheezing for 4 days.  Endorsing gum swelling as well, this is this began after having COVID over a year ago.  Decreased appetite but tolerating food and liquids.  No known sick contacts.  History of diabetes and obesity.  Does not have a dentist.  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 Duaine Dredge office.  Reportedly recommended surgery, but could not afford.   UTI (lower urinary tract infection)     Patient Active Problem List   Diagnosis Date Noted   Osteoarthritis of right knee 03/07/2021   Diabetes mellitus without complication ([place]) 54/98/2641   Recurrent vaginitis 12/29/2020   Dermatitis 03/28/2017   Left lower quadrant pain 03/28/2017   Morbid obesity ([place]) 03/28/2017   S/P C-section 10/18/2014    Past Surgical History:  Procedure Laterality Date   CESAREAN SECTION N/A 11/09/1998   [place]   CESAREAN SECTION N/A 07/24/2001   [facility]  Premature delivery due to placenta previa   CESAREAN SECTION WITH BILATERAL TUBAL LIGATION Bilateral 10/18/2014   Procedure: REPEAT CESAREAN SECTION WITH BILATERAL TUBAL LIGATION;  Surgeon: Woodroe Mode, MD;  Location: [facility];  Service: Obstetrics;  Laterality: Bilateral;    OB History     Gravida  8   Para  5   Term  4   Preterm  1   AB  3   Living  5       SAB  3   IAB      Ectopic      Multiple  0   Live Births  5            Home Medications    Prior to Admission medications   Medication Sig Start Date End Date Taking? Authorizing Provider  AgaMatrix Ultra-Thin Lancets MISC Check blood glucose twice daily before meals 03/07/21   Mack Hook, MD  Blood Glucose Monitoring Suppl (AGAMATRIX PRESTO) w/Device KIT Check blood glucose before meals twice daily 03/07/21   Mack Hook, MD  fluconazole (DIFLUCAN) 150 MG tablet 1 tab by mouth daily for 3 days. 03/07/21   Mack Hook, MD  glipiZIDE (GLUCOTROL) 10 MG tablet 1 tab by mouth with meal twice daily 03/07/21   Mack Hook, MD  glucose blood (AGAMATRIX PRESTO TEST) test strip Check blood glucose before meals twice daily 03/07/21   Mack Hook, MD  ketotifen (ZADITOR) 0.025 % ophthalmic solution Place 1 drop into both eyes 2 (two) times daily. 03/07/21   Mack Hook, MD  lidocaine (XYLOCAINE) 2 % jelly Apply to painful area as needed for bathing and urination pain 06/03/19   Raylene Everts, MD  loratadine (CLARITIN) 10 MG tablet Take 1 tablet (10 mg total) by mouth daily. 03/07/21   Mack Hook, MD  metFORMIN (GLUCOPHAGE-XR) 500 MG 24 hr tablet 2 tabs by mouth twice  daily with meals 03/07/21   Mack Hook, MD    Family History Family History  Problem Relation Age of Onset   Diabetes Mother    Colitis Mother    Diabetes Sister    Diabetes Brother    Cancer Paternal Grandmother        uterine    Social History Social History   Tobacco Use   Smoking status: Never   Smokeless tobacco: Never  Vaping Use   Vaping Use: Never used  Substance Use Topics   Alcohol use: No   Drug use: No     Allergies   Patient has no known allergies.   Review of Systems Review of Systems  Constitutional:  Positive for chills and fever. Negative for activity change, appetite change, diaphoresis, fatigue and unexpected  weight change.  HENT:  Positive for congestion, ear pain, rhinorrhea and sore throat. Negative for dental problem, drooling, ear discharge, facial swelling, hearing loss, mouth sores, nosebleeds, postnasal drip, sinus pressure, sinus pain, sneezing, tinnitus, trouble swallowing and voice change.   Respiratory:  Positive for cough, shortness of breath and wheezing. Negative for apnea, choking, chest tightness and stridor.   Cardiovascular: Negative.   Gastrointestinal: Negative.   Musculoskeletal:  Positive for myalgias. Negative for arthralgias, back pain, gait problem, joint swelling, neck pain and neck stiffness.  Skin: Negative.   Neurological:  Positive for headaches. Negative for dizziness, tremors, seizures, syncope, facial asymmetry, speech difficulty, weakness, light-headedness and numbness.     Physical Exam Triage Vital Signs ED Triage Vitals [08/08/21 1122]  Enc Vitals Group     BP 93/76     Pulse Rate (!) 108     Resp (!) 26     Temp 99.9 F (37.7 C)     Temp Source Oral     SpO2 100 %     Weight      Height      Head Circumference      Peak Flow      Pain Score 10     Pain Loc      Pain Edu?      Excl. in [place]?    No data found.  Updated Vital Signs BP 93/76 (BP Location: Right Arm)   Pulse (!) 108   Temp 99.9 F (37.7 C) (Oral)   Resp (!) 26   LMP 05/02/2020   SpO2 100%   Visual Acuity Right Eye Distance:   Left Eye Distance:   Bilateral Distance:    Right Eye Near:   Left Eye Near:    Bilateral Near:     Physical Exam Constitutional:      Appearance: Normal appearance.  HENT:     Head: Normocephalic.     Right Ear: Tympanic membrane, ear canal and external ear normal.     Left Ear: Tympanic membrane, ear canal and external ear normal.     Nose: Congestion and rhinorrhea present.     Mouth/Throat:     Mouth: Mucous membranes are moist.     Comments: Mild to moderate gingival swelling throughout gumline, no prominent dental decay or abscess  noted Eyes:     Extraocular Movements: Extraocular movements intact.  Cardiovascular:     Rate and Rhythm: Normal rate and regular rhythm.     Pulses: Normal pulses.     Heart sounds: Normal heart sounds.  Pulmonary:     Effort: Pulmonary effort is normal.     Breath sounds: Normal breath sounds.  Musculoskeletal:  Cervical back: Normal range of motion and neck supple.  Skin:    General: Skin is warm and dry.  Neurological:     Mental Status: She is alert and oriented to person, place, and time. Mental status is at baseline.  Psychiatric:        Mood and Affect: Mood normal.        Behavior: Behavior normal.      UC Treatments / Results  Labs (all labs ordered are listed, but only abnormal results are displayed) Labs Reviewed - No data to display  EKG   Radiology No results found.  Procedures Procedures (including critical care time)  Medications Ordered in UC Medications - No data to display  Initial Impression / Assessment and Plan / UC Course  I have reviewed the triage vital signs and the nursing notes.  Pertinent labs & imaging results that were available during my care of the patient were reviewed by me and considered in my medical decision making (see chart for details).  Viral URI  Low-grade fever of 99.9 with associated tachycardia noted in triage, while ill-appearing patient is in no signs of distress, etiology is most likely viral, COVID test is pending and if positive may use antiviral, discussed quarantine , discussed with patient, prescribed prednisone 40 mg burst to help with the breath, wheezing, body aches and gingival swelling, albuterol prescribed for additional support as well, may use over-the-counter medications for remaining symptoms with follow-up with urgent care if symptoms persist or worsen Final Clinical Impressions(s) / UC Diagnoses   Final diagnoses:  None   Discharge Instructions   None    ED Prescriptions   None    PDMP  not reviewed this encounter.   Hans Eden, NP 08/08/21 1150

## 2021-08-08 NOTE — ED Triage Notes (Signed)
Pt c/o muscle and joint pains, dental swelling, bilat ears and little throat pain since Sunday taking tylenol for pains.

## 2021-08-08 NOTE — Discharge Instructions (Signed)
Your symptoms today are most likely being caused by a virus and should steadily improve in time it can take up to 7 to 10 days before you truly start to see a turnaround however things will get better  Take prednisone every morning with food for the next 5 days, this medicine will temporarily increase your blood sugars, they will return to normal once medication is stopped, this medicine is being used to help reduce inflammation throughout your body which ideally will give you some form of comfort  May use albuterol inhaler taking 2 puffs every 4 hours when feeling short of breath or wheezing    You can take Tylenol and/or Ibuprofen as needed for fever reduction and pain relief.   For cough: honey 1/2 to 1 teaspoon (you can dilute the honey in water or another fluid).  You can also use guaifenesin and dextromethorphan for cough. You can use a humidifier for chest congestion and cough.  If you don't have a humidifier, you can sit in the bathroom with the hot shower running.      For sore throat: try warm salt water gargles, cepacol lozenges, throat spray, warm tea or water with lemon/honey, popsicles or ice, or OTC cold relief medicine for throat discomfort.   For congestion: take a daily anti-histamine like Zyrtec, Claritin, and a oral decongestant, such as pseudoephedrine.  You can also use Flonase 1-2 sprays in each nostril daily.   It is important to stay hydrated: drink plenty of fluids (water, gatorade/powerade/pedialyte, juices, or teas) to keep your throat moisturized and help further relieve irritation/discomfort.

## 2021-08-09 ENCOUNTER — Telehealth (HOSPITAL_COMMUNITY): Payer: Self-pay | Admitting: Emergency Medicine

## 2021-08-09 LAB — SARS CORONAVIRUS 2 (TAT 6-24 HRS): SARS Coronavirus 2: POSITIVE — AB

## 2021-08-09 MED ORDER — MOLNUPIRAVIR EUA 200MG CAPSULE: 4.0000 | ORAL_CAPSULE | Freq: Two times a day (BID) | ORAL | 0 refills | Status: AC

## 2021-09-10 ENCOUNTER — Other Ambulatory Visit: Payer: Self-pay | Admitting: Internal Medicine

## 2021-09-10 ENCOUNTER — Encounter: Payer: Self-pay | Admitting: Internal Medicine

## 2021-09-10 ENCOUNTER — Ambulatory Visit: Payer: Self-pay | Admitting: Internal Medicine

## 2021-09-10 VITALS — BP 102/70 | HR 92 | Resp 16 | Ht 60.25 in | Wt 278.0 lb

## 2021-09-10 DIAGNOSIS — M549 Dorsalgia, unspecified: Secondary | ICD-10-CM

## 2021-09-10 DIAGNOSIS — K0889 Other specified disorders of teeth and supporting structures: Secondary | ICD-10-CM

## 2021-09-10 DIAGNOSIS — E119 Type 2 diabetes mellitus without complications: Secondary | ICD-10-CM

## 2021-09-10 DIAGNOSIS — Z124 Encounter for screening for malignant neoplasm of cervix: Secondary | ICD-10-CM

## 2021-09-10 DIAGNOSIS — N898 Other specified noninflammatory disorders of vagina: Secondary | ICD-10-CM

## 2021-09-10 DIAGNOSIS — Z0000 Encounter for general adult medical examination without abnormal findings: Secondary | ICD-10-CM

## 2021-09-10 DIAGNOSIS — E7800 Pure hypercholesterolemia, unspecified: Secondary | ICD-10-CM

## 2021-09-10 DIAGNOSIS — Z1231 Encounter for screening mammogram for malignant neoplasm of breast: Secondary | ICD-10-CM

## 2021-09-10 LAB — POCT WET PREP WITH KOH
Clue Cells Wet Prep HPF POC: NEGATIVE
KOH Prep POC: NEGATIVE
RBC Wet Prep HPF POC: NEGATIVE
Trichomonas, UA: NEGATIVE

## 2021-09-10 MED ORDER — FLUCONAZOLE 150 MG PO TABS: ORAL_TABLET | ORAL | 0 refills | Status: DC

## 2021-09-10 MED ORDER — GLIPIZIDE 10 MG PO TABS: ORAL_TABLET | ORAL | 11 refills | Status: DC

## 2021-09-10 MED ORDER — ATORVASTATIN CALCIUM 20 MG PO TABS: 20.0000 mg | ORAL_TABLET | Freq: Every day | ORAL | 0 refills | Status: DC

## 2021-09-10 MED ORDER — ATORVASTATIN CALCIUM 20 MG PO TABS: ORAL_TABLET | ORAL | 11 refills | Status: DC

## 2021-09-10 MED ORDER — GLIPIZIDE 10 MG PO TABS
10.0000 mg | ORAL_TABLET | Freq: Two times a day (BID) | ORAL | 0 refills | Status: DC
Start: 2021-09-10 — End: 2022-02-25

## 2021-09-10 NOTE — Progress Notes (Signed)
Subjective:    Patient ID: Kayla Scott, female   DOB: 1975/08/02, 46 y.o.   MRN: [id number]   HPI  Kayla Scott interprets  CPE with pap  1.  Pap: Last in 2019 and normal.  Never abnormal.    2.  Mammogram:  Never.  No family history of breast cancer.    3.  Osteoprevention:  Drinks once serving daily and willing to increase to 3-4 servings.  She does not walk, but her youngest is now heading off to school and plans to get started.   4.  Guaiac Cards/FIT:  Last performed 2019 and negative.   5.  Colonoscopy:  Never.  No family history of colon cancer.    6.  Immunizations:  7.  Glucose/Cholesterol:  A1C down to 11.0% back in June.  Cholesterol LDL higher last check end of June at 58.  She is not taking any statin or other cholesterol lowering med and has not in past.   Lipid Panel     Component Value Date/Time   CHOL 152 07/09/2021 0852   TRIG 125 07/09/2021 0852   HDL 49 07/09/2021 0852   CHOLHDL 3.5 12/17/2017 0843   CHOLHDL 3.2 Ratio 03/23/2007 2048   VLDL 13 03/23/2007 2048   LDLCALC 81 07/09/2021 0852   LABVLDL 22 07/09/2021 0852     Current Meds  Medication Sig   albuterol (VENTOLIN HFA) 108 (90 Base) MCG/ACT inhaler Inhale 2 puffs into the lungs every 4 (four) hours as needed for wheezing or shortness of breath.   ibuprofen (ADVIL) 200 MG tablet Take 200 mg by mouth every 6 (six) hours as needed.   loratadine (CLARITIN) 10 MG tablet Take 1 tablet (10 mg total) by mouth daily.   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 Duaine Dredge office.  Reportedly recommended surgery, but could not afford.   UTI (lower urinary tract infection)    Past Surgical History:   Procedure Laterality Date   CESAREAN SECTION N/A 11/09/1998   [place]   CESAREAN SECTION N/A 07/24/2001   [facility].  Premature delivery due to placenta previa   CESAREAN SECTION WITH BILATERAL TUBAL LIGATION Bilateral 10/18/2014   Procedure: REPEAT CESAREAN SECTION WITH BILATERAL TUBAL LIGATION;  Surgeon: Woodroe Mode, MD;  Location: [facility];  Service: Obstetrics;  Laterality: Bilateral;   Family History  Problem Relation Age of Onset   Diabetes Mother    Colitis Mother    Diabetes Sister    Diabetes Brother    Cancer Paternal Grandmother        uterine   Social History   Socioeconomic History   Marital status: Married    Spouse name: Curly Rim   Number of children: 5   Years of education: 12   Highest education level: High school graduate  Occupational History   Occupation: Housewife    Comment: Previously cleaned at [employer] Use   Smoking status: Never    Passive exposure: Never   Smokeless tobacco: Never  Vaping Use   Vaping Use: Never used  Substance and Sexual Activity   Alcohol use: No   Drug use: No  Sexual activity: Yes    Birth control/protection: Surgical  Other Topics Concern   Not on file  Social History Narrative   Lives at home with husband (brother to another patient) and 2 youngest children   Originally from [place].    Came to [place] in [place]   Social Determinants of Health   Financial Resource Strain: Low Risk  (09/10/2021)   Overall Financial Resource Strain (CARDIA)    Difficulty of Paying Living Expenses: Not hard at all  Food Insecurity: No Food Insecurity (09/10/2021)   Hunger Vital Sign    Worried About Running Out of Food in the Last Year: Never true    [place] in the Last Year: Never true  Transportation Needs: No Transportation Needs (09/10/2021)   PRAPARE - Hydrologist (Medical): No    Lack of Transportation (Non-Medical): No  Physical Activity: Not on file  Stress:  Not on file  Social Connections: Not on file  Intimate Partner Violence: Not At Risk (09/10/2021)   Humiliation, Afraid, Rape, and Kick questionnaire    Fear of Current or Ex-Partner: No    Emotionally Abused: No    Physically Abused: No    Sexually Abused: No      Review of Systems  HENT:  Positive for dental problem (Central bottom incisors very loose--pop forward and she has to pop them back in place.  Can happen even with something as little as brushing teeth. They are discolored.).   Eyes:  Negative for visual disturbance (Has not had diabetic eye check in past year.).  Respiratory:  Negative for shortness of breath.   Cardiovascular:  Positive for leg swelling (But this has improved). Negative for chest pain and palpitations.  Genitourinary:  Negative for menstrual problem.       Vaginal itching for 2 years.  Has only had white discharge for 1 week.   No odor.  She is sexually active.  Last was 3 weeks ago.  Fluconazole has helped in recent past, but then recurred.    Musculoskeletal:        Left upper arm and shoulder pain for past 2 weeks.  No history of injury.  States the pain comes on with any sudden movement or sometimes just starts to hurt while at rest.  Feels like her shoulder I "out of joint"  Not a burning or numbness.  Describes like a hard cramp. States does not hurt to sleep on that side.  Has not awakened her from sleep ever.   Pain lasts about 3 minutes--sometimes so strong, can take her "breath away" No associated chest pain No radiation to jaw as well, though pain can radiate to shoulder blade..    Neurological:  Negative for weakness and numbness.      Objective:   BP 102/70 (BP Location: Right Arm, Patient Position: Sitting, Cuff Size: Large)   Pulse 92   Resp 16   Ht 5' 0.25" (1.53 m)   Wt 278 lb (126.1 kg)   LMP 05/02/2020   BMI 53.84 kg/m   Physical Exam Constitutional:      Appearance: She is obese.  HENT:     Head: Normocephalic and  atraumatic.     Right Ear: Tympanic membrane, ear canal and external ear normal.     Left Ear: Tympanic membrane, ear canal and external ear normal.     Mouth/Throat:     Mouth: Mucous membranes are moist.     Pharynx: Oropharynx  is clear.  Eyes:     Extraocular Movements: Extraocular movements intact.     Conjunctiva/sclera: Conjunctivae normal.     Pupils: Pupils are equal, round, and reactive to light.     Comments: Discs sharp  Neck:     Thyroid: No thyroid mass or thyromegaly.  Cardiovascular:     Rate and Rhythm: Normal rate and regular rhythm.     Pulses:          Dorsalis pedis pulses are 2+ on the right side and 2+ on the left side.       Posterior tibial pulses are 2+ on the right side and 2+ on the left side.     Heart sounds: S1 normal and S2 normal. No murmur heard.    No friction rub. No S3 or S4 sounds.     Comments: No carotid bruits.  Carotid, radial, femoral, DP and PT pulses normal and equal.   Pulmonary:     Effort: Pulmonary effort is normal.     Breath sounds: Normal breath sounds and air entry.  Chest:  Breasts:    Right: No inverted nipple, mass or nipple discharge.     Left: No inverted nipple, mass or nipple discharge.  Abdominal:     General: Bowel sounds are normal.     Palpations: Abdomen is soft. There is no hepatomegaly, splenomegaly or mass.     Tenderness: There is no abdominal tenderness.     Hernia: No hernia is present.  Genitourinary:    Comments: Minimal white discharge.  Minimal vaginal mucosa inflammation. Cervix with adherent white patches No uterine or adnexal mass.  Mild tenderness on bimanual exam.  No CMT.    Musculoskeletal:     Cervical back: Normal range of motion and neck supple.     Thoracic back: Spasms (Left trap and upper thoracic paraspinous musculature) and tenderness present. Normal range of motion (But pain with shoulder movement.).  Feet:     Right foot:     Protective Sensation: 10 sites tested.  10 sites sensed.      Skin integrity: Skin integrity normal.     Toenail Condition: Right toenails are normal.     Left foot:     Protective Sensation: 10 sites tested.  10 sites sensed.     Skin integrity: Skin integrity normal.     Toenail Condition: Left toenails are normal.  Lymphadenopathy:     Head:     Right side of head: No submental or submandibular adenopathy.     Left side of head: No submental or submandibular adenopathy.     Cervical: No cervical adenopathy.     Upper Body:     Right upper body: No supraclavicular or axillary adenopathy.     Left upper body: No supraclavicular or axillary adenopathy.     Lower Body: No right inguinal adenopathy. No left inguinal adenopathy.  Skin:    General: Skin is warm.     Capillary Refill: Capillary refill takes less than 2 seconds.     Findings: No rash.  Neurological:     General: No focal deficit present.     Mental Status: She is alert and oriented to person, place, and time.     Cranial Nerves: Cranial nerves 2-12 are intact.     Sensory: Sensation is intact.     Motor: Motor function is intact.     Coordination: Coordination is intact.     Gait: Gait is intact.  Deep Tendon Reflexes: Reflexes are normal and symmetric.  Psychiatric:        Behavior: Behavior normal. Behavior is cooperative.      Assessment & Plan    CPE with pap Mammogram ordered--baseline FIT to return in 2 weeks.    2.  Yeast vaginitis: discussed needs to get blood glucose under control.  Encouraged monitoring blood glucose.  Fluconazole 150 mg once daily for 3 days.    3.  Elevated LDL:  atorvastatin 20 mg daily.  4.  DM:  poor control:  Prescriptions for Glipizide and Metformin written, to take both.  Referral for Diabetic eye exam.  5.  Dental pain:  Dental clinic referral.    6.  Left upper back/trap pain:  referral to [facility].

## 2021-09-11 LAB — CYTOLOGY - PAP

## 2021-09-12 ENCOUNTER — Other Ambulatory Visit: Payer: Self-pay

## 2021-09-12 MED ORDER — ATORVASTATIN CALCIUM 20 MG PO TABS: 20.0000 mg | ORAL_TABLET | Freq: Every day | ORAL | 0 refills | Status: DC

## 2021-09-12 MED ORDER — FLUCONAZOLE 150 MG PO TABS: ORAL_TABLET | ORAL | 0 refills | Status: DC

## 2021-09-14 ENCOUNTER — Other Ambulatory Visit: Payer: Self-pay

## 2022-01-09 ENCOUNTER — Emergency Department (HOSPITAL_BASED_OUTPATIENT_CLINIC_OR_DEPARTMENT_OTHER): Payer: Self-pay

## 2022-01-09 ENCOUNTER — Emergency Department (HOSPITAL_BASED_OUTPATIENT_CLINIC_OR_DEPARTMENT_OTHER)
Admission: EM | Admit: 2022-01-09 | Discharge: 2022-01-09 | Disposition: A | Discharge status: Home / Self Care | Payer: Self-pay | Attending: Emergency Medicine | Admitting: Emergency Medicine

## 2022-01-09 ENCOUNTER — Other Ambulatory Visit: Payer: Self-pay

## 2022-01-09 ENCOUNTER — Encounter (HOSPITAL_BASED_OUTPATIENT_CLINIC_OR_DEPARTMENT_OTHER): Payer: Self-pay

## 2022-01-09 DIAGNOSIS — K1121 Acute sialoadenitis: Secondary | ICD-10-CM | POA: Insufficient documentation

## 2022-01-09 DIAGNOSIS — Z7951 Long term (current) use of inhaled steroids: Secondary | ICD-10-CM | POA: Insufficient documentation

## 2022-01-09 LAB — CBC
HCT: 41.8 % (ref 36.0–46.0)
Hemoglobin: 14.7 g/dL (ref 12.0–15.0)
MCH: 32.4 pg (ref 26.0–34.0)
MCHC: 35.2 g/dL (ref 30.0–36.0)
MCV: 92.1 fL (ref 80.0–100.0)
Platelets: 221 10*3/uL (ref 150–400)
RBC: 4.54 MIL/uL (ref 3.87–5.11)
RDW: 12.9 % (ref 11.5–15.5)
WBC: 7.9 10*3/uL (ref 4.0–10.5)
nRBC: 0 % (ref 0.0–0.2)

## 2022-01-09 LAB — BASIC METABOLIC PANEL
Anion gap: 14 (ref 5–15)
BUN: 10 mg/dL (ref 6–20)
CO2: 21 mmol/L — ABNORMAL LOW (ref 22–32)
Calcium: 8.7 mg/dL — ABNORMAL LOW (ref 8.9–10.3)
Chloride: 99 mmol/L (ref 98–111)
Creatinine, Ser: 0.72 mg/dL (ref 0.44–1.00)
GFR, Estimated: >60 mL/min (ref >60)
Glucose, Bld: 342 mg/dL — ABNORMAL HIGH (ref 70–99)
Potassium: 3.3 mmol/L — ABNORMAL LOW (ref 3.5–5.1)
Sodium: 134 mmol/L — ABNORMAL LOW (ref 135–145)

## 2022-01-09 LAB — GROUP A STREP BY PCR: Group A Strep by PCR: NOT DETECTED

## 2022-01-09 MED ORDER — IOHEXOL 300 MG/ML  SOLN
100.0000 mL | Freq: Once | INTRAMUSCULAR | Status: AC | PRN
  Administered 2022-01-09: 75 mL via INTRAVENOUS

## 2022-01-09 MED ORDER — ONDANSETRON HCL 4 MG/2ML IJ SOLN
4.0000 mg | Freq: Once | INTRAMUSCULAR | Status: AC
  Administered 2022-01-09: 4 mg via INTRAVENOUS
  Filled 2022-01-09: qty 2

## 2022-01-09 MED ORDER — HYDROMORPHONE HCL 1 MG/ML IJ SOLN
1.0000 mg | Freq: Once | INTRAMUSCULAR | Status: AC
  Administered 2022-01-09: 1 mg via INTRAVENOUS
  Filled 2022-01-09: qty 1

## 2022-01-09 MED ORDER — AMOXICILLIN-POT CLAVULANATE 875-125 MG PO TABS
1.0000 | ORAL_TABLET | Freq: Once | ORAL | Status: AC
  Administered 2022-01-09: 1 via ORAL
  Filled 2022-01-09: qty 1

## 2022-01-09 MED ORDER — AMOXICILLIN-POT CLAVULANATE 875-125 MG PO TABS: 1.0000 | ORAL_TABLET | Freq: Two times a day (BID) | ORAL | 0 refills | Status: DC

## 2022-01-09 MED ORDER — SODIUM CHLORIDE 0.9 % IV BOLUS
1000.0000 mL | Freq: Once | INTRAVENOUS | Status: AC
  Administered 2022-01-09: 1000 mL via INTRAVENOUS

## 2022-01-09 NOTE — ED Provider Notes (Signed)
[place] EMERGENCY DEPT Provider Note   CSN: [id number] Arrival date & time: 01/09/22  1022     History  Chief Complaint  Patient presents with   Facial Swelling   HPI Kayla Scott is a 46 y.o. female with diabetes presenting for facial swelling.  Noticed swelling starting on December 25.  Located on the left side below her jaw and extending down into her left neck.  Feels solid.  It is painful.  She is endorsing trouble swallowing, breathing.  States she is drooling at times.  States she can only tolerate drinking thin liquids at this time.  Has not eaten in a couple of days.  States she did have a fever at home.  Started having sore throat in the last couple days.  Has been taking Advil and Tylenol for symptoms.  HPI     Home Medications Prior to Admission medications   Medication Sig Start Date End Date Taking? Authorizing Provider  amoxicillin-clavulanate (AUGMENTIN) 875-125 MG tablet Take 1 tablet by mouth every 12 (twelve) hours. 01/09/22  Yes Harriet Pho, PA-C  AgaMatrix Ultra-Thin Lancets MISC Check blood glucose twice daily before meals Patient not taking: Reported on 09/10/2021 03/07/21   Mack Hook, MD  albuterol (VENTOLIN HFA) 108 (90 Base) MCG/ACT inhaler Inhale 2 puffs into the lungs every 4 (four) hours as needed for wheezing or shortness of breath. 08/08/21   Hans Eden, NP  atorvastatin (LIPITOR) 20 MG tablet 1 tab by mouth daily with evening meal 09/10/21   Mack Hook, MD  atorvastatin (LIPITOR) 20 MG tablet Take 1 tablet (20 mg total) by mouth daily. 09/12/21   Mack Hook, MD  Blood Glucose Monitoring Suppl (AGAMATRIX PRESTO) w/Device KIT Check blood glucose before meals twice daily Patient not taking: Reported on 09/10/2021 03/07/21   Mack Hook, MD  fluconazole (DIFLUCAN) 150 MG tablet 1 tab by mouth daily for 3 days. 09/12/21   Mack Hook, MD  glipiZIDE (GLUCOTROL) 10 MG tablet 1 tab  by mouth twice daily with meals 09/10/21   Mack Hook, MD  glipiZIDE (GLUCOTROL) 10 MG tablet Take 1 tablet (10 mg total) by mouth 2 (two) times daily before a meal. 09/10/21   Mack Hook, MD  glucose blood (AGAMATRIX PRESTO TEST) test strip Check blood glucose before meals twice daily Patient not taking: Reported on 09/10/2021 03/07/21   Mack Hook, MD  ibuprofen (ADVIL) 200 MG tablet Take 200 mg by mouth every 6 (six) hours as needed.    [provider]  loratadine (CLARITIN) 10 MG tablet Take 1 tablet (10 mg total) by mouth daily. 03/07/21   Mack Hook, MD  metFORMIN (GLUCOPHAGE-XR) 500 MG 24 hr tablet 2 tabs by mouth twice daily with meals 03/07/21   Mack Hook, MD      Allergies    Patient has no known allergies.    Review of Systems   Review of Systems  HENT:  Positive for facial swelling and trouble swallowing.      Physical Exam   Vitals:   01/09/22 1215 01/09/22 1315  BP: 115/82 112/75  Pulse: (!) 103 85  Resp: 16 (!) 24  Temp:    SpO2: 100% 92%    CONSTITUTIONAL:  ill-appearing, NAD NEURO:  Alert and oriented x 3, CN 3-12 grossly intact EYES:  eyes equal and reactive ENT/NECK:  Supple, no stridor. Swelling noted about the inferior aspect of left jaw extending down into the upper portion of the left neck CARDIO:  regular  rhythm and tachycardic, appears well-perfused  PULM:  No respiratory distress, CTAB MSK/SPINE:  No gross deformities, no edema, moves all extremities  SKIN:  no rash, atraumatic   *Additional and/or pertinent findings included in MDM below   ED Results / Procedures / Treatments   Labs (all labs ordered are listed, but only abnormal results are displayed) Labs Reviewed  BASIC METABOLIC PANEL - Abnormal; Notable for the following components:      Result Value   Sodium 134 (*)    Potassium 3.3 (*)    CO2 21 (*)    Glucose, Bld 342 (*)    Calcium 8.7 (*)    All other components within normal  limits  GROUP A STREP BY PCR  CBC    EKG None  Radiology CT Soft Tissue Neck W Contrast  Result Date: 01/09/2022 CLINICAL DATA:  Neck mass non pulsatile. Left-sided jaw pain and swelling. Fever EXAM: CT NECK WITH CONTRAST TECHNIQUE: Multidetector CT imaging of the neck was performed using the standard protocol following the bolus administration of intravenous contrast. RADIATION DOSE REDUCTION: This exam was performed according to the departmental dose-optimization program which includes automated exposure control, adjustment of the mA and/or kV according to patient size and/or use of iterative reconstruction technique. CONTRAST:  71m OMNIPAQUE IOHEXOL 300 MG/ML  SOLN COMPARISON:  None Available. FINDINGS: Pharynx and larynx: Effacement of the left piriform sinus due to mucosal and submucosal edema. This extends into the left hypopharynx. Edema likely due to inflammation in the left submandibular gland. Airway intact. Vocal cords normal Salivary glands: Asymmetric enlargement left submandibular gland which shows diffuse hyperenhancement and surrounding edema. No stone or mass. Negative for soft tissue abscess. Right submandibular gland normal. Parotid normal bilaterally. Thyroid: Negative Lymph nodes: No enlarged lymph nodes. Right level 2 lymph node 8 mm. Left level 2 lymph node 9 mm short axis dimension. Vascular: Normal vascular enhancement Limited intracranial: Negative Visualized orbits: Not significantly imaged. Mastoids and visualized paranasal sinuses: Mucosal edema left ethmoid sinus. Remaining sinuses clear. Mastoid clear bilaterally. Skeleton: No acute abnormality. Upper chest: Lung apices clear bilaterally Other: None IMPRESSION: 1. Asymmetric enlargement and hyperenhancement of the left submandibular gland with surrounding edema. Findings compatible with acute sialoadenitis. No stone or mass. 2. Effacement of the left piriform sinus due to mucosal and submucosal edema. This extends into the  left hypopharynx. Airway intact. 3. No enlarged lymph nodes in the neck. Electronically Signed   By: CFranchot GalloM.D.   On: 01/09/2022 12:53    Procedures Procedures    Medications Ordered in ED Medications  HYDROmorphone (DILAUDID) injection 1 mg (1 mg Intravenous Given 01/09/22 1218)  ondansetron (ZOFRAN) injection 4 mg (4 mg Intravenous Given 01/09/22 1215)  sodium chloride 0.9 % bolus 1,000 mL (0 mLs Intravenous Stopped 01/09/22 1336)  iohexol (OMNIPAQUE) 300 MG/ML solution 100 mL (75 mLs Intravenous Contrast Given 01/09/22 1236)  amoxicillin-clavulanate (AUGMENTIN) 875-125 MG per tablet 1 tablet (1 tablet Oral Given 01/09/22 1336)    ED Course/ Medical Decision Making/ A&P Clinical Course as of 01/09/22 1416  Wed [birth year], [id number] old female here for left neck swelling that is been going on for the last few days.  No trauma.  No dental pain.  She is a diabetic.  Getting labs and imaging.  Disposition per results of testing. [MB]    Clinical Course User Index [MB] BHayden Rasmussen MD  Medical Decision Making Amount and/or Complexity of Data Reviewed Labs: ordered. Radiology: ordered.  Risk Prescription drug management.   Initial Impression and Ddx 46 year old female who is ill-appearing but hemodynamically stable presenting for facial swelling.  Physical exam notable for submandibular swelling extending down into the neck.  Differential diagnosis for this complaint includes sialoadenitis, lymphadenopathy, retropharyngeal abscess, deep space abscess of the neck. Patient PMH that increases complexity of ED encounter:  DM  Interpretation of Diagnostics I independent reviewed and interpreted the labs as followed: Hyperglycemia, hypokalemia, and hyponatremia  - I independently visualized the following imaging with scope of interpretation limited to determining acute life threatening conditions related to emergency care: CT soft tissue of  the neck, which revealed   1. Asymmetric enlargement and hyperenhancement of the left  submandibular gland with surrounding edema. Findings compatible with  acute sialoadenitis. No stone or mass.  2. Effacement of the left piriform sinus due to mucosal and  submucosal edema. This extends into the left hypopharynx. Airway  intact.   Patient Reassessment and Ultimate Disposition/Management Treated pain with Dilaudid and nausea with Zofran.  Volume resuscitated with normal saline bolus.  CT scan concerning for acute sialoadenitis.  Treated with Augmentin.  Considered airway compromise given the significance of the swelling but reassured that no stridor on exam auscultation, lungs sound clear and patient is maintaining her O2 sats and has not tachypneic.  Sent remaining Augmentin course to her pharmacy.  Advised patient to eat sour candies.  Also advised to follow-up with ENT for reevaluation.  Patient management required discussion with the following services or consulting groups:  None  Complexity of Problems Addressed Acute complicated illness or Injury  Additional Data Reviewed and Analyzed Further history obtained from: None  Patient Encounter Risk Assessment Prescriptions         Final Clinical Impression(s) / ED Diagnoses Final diagnoses:  Acute sialoadenitis    Rx / DC Orders ED Discharge Orders          Ordered    amoxicillin-clavulanate (AUGMENTIN) 875-125 MG tablet  Every 12 hours        01/09/22 1415              Harriet Pho, PA-C 01/09/22 1416    Hayden Rasmussen, MD 01/09/22 Vernelle Emerald

## 2022-01-09 NOTE — ED Notes (Signed)
Pt given water. Able to swallow without difficulty.

## 2022-01-09 NOTE — ED Triage Notes (Signed)
Patient here POV from Home.  Endorses Lower Left Sided Jaw Pain and Swelling. Mass that is warm to touch noted. Subjective Fever. Some Sore Throat.  NAD Noted during Triage. A&Ox4. GCS 15. BIB Wheelchair.

## 2022-01-09 NOTE — Discharge Instructions (Addendum)
Evaluation for your facial swelling revealed that you have acute sialoadenitis which is an infection and inflammation of your salivary gland.  I have started you on Augmentin which is an antibiotic to treat infection.  Also recommend that you follow-up with ENT.  Contact information is in your discharge instructions.  Please schedule appointment within the next 2 to 3 days.  If you have trouble breathing, worsening drooling, extremely fatigued and cannot tolerate p.o. hydration please return to the emergency department for the evaluation.

## 2022-01-09 NOTE — ED Notes (Signed)
Pt verbalized understanding of d/c instructions, meds, and followup care. Denies questions. VSS, no distress noted. Steady gait to exit with all belongings. Translator used for [employer]

## 2022-01-31 ENCOUNTER — Other Ambulatory Visit: Payer: Self-pay

## 2022-02-04 ENCOUNTER — Ambulatory Visit: Payer: Self-pay | Admitting: Internal Medicine

## 2022-02-21 ENCOUNTER — Other Ambulatory Visit: Payer: Self-pay

## 2022-02-22 ENCOUNTER — Other Ambulatory Visit: Payer: Self-pay

## 2022-02-22 DIAGNOSIS — E7800 Pure hypercholesterolemia, unspecified: Secondary | ICD-10-CM

## 2022-02-22 DIAGNOSIS — Z1159 Encounter for screening for other viral diseases: Secondary | ICD-10-CM

## 2022-02-22 DIAGNOSIS — E119 Type 2 diabetes mellitus without complications: Secondary | ICD-10-CM

## 2022-02-22 DIAGNOSIS — Z79899 Other long term (current) drug therapy: Secondary | ICD-10-CM

## 2022-02-25 ENCOUNTER — Other Ambulatory Visit: Payer: Self-pay

## 2022-02-25 ENCOUNTER — Ambulatory Visit: Payer: Self-pay | Admitting: Internal Medicine

## 2022-02-25 ENCOUNTER — Encounter: Payer: Self-pay | Admitting: Internal Medicine

## 2022-02-25 ENCOUNTER — Telehealth: Payer: Self-pay | Admitting: Psychology

## 2022-02-25 VITALS — BP 142/96 | HR 72 | Resp 16 | Ht 60.25 in | Wt 255.0 lb

## 2022-02-25 DIAGNOSIS — K649 Unspecified hemorrhoids: Secondary | ICD-10-CM | POA: Insufficient documentation

## 2022-02-25 DIAGNOSIS — K644 Residual hemorrhoidal skin tags: Secondary | ICD-10-CM

## 2022-02-25 DIAGNOSIS — M549 Dorsalgia, unspecified: Secondary | ICD-10-CM

## 2022-02-25 DIAGNOSIS — Z6841 Body Mass Index (BMI) 40.0 and over, adult: Secondary | ICD-10-CM

## 2022-02-25 DIAGNOSIS — E7800 Pure hypercholesterolemia, unspecified: Secondary | ICD-10-CM

## 2022-02-25 DIAGNOSIS — E119 Type 2 diabetes mellitus without complications: Secondary | ICD-10-CM

## 2022-02-25 DIAGNOSIS — I10 Essential (primary) hypertension: Secondary | ICD-10-CM | POA: Insufficient documentation

## 2022-02-25 HISTORY — DX: Dorsalgia, unspecified: M54.9

## 2022-02-25 LAB — LIPID PANEL W/O CHOL/HDL RATIO
Cholesterol, Total: 167 mg/dL (ref 100–199)
HDL: 50 mg/dL (ref >39)
LDL Chol Calc (NIH): 94 mg/dL (ref 0–99)
Triglycerides: 129 mg/dL (ref 0–149)
VLDL Cholesterol Cal: 23 mg/dL (ref 5–40)

## 2022-02-25 LAB — COMPREHENSIVE METABOLIC PANEL
ALT: 57 IU/L — ABNORMAL HIGH (ref 0–32)
AST: 37 IU/L (ref 0–40)
Albumin/Globulin Ratio: 1.2 (ref 1.2–2.2)
Albumin: 3.4 g/dL — ABNORMAL LOW (ref 3.9–4.9)
Alkaline Phosphatase: 119 IU/L (ref 44–121)
BUN/Creatinine Ratio: 20 (ref 9–23)
BUN: 11 mg/dL (ref 6–24)
Bilirubin Total: 0.4 mg/dL (ref 0.0–1.2)
CO2: 25 mmol/L (ref 20–29)
Calcium: 8.9 mg/dL (ref 8.7–10.2)
Chloride: 101 mmol/L (ref 96–106)
Creatinine, Ser: 0.54 mg/dL — ABNORMAL LOW (ref 0.57–1.00)
Globulin, Total: 2.9 g/dL (ref 1.5–4.5)
Glucose: 237 mg/dL — ABNORMAL HIGH (ref 70–99)
Potassium: 3.9 mmol/L (ref 3.5–5.2)
Sodium: 138 mmol/L (ref 134–144)
Total Protein: 6.3 g/dL (ref 6.0–8.5)
eGFR: 115 mL/min/{1.73_m2} (ref >59)

## 2022-02-25 LAB — HEPATITIS C ANTIBODY: Hep C Virus Ab: NONREACTIVE

## 2022-02-25 LAB — HEMOGLOBIN A1C
Est. average glucose Bld gHb Est-mCnc: 372 mg/dL
Hgb A1c MFr Bld: 14.6 % — ABNORMAL HIGH (ref 4.8–5.6)

## 2022-02-25 MED ORDER — FLUCONAZOLE 150 MG PO TABS: ORAL_TABLET | ORAL | 0 refills | Status: DC

## 2022-02-25 MED ORDER — AGAMATRIX PRESTO TEST VI STRP: ORAL_STRIP | 11 refills | Status: DC

## 2022-02-25 MED ORDER — AGAMATRIX PRESTO W/DEVICE KIT: PACK | 0 refills | Status: DC

## 2022-02-25 MED ORDER — LISINOPRIL 10 MG PO TABS: 10.0000 mg | ORAL_TABLET | Freq: Every day | ORAL | 11 refills | Status: DC

## 2022-02-25 MED ORDER — METFORMIN HCL ER 500 MG PO TB24: ORAL_TABLET | ORAL | 11 refills | Status: DC

## 2022-02-25 MED ORDER — AGAMATRIX ULTRA-THIN LANCETS MISC: 11 refills | Status: DC

## 2022-02-25 MED ORDER — ATORVASTATIN CALCIUM 20 MG PO TABS: 20.0000 mg | ORAL_TABLET | Freq: Every day | ORAL | 11 refills | Status: DC

## 2022-02-25 MED ORDER — GLIPIZIDE 10 MG PO TABS: ORAL_TABLET | ORAL | 11 refills | Status: DC

## 2022-02-25 NOTE — Patient Instructions (Signed)
Tome un vaso de agua antes de cada comida Tome un minimo de 6 a 8 vasos de agua diarios Coma tres veces al dia Coma una proteina y Ardelia Mems grasa saludable con comida.  (huevos, pescado, pollo, pavo, y limite carnes rojas Coma 5 porciones diarias de legumbres.  Mezcle los colores Coma 2 porciones diarias de frutas con cascara cuando sea comestible Use platos pequeos Suelte su tenedor o cuchara despues de cada mordida hata que se mastique y se trague Come en la mesa con amigos o familiares por lo menos una vez al dia Apague la televisin y aparatos electrnicos durante la comida  Su objetivo debe ser perder una libra por semana  Estudios recientes indican que las personas quienes consumen todos de sus calorias durante 12 horas se bajan de pesocon Mas eficiencia.  Por ejemplo, si Usted come su primera comida a las 7:00 a.m., su comida final del dia se debe completar antes de las 7:00 p.m.  METAS!!!!!

## 2022-02-25 NOTE — Progress Notes (Signed)
Subjective:    Patient ID: Kayla Scott, female   DOB: 1975/03/08, 47 y.o.   MRN: [id number]   HPI  Charleston Ropes, adult daughter,  interprets   Hep C screening:  discussed negative.    2.  DM/obesity:  A1C not resulted for some reason--checking on that.  Blood glucose 237.  States she is checking her blood glucose and states 115  and 118 is the highest she has seen.  Only checking once weekly.   Her A1C has been 11.0 and higher since establishing.   She states she is taking the glipizide, but not clear that is correct. She has added more vegetables  First meal of day, up at 6/45 a.m.:  water. 7-7:30 a.m.:  drinks green smoothie:  green tomato, celery, cactus, broccoli, cucumber, lime.    11:00 a.m.:  chicken or beef, beans, sometimes refried.  May have white rice.  Puts veggies in rice.  Eats 3 tortillas with each meal.  Drinks a coke.    6 p.m.:  may or may not have a dinner. Similar to lunch.  Water.    Milk with cookies at night if skips dinner.    She is not really physically active.    3.  Left sided back pain:  PT has helped--pain is gone.  Went for 2 months.  4.  Elevated BP:  BP is high.  Never treated previously  5.  Hemorrhoids:  have improved with increased veggies/green juice.  Exacerbated by constipation recently.  After exam, she admits to a lot of burning and itching in vulvar/perineal/anal areas  Current Meds  Medication Sig   AgaMatrix Ultra-Thin Lancets MISC Check blood glucose twice daily before meals   albuterol (VENTOLIN HFA) 108 (90 Base) MCG/ACT inhaler Inhale 2 puffs into the lungs every 4 (four) hours as needed for wheezing or shortness of breath.   glipiZIDE (GLUCOTROL) 10 MG tablet 1 tab by mouth twice daily with meals   ibuprofen (ADVIL) 200 MG tablet Take 200 mg by mouth every 6 (six) hours as needed.   loratadine (CLARITIN) 10 MG tablet Take 1 tablet (10 mg total) by mouth daily.   metFORMIN (GLUCOPHAGE-XR) 500 MG 24 hr tablet  2 tabs by mouth twice daily with meals   No Known Allergies   Review of Systems    Objective:   BP (!) 142/96 (BP Location: Left Arm, Patient Position: Sitting, Cuff Size: Normal)   Pulse 72   Resp 16   Ht 5' 0.25" (1.53 m)   Wt 255 lb (115.7 kg)   LMP 05/02/2020   BMI 49.39 kg/m   Physical Exam NAD HEENT:  PERRL, EOMI Neck:  Supple, No adenopathy Chest:  CTA CV:  RRR with normal S1 andS2, No S3, S4 or murmur.  Carotid, radial and DP pulses normal and equal Abd:  obese, + BS, NT Rectal:  Perianal area moderately inflamed/irritated. Small external hemorrhoidal tags.     Assessment & Plan    DM:  Discussed need to know what meds she should be taking and check off she has received when at pharmacy.  Resent all her meds to [facility] at patient request.  If no A1C is resulted from previously drawn blood, will obtain at bp check in 1 week.  Urged her to make weekly goals for diet and physical activity and have entire family do the same.  Daughter states she can walk with her daily.  2.  Hypertension:  start Lisinopril 10 mg daily.  BMP and BP check in 1 week.  3.  Elevated LDL:  states she can make lifestyle changes and also missing her meds.  4.  Left sided back pain:  resolved.  Encouraged performing exercises daily to prevent re injury.  She is doing that.  5.  External hemorrhoids and likely yeast infection of area:  Fluconazole 150 mg daily for 3 days.  Discussed would likely return if does not get sugars under control.  Discussed fiber and fluids in diet to prevent constipation and problems with hermorrhoids.

## 2022-02-26 ENCOUNTER — Other Ambulatory Visit: Payer: Self-pay

## 2022-03-05 ENCOUNTER — Ambulatory Visit: Payer: Self-pay

## 2022-03-05 VITALS — BP 112/74 | HR 84

## 2022-03-05 DIAGNOSIS — Z79899 Other long term (current) drug therapy: Secondary | ICD-10-CM

## 2022-03-05 DIAGNOSIS — Z0130 Encounter for examination of blood pressure without abnormal findings: Secondary | ICD-10-CM

## 2022-03-05 MED ORDER — ATORVASTATIN CALCIUM 20 MG PO TABS: 10.0000 mg | ORAL_TABLET | Freq: Every day | ORAL | 5 refills | Status: DC

## 2022-03-05 MED ORDER — LISINOPRIL 10 MG PO TABS: 5.0000 mg | ORAL_TABLET | Freq: Every day | ORAL | 5 refills | Status: DC

## 2022-03-05 NOTE — Progress Notes (Signed)
Patient has reported that she has been taking half tab of lisinopril 52m for the past week. A full tab would make her nauseous and fatigued. Patient has also reported that she has been taking half tab of atorvastatin 20 mg since full tab would make her nauseous and fatigue   After discussing with Dr MAmil Amen patient will stay on half tab of lisinopril 110mdaily and half tab of atorvastatin 20 mg daily.

## 2022-03-06 LAB — BASIC METABOLIC PANEL
BUN/Creatinine Ratio: 28 — ABNORMAL HIGH (ref 9–23)
BUN: 15 mg/dL (ref 6–24)
CO2: 24 mmol/L (ref 20–29)
Calcium: 9.1 mg/dL (ref 8.7–10.2)
Chloride: 101 mmol/L (ref 96–106)
Creatinine, Ser: 0.54 mg/dL — ABNORMAL LOW (ref 0.57–1.00)
Glucose: 160 mg/dL — ABNORMAL HIGH (ref 70–99)
Potassium: 3.7 mmol/L (ref 3.5–5.2)
Sodium: 141 mmol/L (ref 134–144)
eGFR: 115 mL/min/{1.73_m2} (ref >59)

## 2022-04-08 ENCOUNTER — Telehealth: Payer: Self-pay

## 2022-04-08 NOTE — Telephone Encounter (Signed)
Patient would like an appointment for pain in her colon. Patient though it was hemorrhoids previously, but not believes it may be something else. Has had pain for about 2 months. She feels pain when she sits down. Has taken tylenol and ibuprofen, but pain continues to return. Patient denied blood in stool.

## 2022-04-09 NOTE — Telephone Encounter (Signed)
Patient has been scheduled

## 2022-04-10 ENCOUNTER — Ambulatory Visit: Payer: Self-pay | Admitting: Internal Medicine

## 2022-04-10 ENCOUNTER — Encounter: Payer: Self-pay | Admitting: Internal Medicine

## 2022-04-10 VITALS — BP 108/70 | HR 88 | Resp 16 | Ht 60.25 in | Wt 256.0 lb

## 2022-04-10 DIAGNOSIS — N644 Mastodynia: Secondary | ICD-10-CM

## 2022-04-10 DIAGNOSIS — K648 Other hemorrhoids: Secondary | ICD-10-CM

## 2022-04-10 DIAGNOSIS — E119 Type 2 diabetes mellitus without complications: Secondary | ICD-10-CM

## 2022-04-10 MED ORDER — GLIPIZIDE 10 MG PO TABS: ORAL_TABLET | ORAL | 11 refills | Status: DC

## 2022-04-10 MED ORDER — TRIAMCINOLONE ACETONIDE 0.1 % EX CREA: TOPICAL_CREAM | CUTANEOUS | 0 refills | Status: DC

## 2022-04-10 MED ORDER — EMPAGLIFLOZIN 10 MG PO TABS: 10.0000 mg | ORAL_TABLET | Freq: Every day | ORAL | 11 refills | Status: DC

## 2022-04-10 MED ORDER — MICONAZOLE NITRATE 2 % VA CREA: TOPICAL_CREAM | VAGINAL | 0 refills | Status: DC

## 2022-04-10 NOTE — Progress Notes (Signed)
Subjective:    Patient ID: Kayla Scott, female   DOB: 1975-10-16, 47 y.o.   MRN: [id number]   HPI  Tereasa Coop interprets   DM:  Decreased her Metformin to half her dose as she was having blood sugars down into 30s with shaking in the morning.  She thinks she did this March 3rd. States she was waking at 6:30 a.m. with these symptoms and low sugars.  Occurred daily for a 2 week period before she cut her medication back.  Eats dinner at 7 p.m.--quinoa with vegetables and sometimes oatmeal.   Her A1C was quite high at 14.6% on Feb 9th.    2.  Burning pain in rectum with pain.  Pain is inside, but burning is on outside.  No itching.  Throbbing pain at night inside.  Stools are narrow in caliber.  Sensation that she needs to have a BM, but unable to.  No bleeding--though did have with inflamed hemorrhoids then.    3.  Breast pain:  Right nipple painful.  No discharge.  Did not ever get set up for mammogram in August when ordered.      Current Meds  Medication Sig   AgaMatrix Ultra-Thin Lancets MISC Check blood glucose twice daily before meals   atorvastatin (LIPITOR) 20 MG tablet Take 0.5 tablets (10 mg total) by mouth daily.   Blood Glucose Monitoring Suppl (AGAMATRIX PRESTO) w/Device KIT Check blood glucose before meals twice daily   glipiZIDE (GLUCOTROL) 10 MG tablet 1 tab by mouth twice daily with meals   glucose blood (AGAMATRIX PRESTO TEST) test strip Check blood glucose before meals twice daily   ibuprofen (ADVIL) 200 MG tablet Take 200 mg by mouth every 6 (six) hours as needed.   lisinopril (ZESTRIL) 10 MG tablet Take 0.5 tablets (5 mg total) by mouth daily.   loratadine (CLARITIN) 10 MG tablet Take 1 tablet (10 mg total) by mouth daily.   metFORMIN (GLUCOPHAGE-XR) 500 MG 24 hr tablet 2 tabs by mouth twice daily with meals (Patient taking differently: 1 tabs by mouth twice daily with meals)   No Known Allergies   Review of Systems    Objective:    BP 108/70 (BP Location: Right Arm, Patient Position: Sitting, Cuff Size: Normal)   Pulse 88   Resp 16   Ht 5' 0.25" (1.53 m)   Wt 256 lb (116.1 kg)   LMP 05/02/2020   BMI 49.58 kg/m   Physical Exam NAD HEENT:  PERRL, EOMI, TMs pearly gray, throat without injection Neck:  Supple, No adenopathy Lungs;  CTA Chest:  No focal mass, skin dimpling or axillary adenopathy of either breast.  No nipple discharge.  No swelling, erythema of or tenderness of either nipple today. Abd:  S, NT, No HSM or mass, + BS Rectal/anus:  Mild external perianal inflammation.  On anoscopy, small internal hemorrhoids without bleeding.    Assessment & Plan   DM:  increase Metformin to 2 tabs twice daily.  Decrease Glipizide to 5 mg with evening dose only.  Add Jardiance 10 mg daily to morning meal.  Went over MAP application.  Discussed important to call if having issues with medications in future and not just stop or decrease a med. Encouraged her to have good fat and protein with each meal so sugars not dropping.  2.  Internal hemorrhoids and anal irritation.  Sitz baths, pat dry.  Apply triamcinolone and Miconazole creams twice daily for up to 5 days  with flare.  Encouraged Metamucil in 8 oz of fluid daily to maintain easy to pass, soft stools.    3.  Right nipple pain:  diagnostic mammogram.  Has follow up in June after fasting labs.

## 2022-04-17 ENCOUNTER — Telehealth: Payer: Self-pay

## 2022-04-17 NOTE — Telephone Encounter (Signed)
Contacted patient to inform her about her Optometry referral, asked her if she could pay $65 or if she wanted to wait. Patient stated that she no longer wants the referral because her and her family are being seen by [facility] optometry. I also mentioned to patient about her dental referral, patient stated that she has already been seen by a dental specialist and she no longer needs the referral.

## 2022-04-18 ENCOUNTER — Telehealth: Payer: Self-pay

## 2022-04-18 NOTE — Telephone Encounter (Signed)
Telephoned patient at mobile number using language line interpreter# [id number]. Left a voice message with [facility] contact information.

## 2022-04-23 ENCOUNTER — Other Ambulatory Visit: Payer: Self-pay

## 2022-04-23 DIAGNOSIS — N644 Mastodynia: Secondary | ICD-10-CM

## 2022-05-16 ENCOUNTER — Ambulatory Visit
Admission: RE | Admit: 2022-05-16 | Discharge: 2022-05-16 | Disposition: A | Discharge status: Home / Self Care | Payer: Self-pay | Source: Ambulatory Visit | Attending: Obstetrics and Gynecology | Admitting: Obstetrics and Gynecology

## 2022-05-16 ENCOUNTER — Ambulatory Visit
Admission: RE | Admit: 2022-05-16 | Discharge: 2022-05-16 | Disposition: A | Discharge status: Home / Self Care | Payer: No Typology Code available for payment source | Source: Ambulatory Visit | Attending: Obstetrics and Gynecology | Admitting: Obstetrics and Gynecology

## 2022-05-16 ENCOUNTER — Ambulatory Visit: Payer: Self-pay | Admitting: Hematology and Oncology

## 2022-05-16 VITALS — BP 110/84 | Wt 266.0 lb

## 2022-05-16 DIAGNOSIS — N644 Mastodynia: Secondary | ICD-10-CM

## 2022-05-16 DIAGNOSIS — Z1211 Encounter for screening for malignant neoplasm of colon: Secondary | ICD-10-CM

## 2022-05-16 NOTE — Progress Notes (Signed)
[name] is a 47 y.o. female who presents to [facility] clinic today with complaint of left breast pain.    Pap Smear: Pap not smear completed today. Last Pap smear was 09/10/21 and was normal. Per patient has no history of an abnormal Pap smear. Last Pap smear result is available in Epic. Will repeat in 2026 as HPV is unknown.   Physical exam: Breasts Breasts symmetrical. No skin abnormalities bilateral breasts. No nipple retraction bilateral breasts. No nipple discharge bilateral breasts. No lymphadenopathy. No lumps palpated bilateral breasts.        Pelvic/Bimanual Pap is not indicated today    Smoking History: Patient has never smoked and was not referred to quit line.    Patient Navigation: Patient education provided. Access to services provided for patient through [facility] program. No interpreter provided. No transportation provided   Colorectal Cancer Screening: Per patient has never had colonoscopy completed No complaints today. FIT test given.   Breast and Cervical Cancer Risk Assessment: Patient does not have family history of breast cancer, known genetic mutations, or radiation treatment to the chest before age 47. Patient does not have history of cervical dysplasia, immunocompromised, or DES exposure in-utero.  Risk Assessment   No risk assessment data     A: [facility] exam without pap smear Complaint of right breast pain with benign exam.   P: Referred patient to the [facility] for a diagnostic mammogram. Appointment scheduled 05/16/22.  Ilda Basset A, NP 05/16/2022 8:21 AM

## 2022-05-16 NOTE — Patient Instructions (Signed)
Taught Kayla Scott about self breast awareness and gave educational materials to take home. Patient did not need a Pap smear today due to last Pap smear was in 09/10/2021 per patient. Will repeat in August 2026. Let her know [facility] will cover Pap smears every 3 years unless has a history of abnormal Pap smears. Referred patient to the [facility] for diagnostic mammogram. Appointment scheduled for 05/16/22. Patient aware of appointment and will be there. Let patient know will follow up with her within the next couple weeks with results. [name] verbalized understanding.  Pascal Lux, NP 8:23 AM

## 2022-05-22 ENCOUNTER — Telehealth: Payer: Self-pay

## 2022-05-22 LAB — FECAL OCCULT BLOOD, IMMUNOCHEMICAL: Fecal Occult Bld: NEGATIVE

## 2022-05-22 NOTE — Telephone Encounter (Signed)
Opened in error

## 2022-05-24 ENCOUNTER — Telehealth: Payer: Self-pay | Admitting: Internal Medicine

## 2022-05-24 MED ORDER — DAPAGLIFLOZIN PROPANEDIOL 10 MG PO TABS: 10.0000 mg | ORAL_TABLET | Freq: Every day | ORAL | 11 refills | Status: DC

## 2022-05-24 NOTE — Telephone Encounter (Signed)
Notified patient of new Rx and that she would need to go and apply for MAP at the [facility].  Patient asked if there was a way to send the Medication to [facility] instead because she has to go pick up the medicine at [facility] in the mornings and at [facility] she can pick up in the afternoon and that way her son can take her.

## 2022-05-28 ENCOUNTER — Other Ambulatory Visit: Payer: Self-pay

## 2022-05-28 MED ORDER — DAPAGLIFLOZIN PROPANEDIOL 10 MG PO TABS: 10.0000 mg | ORAL_TABLET | Freq: Every day | ORAL | 11 refills | Status: DC

## 2022-06-04 ENCOUNTER — Other Ambulatory Visit: Payer: Self-pay

## 2022-06-06 ENCOUNTER — Other Ambulatory Visit: Payer: Self-pay

## 2022-06-06 DIAGNOSIS — Z79899 Other long term (current) drug therapy: Secondary | ICD-10-CM

## 2022-06-06 DIAGNOSIS — E7800 Pure hypercholesterolemia, unspecified: Secondary | ICD-10-CM

## 2022-06-06 DIAGNOSIS — E119 Type 2 diabetes mellitus without complications: Secondary | ICD-10-CM

## 2022-06-07 LAB — CBC WITH DIFFERENTIAL/PLATELET
Basophils Absolute: 0.1 10*3/uL (ref 0.0–0.2)
Basos: 1 %
EOS (ABSOLUTE): 0.1 10*3/uL (ref 0.0–0.4)
Eos: 1 %
Hematocrit: 40.8 % (ref 34.0–46.6)
Hemoglobin: 13.7 g/dL (ref 11.1–15.9)
Immature Grans (Abs): 0 10*3/uL (ref 0.0–0.1)
Immature Granulocytes: 0 %
Lymphocytes Absolute: 3.2 10*3/uL — ABNORMAL HIGH (ref 0.7–3.1)
Lymphs: 32 %
MCH: 33.1 pg — ABNORMAL HIGH (ref 26.6–33.0)
MCHC: 33.6 g/dL (ref 31.5–35.7)
MCV: 99 fL — ABNORMAL HIGH (ref 79–97)
Monocytes Absolute: 0.7 10*3/uL (ref 0.1–0.9)
Monocytes: 7 %
Neutrophils Absolute: 5.8 10*3/uL (ref 1.4–7.0)
Neutrophils: 59 %
Platelets: 279 10*3/uL (ref 150–450)
RBC: 4.14 x10E6/uL (ref 3.77–5.28)
RDW: 12.3 % (ref 11.7–15.4)
WBC: 9.8 10*3/uL (ref 3.4–10.8)

## 2022-06-07 LAB — COMPREHENSIVE METABOLIC PANEL
ALT: 31 IU/L (ref 0–32)
AST: 22 IU/L (ref 0–40)
Albumin/Globulin Ratio: 1.3 (ref 1.2–2.2)
Albumin: 3.7 g/dL — ABNORMAL LOW (ref 3.9–4.9)
Alkaline Phosphatase: 93 IU/L (ref 44–121)
BUN/Creatinine Ratio: 25 — ABNORMAL HIGH (ref 9–23)
BUN: 16 mg/dL (ref 6–24)
Bilirubin Total: 0.2 mg/dL (ref 0.0–1.2)
CO2: 24 mmol/L (ref 20–29)
Calcium: 9 mg/dL (ref 8.7–10.2)
Chloride: 104 mmol/L (ref 96–106)
Creatinine, Ser: 0.63 mg/dL (ref 0.57–1.00)
Globulin, Total: 2.8 g/dL (ref 1.5–4.5)
Glucose: 82 mg/dL (ref 70–99)
Potassium: 4.1 mmol/L (ref 3.5–5.2)
Sodium: 141 mmol/L (ref 134–144)
Total Protein: 6.5 g/dL (ref 6.0–8.5)
eGFR: 111 mL/min/{1.73_m2} (ref >59)

## 2022-06-07 LAB — HEMOGLOBIN A1C
Est. average glucose Bld gHb Est-mCnc: 209 mg/dL
Hgb A1c MFr Bld: 8.9 % — ABNORMAL HIGH (ref 4.8–5.6)

## 2022-06-07 LAB — LIPID PANEL W/O CHOL/HDL RATIO
Cholesterol, Total: 132 mg/dL (ref 100–199)
HDL: 60 mg/dL (ref >39)
LDL Chol Calc (NIH): 54 mg/dL (ref 0–99)
Triglycerides: 95 mg/dL (ref 0–149)
VLDL Cholesterol Cal: 18 mg/dL (ref 5–40)

## 2022-06-12 NOTE — Telephone Encounter (Signed)
No, it would be hundreds of dollars at [facility]

## 2022-06-27 ENCOUNTER — Ambulatory Visit: Payer: Self-pay | Admitting: Internal Medicine

## 2022-07-04 ENCOUNTER — Other Ambulatory Visit: Payer: Self-pay

## 2022-07-04 ENCOUNTER — Emergency Department (HOSPITAL_COMMUNITY): Payer: No Typology Code available for payment source

## 2022-07-04 ENCOUNTER — Emergency Department (HOSPITAL_COMMUNITY)
Admission: EM | Admit: 2022-07-04 | Discharge: 2022-07-04 | Disposition: A | Discharge status: Home / Self Care | Payer: No Typology Code available for payment source | Attending: Emergency Medicine | Admitting: Emergency Medicine

## 2022-07-04 ENCOUNTER — Encounter (HOSPITAL_COMMUNITY): Payer: Self-pay

## 2022-07-04 DIAGNOSIS — M62830 Muscle spasm of back: Secondary | ICD-10-CM | POA: Insufficient documentation

## 2022-07-04 DIAGNOSIS — Z79899 Other long term (current) drug therapy: Secondary | ICD-10-CM | POA: Insufficient documentation

## 2022-07-04 DIAGNOSIS — R200 Anesthesia of skin: Secondary | ICD-10-CM | POA: Insufficient documentation

## 2022-07-04 DIAGNOSIS — R1032 Left lower quadrant pain: Secondary | ICD-10-CM | POA: Insufficient documentation

## 2022-07-04 DIAGNOSIS — Z7901 Long term (current) use of anticoagulants: Secondary | ICD-10-CM | POA: Insufficient documentation

## 2022-07-04 DIAGNOSIS — R1031 Right lower quadrant pain: Secondary | ICD-10-CM | POA: Insufficient documentation

## 2022-07-04 DIAGNOSIS — M5416 Radiculopathy, lumbar region: Secondary | ICD-10-CM

## 2022-07-04 DIAGNOSIS — I10 Essential (primary) hypertension: Secondary | ICD-10-CM | POA: Insufficient documentation

## 2022-07-04 DIAGNOSIS — M5442 Lumbago with sciatica, left side: Secondary | ICD-10-CM

## 2022-07-04 DIAGNOSIS — E119 Type 2 diabetes mellitus without complications: Secondary | ICD-10-CM | POA: Insufficient documentation

## 2022-07-04 DIAGNOSIS — R7989 Other specified abnormal findings of blood chemistry: Secondary | ICD-10-CM | POA: Insufficient documentation

## 2022-07-04 DIAGNOSIS — Z7984 Long term (current) use of oral hypoglycemic drugs: Secondary | ICD-10-CM | POA: Insufficient documentation

## 2022-07-04 LAB — CBC WITH DIFFERENTIAL/PLATELET
Abs Immature Granulocytes: 0.03 10*3/uL (ref 0.00–0.07)
Basophils Absolute: 0 10*3/uL (ref 0.0–0.1)
Basophils Relative: 0 %
Eosinophils Absolute: 0.1 10*3/uL (ref 0.0–0.5)
Eosinophils Relative: 1 %
HCT: 40.5 % (ref 36.0–46.0)
Hemoglobin: 13.4 g/dL (ref 12.0–15.0)
Immature Granulocytes: 0 %
Lymphocytes Relative: 36 %
Lymphs Abs: 3.2 10*3/uL (ref 0.7–4.0)
MCH: 32.2 pg (ref 26.0–34.0)
MCHC: 33.1 g/dL (ref 30.0–36.0)
MCV: 97.4 fL (ref 80.0–100.0)
Monocytes Absolute: 0.7 10*3/uL (ref 0.1–1.0)
Monocytes Relative: 8 %
Neutro Abs: 4.9 10*3/uL (ref 1.7–7.7)
Neutrophils Relative %: 55 %
Platelets: 267 10*3/uL (ref 150–400)
RBC: 4.16 MIL/uL (ref 3.87–5.11)
RDW: 12.9 % (ref 11.5–15.5)
WBC: 9 10*3/uL (ref 4.0–10.5)
nRBC: 0 % (ref 0.0–0.2)

## 2022-07-04 LAB — URINALYSIS, W/ REFLEX TO CULTURE (INFECTION SUSPECTED)
Bilirubin Urine: NEGATIVE
Glucose, UA: >=500 mg/dL — AB
Hgb urine dipstick: NEGATIVE
Ketones, ur: NEGATIVE mg/dL
Leukocytes,Ua: NEGATIVE
Nitrite: NEGATIVE
Protein, ur: NEGATIVE mg/dL
Specific Gravity, Urine: 1.024 (ref 1.005–1.030)
pH: 5 (ref 5.0–8.0)

## 2022-07-04 LAB — COMPREHENSIVE METABOLIC PANEL
ALT: 29 U/L (ref 0–44)
AST: 22 U/L (ref 15–41)
Albumin: 2.9 g/dL — ABNORMAL LOW (ref 3.5–5.0)
Alkaline Phosphatase: 69 U/L (ref 38–126)
Anion gap: 9 (ref 5–15)
BUN: 22 mg/dL — ABNORMAL HIGH (ref 6–20)
CO2: 23 mmol/L (ref 22–32)
Calcium: 8.7 mg/dL — ABNORMAL LOW (ref 8.9–10.3)
Chloride: 105 mmol/L (ref 98–111)
Creatinine, Ser: 0.7 mg/dL (ref 0.44–1.00)
GFR, Estimated: >60 mL/min (ref >60)
Glucose, Bld: 82 mg/dL (ref 70–99)
Potassium: 3.5 mmol/L (ref 3.5–5.1)
Sodium: 137 mmol/L (ref 135–145)
Total Bilirubin: 0.3 mg/dL (ref 0.3–1.2)
Total Protein: 6.4 g/dL — ABNORMAL LOW (ref 6.5–8.1)

## 2022-07-04 LAB — I-STAT CHEM 8, ED
BUN: 24 mg/dL — ABNORMAL HIGH (ref 6–20)
Calcium, Ion: 1.17 mmol/L (ref 1.15–1.40)
Chloride: 107 mmol/L (ref 98–111)
Creatinine, Ser: 0.6 mg/dL (ref 0.44–1.00)
Glucose, Bld: 87 mg/dL (ref 70–99)
HCT: 41 % (ref 36.0–46.0)
Hemoglobin: 13.9 g/dL (ref 12.0–15.0)
Potassium: 3.6 mmol/L (ref 3.5–5.1)
Sodium: 141 mmol/L (ref 135–145)
TCO2: 24 mmol/L (ref 22–32)

## 2022-07-04 LAB — HCG, QUANTITATIVE, PREGNANCY: hCG, Beta Chain, Quant, S: 2 m[IU]/mL (ref <5)

## 2022-07-04 LAB — I-STAT BETA HCG BLOOD, ED (MC, WL, AP ONLY): I-stat hCG, quantitative: <5 m[IU]/mL (ref <5)

## 2022-07-04 LAB — LIPASE, BLOOD: Lipase: 73 U/L — ABNORMAL HIGH (ref 11–51)

## 2022-07-04 MED ORDER — HYDROMORPHONE HCL 1 MG/ML IJ SOLN
1.0000 mg | Freq: Once | INTRAMUSCULAR | Status: AC
  Administered 2022-07-04: 1 mg via INTRAVENOUS
  Filled 2022-07-04: qty 1

## 2022-07-04 MED ORDER — LIDOCAINE 5 % EX PTCH: 1.0000 | MEDICATED_PATCH | CUTANEOUS | 0 refills | Status: DC

## 2022-07-04 MED ORDER — MORPHINE SULFATE (PF) 4 MG/ML IV SOLN
4.0000 mg | Freq: Once | INTRAVENOUS | Status: AC
  Administered 2022-07-04: 4 mg via INTRAVENOUS
  Filled 2022-07-04: qty 1

## 2022-07-04 MED ORDER — ONDANSETRON HCL 4 MG/2ML IJ SOLN
4.0000 mg | Freq: Once | INTRAMUSCULAR | Status: AC
  Administered 2022-07-04: 4 mg via INTRAVENOUS
  Filled 2022-07-04: qty 2

## 2022-07-04 MED ORDER — METHOCARBAMOL 500 MG PO TABS: 500.0000 mg | ORAL_TABLET | Freq: Two times a day (BID) | ORAL | 0 refills | Status: DC

## 2022-07-04 MED ORDER — IOHEXOL 350 MG/ML SOLN
75.0000 mL | Freq: Once | INTRAVENOUS | Status: AC | PRN
  Administered 2022-07-04: 75 mL via INTRAVENOUS

## 2022-07-04 MED ORDER — LORAZEPAM 2 MG/ML IJ SOLN: 1.0000 mg | Freq: Once | INTRAMUSCULAR | Status: DC

## 2022-07-04 NOTE — ED Provider Notes (Signed)
Care assumed from [facility], PA-C at shift change. Please see their note for further information.   Briefly: Patient with left lower abdominal and flank pain onset yesterday morning. Pain radiates down her leg. No nausea, vomiting, diarrhea. No saddle paresthesias. No hx back pain.  Plan: labs and imaging pending at shift change and will determine dispo.   Labs have resulted and reveal no leukocytosis or anemia, BUN 22, calcium 8.7, protein 6.4, albumin 2.9. Lipase 73. UA noninfectious.  CT imaging has resulted and reveals  1. No acute CT findings of the abdomen or pelvis to explain left lower quadrant pain. 2. Hepatic steatosis. 3. No fracture or dislocation of the lumbar spine. Minimal lumbar endplate osteophytosis without significant disc space height loss.  I have personally reviewed and interpreted this imaging and agree with radiology interpretation.  Upon reassessment, patient states that her pain is not improved and that the numbness in her leg is persistent.  She states that she attempted to get up to use the bedside toilet and almost fell because her leg is numb.  She states that her leg is only numb on the lateral portion and the medial side has full sensation.  She states that her symptoms occurred all at once and have not been progressive.  Physical exam reveals DP and PT pulses intact and 2+.  5/5 strength in bilateral lower extremities.  Midline to left-sided TTP over the low lumbar spine.  No step-offs, lesions, deformity, or overlying skin changes.  Subjective numbness noted only to the lateral portion of the left leg.  Full ROM intact with full strength.  Patient is not an IV drug user, denies fevers or chills.  Has no leukocytosis.  No history of malignancy or chronic steroid use.  No saddle paresthesias or loss of bowel or bladder function.  However, given patient's concern for mobility issues plan to proceed with MRI imaging for further evaluation.  MRI has  resulted and reveals  1. Lumbar spondylosis, short pedicles, and degenerative disc disease causing moderate to prominent impingement at L4-5; moderate impingement at L1-2; and mild impingement at L3-4 and L5-S1, as detailed above. 2. Trace facet edema on the right at L4-5.  I have personally reviewed and interpreted this imaging and agree with radiology interpretation.  Discussed findings with patient, suspect that her impingement is causing her symptoms.  No emergent concerns related to this, no reason for admission.  Patient's pain is controlled.  Recommend RICE and anti-inflammatory medication with close PCP follow-up.  Will also give lidocaine patches and Robaxin.  Patient advised not to drive or operate heavy machinery while taking muscle relaxers. Evaluation and diagnostic testing in the emergency department does not suggest an emergent condition requiring admission or immediate intervention beyond what has been performed at this time.  Plan for discharge with close PCP follow-up.  Patient is understanding and amenable with plan, educated on red flag symptoms that would prompt immediate return.  Patient discharged in stable condition.  Findings and plan of care discussed with supervising physician Dr. Suezanne Jacquet who is in agreement.        Vear Clock 07/04/22 1409    Lonell Grandchild, MD 07/05/22 [phone]

## 2022-07-04 NOTE — ED Provider Notes (Signed)
[place] EMERGENCY DEPARTMENT AT [facility] Provider Note   CSN: [id number] Arrival date & time: 07/04/22  4098     History  Chief Complaint  Patient presents with   Flank Pain    Kayla Scott is a 47 y.o. female presents with concern for left lower abdominal pain and flank pain that started yesterday morning at 8 AM.  Also endorses pain that radiates down the left leg with some tingling in the leg.  Endorses pain is so severe she is having difficulty walking.  No nausea vomiting diarrhea fevers or chills.  No history of back pain.  I personally read medical records.  History of obesity, diabetes, hypercholesterolemia, hypertension.  Patient on any anticoagulation.  HPI     Home Medications Prior to Admission medications   Medication Sig Start Date End Date Taking? Authorizing Provider  AgaMatrix Ultra-Thin Lancets MISC Check blood glucose twice daily before meals 02/25/22   Julieanne Manson, MD  atorvastatin (LIPITOR) 20 MG tablet Take 0.5 tablets (10 mg total) by mouth daily. 03/05/22   Julieanne Manson, MD  Blood Glucose Monitoring Suppl (AGAMATRIX PRESTO) w/Device KIT Check blood glucose before meals twice daily 02/25/22   Julieanne Manson, MD  dapagliflozin propanediol (FARXIGA) 10 MG TABS tablet Take 1 tablet (10 mg total) by mouth daily before breakfast. 05/28/22   Julieanne Manson, MD  glipiZIDE (GLUCOTROL) 10 MG tablet 1 tab by mouth with morning meal and 1/2 tab by mouth with evening meal. 04/10/22   Julieanne Manson, MD  glucose blood (AGAMATRIX PRESTO TEST) test strip Check blood glucose before meals twice daily 02/25/22   Julieanne Manson, MD  ibuprofen (ADVIL) 200 MG tablet Take 200 mg by mouth every 6 (six) hours as needed.    [provider]  lisinopril (ZESTRIL) 10 MG tablet Take 0.5 tablets (5 mg total) by mouth daily. 03/05/22   Julieanne Manson, MD  loratadine (CLARITIN) 10 MG tablet Take 1 tablet (10 mg total) by  mouth daily. Patient not taking: Reported on 05/16/2022 03/07/21   Julieanne Manson, MD  metFORMIN (GLUCOPHAGE-XR) 500 MG 24 hr tablet 2 tabs by mouth twice daily with meals Patient taking differently: 1 tabs by mouth twice daily with meals 02/25/22   Julieanne Manson, MD  miconazole (MICONAZOLE 7) 2 % vaginal cream Apply twice daily to affected area for 7 days. 04/10/22   Julieanne Manson, MD  triamcinolone cream (KENALOG) 0.1 % Apply twice daily to affected area Patient not taking: Reported on 05/16/2022 04/10/22   Julieanne Manson, MD      Allergies    Patient has no known allergies.    Review of Systems   Review of Systems  Constitutional: Negative.   HENT: Negative.    Respiratory: Negative.    Cardiovascular: Negative.   Gastrointestinal:  Positive for abdominal pain. Negative for blood in stool, diarrhea, nausea and vomiting.  Genitourinary:  Positive for flank pain. Negative for decreased urine volume, dysuria, menstrual problem, vaginal bleeding, vaginal discharge and vaginal pain.  Musculoskeletal:  Positive for back pain. Negative for myalgias, neck pain and neck stiffness.  Skin: Negative.   Neurological:  Positive for numbness.       LLE tingling    Physical Exam Updated Vital Signs BP (!) 139/91 (BP Location: Right Wrist)   Pulse 70   Temp 99 F (37.2 C) (Oral)   Resp 20   Ht 5\' 4"  (1.626 m)   Wt 127 kg   LMP 05/02/2020   SpO2 100%  BMI 48.06 kg/m  Physical Exam Vitals and nursing note reviewed.  Constitutional:      Appearance: She is obese. She is not ill-appearing or toxic-appearing.  HENT:     Head: Normocephalic and atraumatic.     Mouth/Throat:     Mouth: Mucous membranes are moist.     Pharynx: No oropharyngeal exudate or posterior oropharyngeal erythema.  Eyes:     General:        Right eye: No discharge.        Left eye: No discharge.     Extraocular Movements: Extraocular movements intact.     Conjunctiva/sclera: Conjunctivae normal.      Pupils: Pupils are equal, round, and reactive to light.  Cardiovascular:     Rate and Rhythm: Normal rate and regular rhythm.     Pulses: Normal pulses.     Heart sounds: Normal heart sounds. No murmur heard. Pulmonary:     Effort: Pulmonary effort is normal. No respiratory distress.     Breath sounds: Normal breath sounds. No wheezing or rales.  Abdominal:     General: Bowel sounds are normal. There is no distension.     Palpations: Abdomen is soft.     Tenderness: There is abdominal tenderness in the right lower quadrant, suprapubic area and left lower quadrant. There is no right CVA tenderness, left CVA tenderness, guarding or rebound.  Musculoskeletal:        General: No deformity.     Cervical back: Normal and neck supple.     Thoracic back: Normal.     Lumbar back: Spasms and tenderness present. No bony tenderness. Positive left straight leg raise test. Negative right straight leg raise test.       Back:  Skin:    General: Skin is warm and dry.     Capillary Refill: Capillary refill takes less than 2 seconds.  Neurological:     Mental Status: She is alert. Mental status is at baseline.     Motor: Motor function is intact.     Comments: Reported decreased sensation in the LLE, symmetric strength, normal capillary refill bilaterally in the lower extremities.   Psychiatric:        Mood and Affect: Mood normal.     ED Results / Procedures / Treatments   Labs (all labs ordered are listed, but only abnormal results are displayed) Labs Reviewed  I-STAT CHEM 8, ED - Abnormal; Notable for the following components:      Result Value   BUN 24 (*)    All other components within normal limits  CBC WITH DIFFERENTIAL/PLATELET  COMPREHENSIVE METABOLIC PANEL  URINALYSIS, W/ REFLEX TO CULTURE (INFECTION SUSPECTED)  LIPASE, BLOOD  HCG, SERUM, QUALITATIVE  I-STAT BETA HCG BLOOD, ED ([facility], [facility], [facility] ONLY)    EKG EKG Interpretation  Date/Time:  Thursday July 04 2022 05:22:11  EDT Ventricular Rate:  71 PR Interval:  132 QRS Duration: 79 QT Interval:  402 QTC Calculation: 437 R Axis:   53 Text Interpretation: Sinus rhythm Low voltage, precordial leads Confirmed by Kennis Carina [phone]) on 07/04/2022 5:34:18 AM  Radiology No results found.  Procedures Procedures    Medications Ordered in ED Medications  morphine (PF) 4 MG/ML injection 4 mg (4 mg Intravenous Given 07/04/22 0641)  ondansetron (ZOFRAN) injection 4 mg (4 mg Intravenous Given 07/04/22 0640)    ED Course/ Medical Decision Making/ A&P  Medical Decision Making 47 y/o female who presents with concern for abdominal pain that radiates to the left flank area of the left leg.  Normal intake.  Cardiopulmonary exam is unremarkable, Abdominal exam is as above.  Patient with tingling subjective decreased sensation in the left lower extremity. Symmetric strength in the lower extremities.  No fevers or chills, medial signs.  The differential diagnosis of emergent flank pain includes, but is not limited to: Nephrolithiasis/ Renal Colic, Pyelonephritis, Abdominal aortic aneurysm, Aortic dissection, Renal artery embolism, Renal vein thrombosis, Renal infarction, Renal hemorrhage, Mesenteric ischemia, Bladder tumor, Cystitis, Biliary colic, Pancreatitis, Perforated peptic ulcer,  Appendicitis, Inguinal Hernia, Diverticulitis, Bowel obstruction. Shingles, Lower lobe pneumonia, Retroperitoneal hematoma/abscess/tumor, Epidural abscess, Epidural hematoma.  Particularly in females it is important to consider (Ectopic Pregnancy,PID/TOA,Ovarian cyst, Ovarian torsion, STD)  The emergent differential diagnosis for low back pain includes acute ligamentous and muscular injury, cord compression syndrome, pathologic fracture, transverse myelitis, vertebral osteomyelitis, discitis, and epidural abscess.   Amount and/or Complexity of Data Reviewed Labs: ordered.    Details: Istat  chem 8 with mild  elevation in BUN until 24, normal Cr - remainder of labs pending at time of shift change.  Radiology: ordered.  Risk Prescription drug management.   Care of this patient signed out to oncoming ED provider Lurena Nida, PA-C at time of shift change. All pertinent HPI, physical exam, and laboratory findings were discussed with them prior to my departure. Disposition of patient pending completion of workup, reevaluation, and clinical judgement of oncoming ED provider.   This chart was dictated using voice recognition software, Dragon. Despite the best efforts of this provider to proofread and correct errors, errors may still occur which can change documentation meaning.    Final Clinical Impression(s) / ED Diagnoses Final diagnoses:  None    Rx / DC Orders ED Discharge Orders     None         Sherrilee Gilles 07/04/22 1191    Sabas Sous, MD 07/04/22 [phone]

## 2022-07-04 NOTE — ED Triage Notes (Signed)
POV from home for left abd pain/flank pain since 0800 07/03/2022, taking mortin and tylenol for pain, woke up at 0330 to use restroom and took more, 0345 had family bring in

## 2022-07-04 NOTE — Discharge Instructions (Addendum)
As we discussed, MRI imaging of your lower back did show a pinched nerve which is likely causing your pain. There is been a lot of research on back pain, unfortunately the only thing that seems to really help is Tylenol and ibuprofen.  Relative rest is also important to not lift greater than 10 pounds bending or twisting at the waist.  Please follow-up with your family physician.  The other thing that really seems to benefit patients is physical therapy which your doctor may send you for.  Please return to the emergency department for new numbness or weakness to your arms or legs. Difficulty with urinating or urinating or pooping on yourself.  Also if you cannot feel toilet paper when you wipe or get a fever.   Take 4 over the counter ibuprofen tablets 3 times a day or 2 over-the-counter naproxen tablets twice a day for pain. Also take tylenol 1000mg (2 extra strength) four times a day.   Additionally, I have given you a prescription for Robaxin and lidocaine patches for you to use.  The Robaxin is a muscle relaxer can help you get comfortable particularly to sleep at night.  Please do not drive or operate heavy machinery while taking this medication as it can be sedating.  You may also wear the lidocaine patches on the areas that have pain.  Return if development of any new or worsening symptoms.

## 2022-07-04 NOTE — ED Notes (Signed)
Pt does not speak english.  Son at bedside and can translate.

## 2022-09-26 ENCOUNTER — Other Ambulatory Visit: Payer: Self-pay

## 2022-09-26 DIAGNOSIS — E119 Type 2 diabetes mellitus without complications: Secondary | ICD-10-CM

## 2022-09-27 LAB — HEMOGLOBIN A1C
Est. average glucose Bld gHb Est-mCnc: 194 mg/dL
Hgb A1c MFr Bld: 8.4 % — ABNORMAL HIGH (ref 4.8–5.6)

## 2022-10-10 ENCOUNTER — Encounter: Payer: Self-pay | Admitting: Nurse Practitioner

## 2022-10-10 ENCOUNTER — Other Ambulatory Visit (HOSPITAL_COMMUNITY)
Admission: RE | Admit: 2022-10-10 | Discharge: 2022-10-10 | Disposition: A | Discharge status: Home / Self Care | Payer: Self-pay | Source: Ambulatory Visit | Attending: Nurse Practitioner | Admitting: Nurse Practitioner

## 2022-10-10 ENCOUNTER — Ambulatory Visit: Payer: Self-pay | Admitting: Nurse Practitioner

## 2022-10-10 VITALS — BP 131/81 | HR 85 | Ht 64.0 in | Wt 276.0 lb

## 2022-10-10 DIAGNOSIS — M25512 Pain in left shoulder: Secondary | ICD-10-CM

## 2022-10-10 DIAGNOSIS — N761 Subacute and chronic vaginitis: Secondary | ICD-10-CM | POA: Insufficient documentation

## 2022-10-10 MED ORDER — MELOXICAM 15 MG PO TABS
15.0000 mg | ORAL_TABLET | Freq: Every day | ORAL | 0 refills | Status: DC
Start: 2022-10-10 — End: 2023-03-31

## 2022-10-10 MED ORDER — METHOCARBAMOL 750 MG PO TABS
750.0000 mg | ORAL_TABLET | Freq: Three times a day (TID) | ORAL | 1 refills | Status: DC | PRN
Start: 2022-10-10 — End: 2023-05-11

## 2022-10-10 NOTE — Progress Notes (Signed)
Assessment & Plan:  Kayla Scott was seen today for shoulder pain and vaginal itching.  Diagnoses and all orders for this visit:  Acute pain of left shoulder -     meloxicam (MOBIC) 15 MG tablet; Take 1 tablet (15 mg total) by mouth daily. Methocarbamol 750 mg TID prn  Chronic vaginitis Most recent wet prep negative. Although she does have poorly controlled diabetes I will refer her to GYN to evaluate for menopausal symptoms of vaginal atrophy -     Cervicovaginal ancillary only -     Ambulatory referral to Gynecology Lab Results  Component Value Date   HGBA1C 8.4 (H) 09/26/2022       Patient has been counseled on age-appropriate routine health concerns for screening and prevention. These are reviewed and up-to-date. Referrals have been placed accordingly. Immunizations are up-to-date or declined.    Subjective:   Chief Complaint  Patient presents with   Shoulder Pain    Left side, pain radiates from left chest to upper back. Patient denies any fall or known injury. Pain started Friday and has increased.    Vaginal Itching    Along with abdominal cramping, Pt states she has tried numerous medications and nothing is working. Irritation consistent for over a year.  Patient is a diabetic    Shoulder Pain  Pertinent negatives include no fever.  Vaginal Itching Associated symptoms include joint pain. Pertinent negatives include no fever, headaches, nausea or vomiting.   Kayla Scott 47 y.o. female presents to office today for left shoulder pain.  VRI was used to communicate directly with patient for the entire encounter including providing detailed patient instructions.     Joint/Muscle Pain: Patient complains of arthralgias for which has been present for several days (5). Pain is located in  left shoulder and upper left trapezius area , is described as aching, sharp, and tight band, and is constant .  Associated symptoms include: none.  The patient has  advil and  tylenol every 6 hours for pain with some relief .  Related to injury:   no.    Vaginitis: Patient complains of an abnormal vaginal discharge for several years. Vaginal symptoms include burning, discharge described as white, local irritation, and pain.Vulvar symptoms include none.STI Risk: Very low risk of STD exposure. Other associated symptoms: none.Menstrual pattern: She had been bleeding MENOPAUSAL. Contraception: none  Review of Systems  Constitutional:  Negative for fever, malaise/fatigue and weight loss.  HENT: Negative.  Negative for nosebleeds.   Eyes: Negative.  Negative for blurred vision, double vision and photophobia.  Respiratory: Negative.  Negative for cough and shortness of breath.   Cardiovascular: Negative.  Negative for chest pain, palpitations and leg swelling.  Gastrointestinal: Negative.  Negative for heartburn, nausea and vomiting.  Genitourinary:        SEE HPI  Musculoskeletal:  Positive for joint pain and myalgias.  Neurological: Negative.  Negative for dizziness, focal weakness, seizures and headaches.  Psychiatric/Behavioral: Negative.  Negative for suicidal ideas.     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 Bonnita Hollow office.  Reportedly recommended surgery, but could not afford.   S/P C-section 10/18/2014   UTI (lower urinary tract infection)     Past Surgical History:  Procedure Laterality Date  CESAREAN SECTION N/A 11/09/1998   [place]   CESAREAN SECTION N/A 07/24/2001   [facility]  Premature delivery due to placenta previa   CESAREAN SECTION WITH BILATERAL TUBAL LIGATION Bilateral 10/18/2014   Procedure: REPEAT CESAREAN SECTION WITH BILATERAL TUBAL LIGATION;  Surgeon: Adam Phenix, MD;  Location: [facility];  Service: Obstetrics;  Laterality:  Bilateral;    Family History  Problem Relation Age of Onset   Diabetes Mother    Colitis Mother    Diabetes Sister    Diabetes Brother    Cancer Paternal Grandmother        uterine    Social History Reviewed with no changes to be made today.   Outpatient Medications Prior to Visit  Medication Sig Dispense Refill   AgaMatrix Ultra-Thin Lancets MISC Check blood glucose twice daily before meals 100 each 11   atorvastatin (LIPITOR) 20 MG tablet Take 0.5 tablets (10 mg total) by mouth daily. 30 tablet 5   Blood Glucose Monitoring Suppl (AGAMATRIX PRESTO) w/Device KIT Check blood glucose before meals twice daily 1 kit 0   dapagliflozin propanediol (FARXIGA) 10 MG TABS tablet Take 1 tablet (10 mg total) by mouth daily before breakfast. 30 tablet 11   glipiZIDE (GLUCOTROL) 10 MG tablet 1 tab by mouth with morning meal and 1/2 tab by mouth with evening meal. 60 tablet 11   glucose blood (AGAMATRIX PRESTO TEST) test strip Check blood glucose before meals twice daily 50 each 11   ibuprofen (ADVIL) 200 MG tablet Take 200 mg by mouth every 6 (six) hours as needed.     lidocaine (LIDODERM) 5 % Place 1 patch onto the skin daily. Remove & Discard patch within 12 hours or as directed by MD 30 patch 0   lisinopril (ZESTRIL) 10 MG tablet Take 0.5 tablets (5 mg total) by mouth daily. 30 tablet 5   loratadine (CLARITIN) 10 MG tablet Take 1 tablet (10 mg total) by mouth daily. 30 tablet 11   metFORMIN (GLUCOPHAGE-XR) 500 MG 24 hr tablet 2 tabs by mouth twice daily with meals (Patient taking differently: 1 tabs by mouth twice daily with meals) 120 tablet 11   miconazole (MICONAZOLE 7) 2 % vaginal cream Apply twice daily to affected area for 7 days. 45 g 0   triamcinolone cream (KENALOG) 0.1 % Apply twice daily to affected area 30 g 0   methocarbamol (ROBAXIN) 500 MG tablet Take 1 tablet (500 mg total) by mouth 2 (two) times daily. 20 tablet 0   No facility-administered medications prior to visit.    No  Known Allergies     Objective:    BP 131/81 (BP Location: Left Arm, Patient Position: Sitting, Cuff Size: Large)   Pulse 85   Ht 5\' 4"  (1.626 m)   Wt 276 lb (125.2 kg)   LMP 05/02/2020   SpO2 93%   BMI 47.38 kg/m  Wt Readings from Last 3 Encounters:  10/10/22 276 lb (125.2 kg)  07/04/22 280 lb (127 kg)  05/16/22 266 lb (120.7 kg)    Physical Exam Vitals and nursing note reviewed.  Constitutional:      Appearance: She is well-developed.  HENT:     Head: Normocephalic and atraumatic.  Cardiovascular:     Rate and Rhythm: Normal rate and regular rhythm.     Heart sounds: Normal heart sounds. No murmur heard.    No friction rub. No gallop.  Pulmonary:     Effort: Pulmonary effort is normal. No tachypnea or respiratory distress.  Breath sounds: Normal breath sounds. No decreased breath sounds, wheezing, rhonchi or rales.  Chest:     Chest wall: No tenderness.  Abdominal:     General: Bowel sounds are normal.     Palpations: Abdomen is soft.  Genitourinary:    Comments: SELF SWAB Musculoskeletal:        General: Normal range of motion.     Cervical back: Normal range of motion.  Skin:    General: Skin is warm and dry.  Neurological:     Mental Status: She is alert and oriented to person, place, and time.     Coordination: Coordination normal.  Psychiatric:        Behavior: Behavior normal. Behavior is cooperative.        Thought Content: Thought content normal.        Judgment: Judgment normal.          Patient has been counseled extensively about nutrition and exercise as well as the importance of adherence with medications and regular follow-up. The patient was given clear instructions to go to ER or return to medical center if symptoms don't improve, worsen or new problems develop. The patient verbalized understanding.   Follow-up: Return if symptoms worsen or fail to improve.   Claiborne Rigg, FNP-BC [facility]  [place], [place] [phone]   10/10/2022, 12:10 PM

## 2022-10-11 LAB — CERVICOVAGINAL ANCILLARY ONLY
Bacterial Vaginitis (gardnerella): NEGATIVE
Candida Glabrata: POSITIVE — AB
Candida Vaginitis: POSITIVE — AB
Chlamydia: NEGATIVE
Comment: NEGATIVE
Comment: NEGATIVE
Comment: NEGATIVE
Comment: NEGATIVE
Comment: NEGATIVE
Comment: NORMAL
Neisseria Gonorrhea: NEGATIVE
Trichomonas: NEGATIVE

## 2022-10-19 ENCOUNTER — Other Ambulatory Visit: Payer: Self-pay | Admitting: Nurse Practitioner

## 2022-10-19 DIAGNOSIS — B3731 Acute candidiasis of vulva and vagina: Secondary | ICD-10-CM

## 2022-10-19 MED ORDER — FLUCONAZOLE 150 MG PO TABS
150.0000 mg | ORAL_TABLET | Freq: Once | ORAL | 0 refills | Status: AC
Start: 2022-10-19 — End: 2022-10-19

## 2022-10-31 ENCOUNTER — Telehealth: Payer: Self-pay

## 2022-10-31 NOTE — Telephone Encounter (Signed)
Pt and daughter Kayla Scott given lab results per notes of [name], NP on 10/31/22. Pt and [name]  verbalized understanding. Pt is still having vaginal itching and burning and asking if [name], NP can send in another dose of Diflucan. Advised I would send message back for review. No further assistance noted.   Claiborne Rigg, NP 10/19/2022  9:03 PM EDT     Vaginal swab positive for yeast. Will send medication to pharmacy

## 2022-11-01 ENCOUNTER — Other Ambulatory Visit: Payer: Self-pay | Admitting: Nurse Practitioner

## 2022-11-01 DIAGNOSIS — B3731 Acute candidiasis of vulva and vagina: Secondary | ICD-10-CM

## 2022-11-01 MED ORDER — FLUCONAZOLE 150 MG PO TABS
150.0000 mg | ORAL_TABLET | Freq: Once | ORAL | 0 refills | Status: AC
Start: 2022-11-01 — End: 2022-11-01

## 2022-11-01 NOTE — Telephone Encounter (Signed)
I will send diflucan. Please let her know she can not request any additional medications from [place] as she has not established care with [place]. She is still a patient of Dr. Delrae Alfred until February when she establishes care. I only saw her on the mobile unit for an acute visit in another city.

## 2022-11-01 NOTE — Telephone Encounter (Addendum)
Spoke with patient with assistance of interpreter [phone]. Patient advised that diflucan was sent to her pharmacy also aware that per provider she can not request any additional medications from [place] as she has not established care with [place]. Patient scheduled new patient appointment for 12/06/2022.

## 2022-11-05 ENCOUNTER — Ambulatory Visit: Payer: Self-pay | Admitting: Internal Medicine

## 2022-12-06 ENCOUNTER — Encounter: Payer: Self-pay | Admitting: Nurse Practitioner

## 2022-12-06 ENCOUNTER — Other Ambulatory Visit: Payer: Self-pay | Admitting: Pharmacist

## 2022-12-06 ENCOUNTER — Other Ambulatory Visit (HOSPITAL_COMMUNITY)
Admission: RE | Admit: 2022-12-06 | Discharge: 2022-12-06 | Disposition: A | Discharge status: Home / Self Care | Payer: Self-pay | Source: Ambulatory Visit | Attending: Nurse Practitioner | Admitting: Nurse Practitioner

## 2022-12-06 ENCOUNTER — Other Ambulatory Visit: Payer: Self-pay

## 2022-12-06 ENCOUNTER — Ambulatory Visit: Payer: Self-pay | Attending: Nurse Practitioner | Admitting: Nurse Practitioner

## 2022-12-06 VITALS — BP 122/85 | HR 100 | Ht 64.0 in | Wt 277.6 lb

## 2022-12-06 DIAGNOSIS — Z7985 Long-term (current) use of injectable non-insulin antidiabetic drugs: Secondary | ICD-10-CM

## 2022-12-06 DIAGNOSIS — B3731 Acute candidiasis of vulva and vagina: Secondary | ICD-10-CM | POA: Insufficient documentation

## 2022-12-06 DIAGNOSIS — Z7689 Persons encountering health services in other specified circumstances: Secondary | ICD-10-CM

## 2022-12-06 DIAGNOSIS — Z1211 Encounter for screening for malignant neoplasm of colon: Secondary | ICD-10-CM

## 2022-12-06 DIAGNOSIS — Z7984 Long term (current) use of oral hypoglycemic drugs: Secondary | ICD-10-CM

## 2022-12-06 DIAGNOSIS — E119 Type 2 diabetes mellitus without complications: Secondary | ICD-10-CM

## 2022-12-06 DIAGNOSIS — Z139 Encounter for screening, unspecified: Secondary | ICD-10-CM

## 2022-12-06 MED ORDER — FLUCONAZOLE 150 MG PO TABS
ORAL_TABLET | ORAL | 0 refills | Status: DC
  Filled 2022-12-06 (×2): qty 1, 1d supply, fill #0

## 2022-12-06 MED ORDER — TRUE METRIX BLOOD GLUCOSE TEST VI STRP
ORAL_STRIP | 12 refills | Status: DC
Start: 2022-12-06 — End: 2023-06-12
  Filled 2022-12-06: qty 100, 50d supply, fill #0
  Filled 2023-06-02: qty 100, 50d supply, fill #1

## 2022-12-06 MED ORDER — TRUEPLUS LANCETS 28G MISC
3 refills | Status: DC
Start: 2022-12-06 — End: 2023-06-12
  Filled 2022-12-06: qty 100, 50d supply, fill #0
  Filled 2023-06-02: qty 100, 50d supply, fill #1

## 2022-12-06 MED ORDER — TRUE METRIX METER W/DEVICE KIT
PACK | 0 refills | Status: DC
Start: 2022-12-06 — End: 2023-06-12
  Filled 2022-12-06: qty 1, 30d supply, fill #0

## 2022-12-06 MED ORDER — FLUCONAZOLE 150 MG PO TABS
ORAL_TABLET | ORAL | 0 refills | Status: DC
  Filled 2022-12-06: qty 15, 84d supply, fill #0

## 2022-12-06 MED ORDER — OZEMPIC (0.25 OR 0.5 MG/DOSE) 2 MG/3ML ~~LOC~~ SOPN
0.2500 mg | PEN_INJECTOR | SUBCUTANEOUS | 1 refills | Status: AC
Start: 2022-12-06 — End: ?
  Filled 2022-12-06: qty 3, 56d supply, fill #0
  Filled 2023-01-22 – 2023-01-23 (×2): qty 3, 56d supply, fill #1

## 2022-12-06 NOTE — Progress Notes (Signed)
Assessment & Plan:  Kayla Scott was seen today for medical management of chronic issues.  Diagnoses and all orders for this visit:  Diabetes mellitus without complication Added ozempic today -     Urine Albumin/Creatinine with ratio (send out) [LAB689] -     Semaglutide,0.25 or 0.5MG /DOS, (OZEMPIC, 0.25 OR 0.5 MG/DOSE,) 2 MG/3ML SOPN; Inject 0.25 mg into the skin once a week. -     CMP14+EGFR -     glucose blood (TRUE METRIX BLOOD GLUCOSE TEST) test strip; Use as instructed. Check blood glucose level by fingerstick twice per day.  E11.65 -     TRUEplus Lancets 28G MISC; Use as instructed. Check blood glucose level by fingerstick twice per day.  E11.65 -     Blood Glucose Monitoring Suppl (TRUE METRIX METER) w/Device KIT; Use as instructed. Check blood glucose level by fingerstick twice per day.  E11.65  Colon cancer screening -     Fecal occult blood, imunochemical([facility])  Yeast vaginitis -     Cervicovaginal ancillary only fluconazole (DIFLUCAN) 150 MG tablet; 1 tablet 150 mg, every 72 hours for 3 doses, then 150 mg once weekly for 3 months  Encounter for screening involving social determinants of health (SDoH) -     AMB Referral VBCI Care Management  Patient has been counseled on age-appropriate routine health concerns for screening and prevention. These are reviewed and up-to-date. Referrals have been placed accordingly. Immunizations are up-to-date or declined.    Subjective:   Chief Complaint  Patient presents with   Medical Management of Chronic Issues    Kayla Scott 47 y.o. female presents to office today to establish care  Patient has been advised to apply for financial assistance and schedule to see our financial counselor.    There is an onsite interpreter here with her today.    She was a patient of Dr. Delrae Alfred and would like to establish care here today as she has been experiencing an issue with her vagina. She states she has severe  vaginal itching and pain with sitting. Notes frequent vaginal yeast infections. A1c is not at goal. She is also taking farxiga which could likely be contributing to her re occurring yeast infections.  She notes exercise is difficult for her due to her weight, numbness in her left foot, back pain and right knee pain  Diabetes is poorly controlled.  Lab Results  Component Value Date   HGBA1C 8.4 (H) 09/26/2022  Blood pressure is well controlled.   BP Readings from Last 3 Encounters:  12/06/22 122/85  10/10/22 131/81  07/04/22 110/70   LDL at goal.  Lab Results  Component Value Date   LDLCALC 54 06/06/2022     Review of Systems  Constitutional:  Negative for fever, malaise/fatigue and weight loss.  HENT: Negative.  Negative for nosebleeds.   Eyes: Negative.  Negative for blurred vision, double vision and photophobia.  Respiratory: Negative.  Negative for cough and shortness of breath.   Cardiovascular: Negative.  Negative for chest pain, palpitations and leg swelling.  Gastrointestinal: Negative.  Negative for heartburn, nausea and vomiting.  Genitourinary:        Yeast vaginitis  Musculoskeletal: Negative.  Negative for myalgias.  Neurological: Negative.  Negative for dizziness, focal weakness, seizures and headaches.  Psychiatric/Behavioral: Negative.  Negative for suicidal ideas.     Past Medical History:  Diagnosis Date   Diabetes mellitus without complication (HCC) 01/14/2014   Elevated LDL cholesterol level    Gestational diabetes  Left-sided back pain 02/25/2022   Obesity    Right knee DJD 03/14/2020   Had injected with Dr. Bonnita Hollow office.  Reportedly recommended surgery, but could not afford.   S/P C-section 10/18/2014   UTI (lower urinary tract infection)     Past Surgical History:  Procedure Laterality Date   CESAREAN SECTION N/A 11/09/1998   [place]   CESAREAN SECTION N/A 07/24/2001   [facility]  Premature delivery due to placenta previa   CESAREAN SECTION  WITH BILATERAL TUBAL LIGATION Bilateral 10/18/2014   Procedure: REPEAT CESAREAN SECTION WITH BILATERAL TUBAL LIGATION;  Surgeon: Adam Phenix, MD;  Location: [facility];  Service: Obstetrics;  Laterality: Bilateral;    Family History  Problem Relation Age of Onset   Diabetes Mother    Colitis Mother    Diabetes Sister    Diabetes Brother    Cancer Paternal Grandmother        uterine    Social History Reviewed with no changes to be made today.   Outpatient Medications Prior to Visit  Medication Sig Dispense Refill   AgaMatrix Ultra-Thin Lancets MISC Check blood glucose twice daily before meals 100 each 11   atorvastatin (LIPITOR) 20 MG tablet Take 0.5 tablets (10 mg total) by mouth daily. 30 tablet 5   Blood Glucose Monitoring Suppl (AGAMATRIX PRESTO) w/Device KIT Check blood glucose before meals twice daily 1 kit 0   glipiZIDE (GLUCOTROL) 10 MG tablet 1 tab by mouth with morning meal and 1/2 tab by mouth with evening meal. 60 tablet 11   glucose blood (AGAMATRIX PRESTO TEST) test strip Check blood glucose before meals twice daily 50 each 11   ibuprofen (ADVIL) 200 MG tablet Take 200 mg by mouth every 6 (six) hours as needed.     lidocaine (LIDODERM) 5 % Place 1 patch onto the skin daily. Remove & Discard patch within 12 hours or as directed by MD 30 patch 0   lisinopril (ZESTRIL) 10 MG tablet Take 0.5 tablets (5 mg total) by mouth daily. 30 tablet 5   loratadine (CLARITIN) 10 MG tablet Take 1 tablet (10 mg total) by mouth daily. 30 tablet 11   meloxicam (MOBIC) 15 MG tablet Take 1 tablet (15 mg total) by mouth daily. 30 tablet 0   metFORMIN (GLUCOPHAGE-XR) 500 MG 24 hr tablet 2 tabs by mouth twice daily with meals (Patient taking differently: 1 tabs by mouth twice daily with meals) 120 tablet 11   methocarbamol (ROBAXIN) 750 MG tablet Take 1 tablet (750 mg total) by mouth every 8 (eight) hours as needed for muscle spasms. 60 tablet 1   miconazole (MICONAZOLE 7) 2 % vaginal cream Apply  twice daily to affected area for 7 days. 45 g 0   triamcinolone cream (KENALOG) 0.1 % Apply twice daily to affected area 30 g 0   dapagliflozin propanediol (FARXIGA) 10 MG TABS tablet Take 1 tablet (10 mg total) by mouth daily before breakfast. 30 tablet 11   No facility-administered medications prior to visit.    No Known Allergies     Objective:    BP 122/85 (BP Location: Left Arm, Patient Position: Sitting, Cuff Size: Normal)   Pulse 100   Ht 5\' 4"  (1.626 m)   Wt 277 lb 9.6 oz (125.9 kg)   LMP 05/02/2020   SpO2 97%   BMI 47.65 kg/m  Wt Readings from Last 3 Encounters:  12/06/22 277 lb 9.6 oz (125.9 kg)  10/10/22 276 lb (125.2 kg)  07/04/22 280 lb (127  kg)    Physical Exam Vitals and nursing note reviewed.  Constitutional:      Appearance: She is well-developed.  HENT:     Head: Normocephalic and atraumatic.  Cardiovascular:     Rate and Rhythm: Regular rhythm. Tachycardia present.     Heart sounds: Normal heart sounds. No murmur heard.    No friction rub. No gallop.  Pulmonary:     Effort: Pulmonary effort is normal. No tachypnea or respiratory distress.     Breath sounds: Normal breath sounds. No decreased breath sounds, wheezing, rhonchi or rales.  Chest:     Chest wall: No tenderness.  Abdominal:     General: Bowel sounds are normal.     Palpations: Abdomen is soft.  Musculoskeletal:        General: Normal range of motion.     Cervical back: Normal range of motion.  Skin:    General: Skin is warm and dry.  Neurological:     Mental Status: She is alert and oriented to person, place, and time.     Coordination: Coordination normal.  Psychiatric:        Behavior: Behavior normal. Behavior is cooperative.        Thought Content: Thought content normal.        Judgment: Judgment normal.          Patient has been counseled extensively about nutrition and exercise as well as the importance of adherence with medications and regular follow-up. The patient  was given clear instructions to go to ER or return to medical center if symptoms don't improve, worsen or new problems develop. The patient verbalized understanding.   Follow-up: Return in about 6 weeks (around 01/17/2023) for meter check with luke.   Claiborne Rigg, FNP-BC [facility] [place], [place] [phone]   12/30/2022, 9:31 PM

## 2022-12-09 ENCOUNTER — Other Ambulatory Visit: Payer: Self-pay

## 2022-12-09 ENCOUNTER — Telehealth: Payer: Self-pay | Admitting: *Deleted

## 2022-12-09 ENCOUNTER — Other Ambulatory Visit: Payer: Self-pay | Admitting: Nurse Practitioner

## 2022-12-09 LAB — MICROALBUMIN / CREATININE URINE RATIO
Creatinine, Urine: 65.8 mg/dL
Microalb/Creat Ratio: 18 mg/g{creat} (ref 0–29)
Microalbumin, Urine: 11.8 ug/mL

## 2022-12-09 LAB — CERVICOVAGINAL ANCILLARY ONLY
Bacterial Vaginitis (gardnerella): NEGATIVE
Candida Glabrata: POSITIVE — AB
Candida Vaginitis: POSITIVE — AB
Chlamydia: NEGATIVE
Comment: NEGATIVE
Comment: NEGATIVE
Comment: NEGATIVE
Comment: NEGATIVE
Comment: NEGATIVE
Comment: NORMAL
Neisseria Gonorrhea: NEGATIVE
Trichomonas: NEGATIVE

## 2022-12-09 LAB — CMP14+EGFR
ALT: 49 IU/L — ABNORMAL HIGH (ref 0–32)
AST: 35 [IU]/L (ref 0–40)
Albumin: 4 g/dL (ref 3.9–4.9)
Alkaline Phosphatase: 131 [IU]/L — ABNORMAL HIGH (ref 44–121)
BUN/Creatinine Ratio: 22 (ref 9–23)
BUN: 20 mg/dL (ref 6–24)
Bilirubin Total: 0.3 mg/dL (ref 0.0–1.2)
CO2: 21 mmol/L (ref 20–29)
Calcium: 9.6 mg/dL (ref 8.7–10.2)
Chloride: 100 mmol/L (ref 96–106)
Creatinine, Ser: 0.93 mg/dL (ref 0.57–1.00)
Globulin, Total: 3.1 g/dL (ref 1.5–4.5)
Glucose: 258 mg/dL — ABNORMAL HIGH (ref 70–99)
Potassium: 4.2 mmol/L (ref 3.5–5.2)
Sodium: 142 mmol/L (ref 134–144)
Total Protein: 7.1 g/dL (ref 6.0–8.5)
eGFR: 77 mL/min/{1.73_m2} (ref >59)

## 2022-12-09 NOTE — Progress Notes (Addendum)
  Care Coordination   Note   12/09/2022 Name: Janeia Spanbauer MRN: [id number] DOB: 1975/02/01  Mahalie Ermalinda Memos is a 47 y.o. year old female who sees Claiborne Rigg, NP for primary care. I reached out to [name] by phone today to offer care coordination services. Using [employer] [id number] named Raeanne Gathers.  Ms. Ermalinda Memos was given information about Care Coordination services today including:   The Care Coordination services include support from the care team which includes your Nurse Coordinator, Clinical Social Worker, or Pharmacist.  The Care Coordination team is here to help remove barriers to the health concerns and goals most important to you. Care Coordination services are voluntary, and the patient may decline or stop services at any time by request to their care team member.   Care Coordination Consent Status: Patient agreed to services and verbal consent obtained.   Follow up plan:  Telephone appointment with care coordination team member scheduled for:  12/19/22  Encounter Outcome:  Patient Scheduled  [facility] Coordination Care Guide  Direct Dial: [phone]

## 2022-12-11 ENCOUNTER — Other Ambulatory Visit: Payer: Self-pay

## 2022-12-11 LAB — FECAL OCCULT BLOOD, IMMUNOCHEMICAL: Fecal Occult Bld: NEGATIVE

## 2022-12-19 ENCOUNTER — Ambulatory Visit: Payer: Self-pay | Admitting: Licensed Clinical Social Worker

## 2022-12-19 NOTE — Patient Instructions (Signed)
Visit Information  Thank you for taking time to visit with me today. Please don't hesitate to contact me if I can be of assistance to you.   Following are the goals we discussed today:   Goals Addressed             This Visit's Progress    COMPLETED: Care Coordination Activities       Care Coordination Interventions: Patient stated that she applied for the orange card again and was denied and was denied for Medicaid. She stated that she had the orange card in 2021 and was also paying $40 for her PCP visits, but he PCP no longer takes the orange card and she does not want to change clinics, she likes the clinic that she goes too.  Patient stated that she will stay with her clinic and will continue to pay. Patient stated that the SW does not need to follow back up with her.          No follow Up needed  Please call the care guide team at [phone] if you need to cancel or reschedule your appointment.   If you are experiencing a Mental Health or Behavioral Health Crisis or need someone to talk to, please call the Suicide and Crisis Lifeline: 988 go to [facility] Urgent [facility] [address], [place] [phone]) call 911  Patient verbalizes understanding of instructions and care plan provided today and agrees to view in MyChart. Active MyChart status and patient understanding of how to access instructions and care plan via MyChart confirmed with patient.

## 2022-12-19 NOTE — Patient Outreach (Addendum)
  Care Coordination   Initial Visit Note   12/19/2022 Name: Giavanna Kerby MRN: [id number] DOB: 24-Jan-1975  Roxsana Ermalinda Memos is a 47 y.o. year old female who sees Claiborne Rigg, NP for primary care. I spoke with  [name] by phone today.  What matters to the patients health and wellness today?  Insurance    Goals Addressed             This Visit's Progress    COMPLETED: Care Coordination Activities       Care Coordination Interventions: Patient stated that she applied for the orange card again and was denied and was denied for Medicaid. She stated that she had the orange card in 2021 and was also paying $40 for her PCP visits, but he PCP no longer takes the orange card and she does not want to change clinics, she likes the clinic that she goes too.  Patient stated that she will stay with her clinic and will continue to pay. Patient stated that the SW does not need to follow back up with her.          SDOH assessments and interventions completed:  Yes  SDOH Interventions Today    Flowsheet Row Most Recent Value  SDOH Interventions   Food Insecurity Interventions Intervention Not Indicated  Housing Interventions Intervention Not Indicated  Transportation Interventions Intervention Not Indicated  Utilities Interventions Intervention Not Indicated  Health Literacy Interventions Intervention Not Indicated        Care Coordination Interventions:  Yes, provided  Interventions Today    Flowsheet Row Most Recent Value  General Interventions   General Interventions Discussed/Reviewed --  [Patient was denied for [facility] card and denied for Medicaid]      Interventions Today    Flowsheet Row Most Recent Value  General Interventions   General Interventions Discussed/Reviewed --  [Patient was denied for [facility] card and denied for Medicaid]         Follow up plan: No further intervention required.   Encounter Outcome:  Patient  Visit Completed  Jeanie Cooks, PhD [facility], [facility] Social Worker Direct Dial: [phone]  Fax: [phone]

## 2022-12-30 ENCOUNTER — Encounter: Payer: Self-pay | Admitting: Nurse Practitioner

## 2023-01-13 ENCOUNTER — Other Ambulatory Visit: Payer: Self-pay

## 2023-01-21 ENCOUNTER — Other Ambulatory Visit: Payer: Self-pay

## 2023-01-21 ENCOUNTER — Ambulatory Visit (INDEPENDENT_AMBULATORY_CARE_PROVIDER_SITE_OTHER): Payer: Self-pay | Admitting: Obstetrics and Gynecology

## 2023-01-21 ENCOUNTER — Encounter: Payer: Self-pay | Admitting: Obstetrics and Gynecology

## 2023-01-21 VITALS — BP 109/74 | HR 94 | Wt 274.0 lb

## 2023-01-21 DIAGNOSIS — Z758 Other problems related to medical facilities and other health care: Secondary | ICD-10-CM

## 2023-01-21 DIAGNOSIS — Z603 Acculturation difficulty: Secondary | ICD-10-CM

## 2023-01-21 DIAGNOSIS — N760 Acute vaginitis: Secondary | ICD-10-CM

## 2023-01-21 MED ORDER — BORIC ACID CRYS: 600.0000 mg | CRYSTALS | Freq: Every day | Status: AC

## 2023-01-21 NOTE — Progress Notes (Signed)
 Obstetrics and Gynecology New Patient Evaluation  Appointment Date: 01/22/2023  OBGYN Clinic: [facility]  Primary Care Provider: [name] Haze ORN  Referring Provider: Adella Norris, MD  Chief Complaint: Recurrent vaginitis  History of Present Illness: Kayla Scott is a 48 y.o. Hispanic [id number] (Patient's last menstrual period was 05/02/2020.), seen for the above chief complaint. Her past medical history is significant for DM2, BMI 40s  Patient states that s/s of recurrent vaginal itching and burning around the genital hiatus area began about a year and half ago. PCP swabs on 9/26 and 11/22 shows +candida vaginitis and candida glabrata. PCP most recently put her on an extended course of diflucan  on 11/22 with q3d doses for 3 doses and then weekly for 3 months. 9/12 A1c 8.4  Patient states itching is gone/much better but still having burning at the [facility] area.   LMP 3 years ago and no h/o spotting or periods, per patient.   Review of Systems: Pertinent items noted in HPI and remainder of comprehensive ROS otherwise negative.   Patient Active Problem List   Diagnosis Date Noted   Primary hypertension 02/25/2022   Hemorrhoids 02/25/2022   Elevated LDL cholesterol level 09/10/2021   Osteoarthritis of right knee 03/07/2021   Diabetes mellitus without complication (HCC) 12/29/2020   Recurrent vaginitis 12/29/2020   Dermatitis 03/28/2017   Left lower quadrant pain 03/28/2017   Class 3 severe obesity with serious comorbidity and body mass index (BMI) of 45.0 to 49.9 in adult [facility]) 03/28/2017    Past Medical History:  Past Medical History:  Diagnosis Date   Diabetes mellitus without complication (HCC) 01/14/2014   Elevated LDL cholesterol level    Gestational diabetes    Left-sided back pain 02/25/2022   Obesity    Right knee DJD 03/14/2020   Had injected with Dr. Marinell office.  Reportedly recommended surgery, but could not  afford.   S/P C-section 10/18/2014   UTI (lower urinary tract infection)     Past Surgical History:  Past Surgical History:  Procedure Laterality Date   CESAREAN SECTION N/A 11/09/1998   [place]   CESAREAN SECTION N/A 07/24/2001   [facility]  Premature delivery due to placenta previa   CESAREAN SECTION WITH BILATERAL TUBAL LIGATION Bilateral 10/18/2014   Procedure: REPEAT CESAREAN SECTION WITH BILATERAL TUBAL LIGATION;  Surgeon: Lynwood KANDICE Solomons, MD;  Location: [facility];  Service: Obstetrics;  Laterality: Bilateral;    Past Obstetrical History:  OB History  Gravida Para Term Preterm AB Living  8 5 4 1 3 5   SAB IAB Ectopic Multiple Live Births  3   0 5    # Outcome Date GA Lbr Len/2nd Weight Sex Type Anes PTL Lv  8 Term 10/18/14 [redacted]w[redacted]d  7 lb (3.175 kg) F CS-LTranv EPI  LIV  7 SAB 01/14/13 [redacted]w[redacted]d         6 SAB 01/15/12 [redacted]w[redacted]d         5 SAB 01/14/09 [redacted]w[redacted]d            Birth Comments: System Generated. Please review and update pregnancy details.  4 Preterm 07/24/01 [redacted]w[redacted]d   M CS-LTranv Gen Y LIV  3 Term 11/09/98 [redacted]w[redacted]d   F CS-LTranv Spinal N LIV  2 Term 10/05/94 [redacted]w[redacted]d   M Vag-Spont  N LIV  1 Term 01/12/93 [redacted]w[redacted]d   M Vag-Spont  N LIV    Past Gynecological History: As per HPI. History of Pap Smear(s): Yes.   Last pap 08/2021, which  was pap and hpv neg  Social History:  Social History   Socioeconomic History   Marital status: Married    Spouse name: Armed Forces Technical Officer   Number of children: 5   Years of education: 12   Highest education level: High school graduate  Occupational History   Occupation: Housewife    Comment: Previously cleaned at [employer]  Tobacco Use   Smoking status: Never    Passive exposure: Never   Smokeless tobacco: Never  Vaping Use   Vaping status: Never Used  Substance and Sexual Activity   Alcohol use: No   Drug use: No   Sexual activity: Yes    Birth control/protection: Surgical    Comment: Tubal ligation  Other Topics Concern   Not on file   Social History Narrative   Lives at home with husband (brother to another patient) and 2 youngest children   Originally from [place].    Came to [place] in [place]   Social Drivers of Health   Financial Resource Strain: Low Risk  (09/10/2021)   Overall Financial Resource Strain (CARDIA)    Difficulty of Paying Living Expenses: Not hard at all  Food Insecurity: No Food Insecurity (01/21/2023)   Hunger Vital Sign    Worried About Running Out of Food in the Last Year: Never true    Ran Out of Food in the Last Year: Never true  Transportation Needs: No Transportation Needs (01/21/2023)   PRAPARE - Administrator, Civil Service (Medical): No    Lack of Transportation (Non-Medical): No  Physical Activity: Sufficiently Active (12/06/2022)   Exercise Vital Sign    Days of Exercise per Week: 4 days    Minutes of Exercise per Session: 60 min  Stress: No Stress Concern Present (12/06/2022)   [employer] of Occupational Health - Occupational Stress Questionnaire    Feeling of Stress : Not at all  Social Connections: Moderately Isolated (12/06/2022)   Social Connection and Isolation Panel [NHANES]    Frequency of Communication with Friends and Family: More than three times a week    Frequency of Social Gatherings with Friends and Family: Twice a week    Attends Religious Services: Never    Database Administrator or Organizations: No    Attends Banker Meetings: Never    Marital Status: Married  Catering Manager Violence: Not At Risk (12/19/2022)   Humiliation, Afraid, Rape, and Kick questionnaire    Fear of Current or Ex-Partner: No    Emotionally Abused: No    Physically Abused: No    Sexually Abused: No    Family History:  Family History  Problem Relation Age of Onset   Diabetes Mother    Colitis Mother    Diabetes Sister    Diabetes Brother    Cancer Paternal Grandmother        uterine    Medications Kayla Scott had no medications  administered during this visit. Current Outpatient Medications  Medication Sig Dispense Refill   AgaMatrix Ultra-Thin Lancets MISC Check blood glucose twice daily before meals 100 each 11   atorvastatin  (LIPITOR) 20 MG tablet Take 0.5 tablets (10 mg total) by mouth daily. 30 tablet 5   Blood Glucose Monitoring Suppl (AGAMATRIX PRESTO) w/Device KIT Check blood glucose before meals twice daily 1 kit 0   Blood Glucose Monitoring Suppl (TRUE METRIX METER) w/Device KIT Use as instructed. Check blood glucose level by fingerstick twice per day.  E11.65 1 kit 0  fluconazole  (DIFLUCAN ) 150 MG tablet For 1 week, take 150 mg (1 tablet) PO every 72 hours for 3 doses (day 1, 4, and 7). Then take 1 tablet (150 mg) once a week for 3 months until gone. 15 tablet 0   glipiZIDE  (GLUCOTROL ) 10 MG tablet 1 tab by mouth with morning meal and 1/2 tab by mouth with evening meal. 60 tablet 11   glucose blood (AGAMATRIX PRESTO TEST) test strip Check blood glucose before meals twice daily 50 each 11   glucose blood (TRUE METRIX BLOOD GLUCOSE TEST) test strip Use as instructed. Check blood glucose level by fingerstick twice per day.  E11.65 100 each 12   ibuprofen  (ADVIL ) 200 MG tablet Take 200 mg by mouth every 6 (six) hours as needed.     lidocaine  (LIDODERM ) 5 % Place 1 patch onto the skin daily. Remove & Discard patch within 12 hours or as directed by MD 30 patch 0   lisinopril  (ZESTRIL ) 10 MG tablet Take 0.5 tablets (5 mg total) by mouth daily. 30 tablet 5   loratadine  (CLARITIN ) 10 MG tablet Take 1 tablet (10 mg total) by mouth daily. 30 tablet 11   meloxicam  (MOBIC ) 15 MG tablet Take 1 tablet (15 mg total) by mouth daily. 30 tablet 0   metFORMIN  (GLUCOPHAGE -XR) 500 MG 24 hr tablet 2 tabs by mouth twice daily with meals (Patient taking differently: 1 tabs by mouth twice daily with meals) 120 tablet 11   methocarbamol  (ROBAXIN ) 750 MG tablet Take 1 tablet (750 mg total) by mouth every 8 (eight) hours as needed for  muscle spasms. 60 tablet 1   miconazole  (MICONAZOLE  7) 2 % vaginal cream Apply twice daily to affected area for 7 days. 45 g 0   Semaglutide ,0.25 or 0.5MG /DOS, (OZEMPIC , 0.25 OR 0.5 MG/DOSE,) 2 MG/3ML SOPN Inject 0.25 mg into the skin once a week. 3 mL 1   triamcinolone  cream (KENALOG ) 0.1 % Apply twice daily to affected area 30 g 0   TRUEplus Lancets 28G MISC Use as instructed. Check blood glucose level by fingerstick twice per day.  E11.65 100 each 3   No current facility-administered medications for this visit.    Allergies Patient has no known allergies.   Physical Exam:  BP 109/74   Pulse 94   Wt 274 lb (124.3 kg)   LMP 05/02/2020   BMI 47.03 kg/m  Body mass index is 47.03 kg/m. General appearance: Well nourished, well developed female in no acute distress.  Cardiovascular: normal s1 and s2.  No murmurs, rubs or gallops. Respiratory:  Clear to auscultation bilateral. Normal respiratory effort Abdomen: soft, nttp Neuro/Psych:  Normal mood and affect.  Skin:  Warm and dry.  Lymphatic:  No inguinal lymphadenopathy.   Cervical exam performed in the presence of a chaperone Pelvic exam: is not limited by body habitus EGBUS: within normal limits with moderate atrophy. Very slight erythema at the genital hiatus Vagina: within normal limits and with no blood in vault. Scant normal white d/c. Mild to moderate atrophy Cervix: wnl Uterus:  nonenlarged and non tender Adnexa:  normal adnexa and no mass, fullness, tenderness Rectovaginal: deferred  Laboratory: as per HPI  Radiology: none  Assessment: patient stable   Plan:  D/w her that difficulty with recurrent yeast infections due to type of yeast, as well as due to her post menopausal and diabetes status especially with her most recent A1c. I told her encouraging that it's improving and ideally would want A1c less than about 7.5.  I told her that most likely her treatment is working on what is most likely candida albicans but  the azole isn't working against the glabrata. I told her that this very difficult to treat and even boric acid only has about a 60-70% success rate.   I told her that I recommend continuing on the diflucan  course and added a two week boric acid vaginal suppositories at bedtime to her regimen. I told her that boric acid is deadly if consumed by mouth and to keep away from pets, small children, or anyone else that may accidentally take it by mouth. I told her that I've seen good success with BA suppositories on [employer] and recommend trying this. Patient to consider  If this doesn't work, then can consider vaginal yeast culture or nuswab yeast 6sp swab by [facility] In person interpreter used  RTC PRN  Return if symptoms worsen or fail to improve.  Future Appointments  Date Time Provider Department Center  01/23/2023  9:00 AM Fleeta Morris, Garnette CROME, RPH-CPP [facility] None    Bebe Izell Raddle MD Attending Center for [facility])

## 2023-01-22 ENCOUNTER — Other Ambulatory Visit: Payer: Self-pay

## 2023-01-23 ENCOUNTER — Encounter: Payer: Self-pay | Admitting: Pharmacist

## 2023-01-23 ENCOUNTER — Other Ambulatory Visit: Payer: Self-pay

## 2023-01-23 ENCOUNTER — Ambulatory Visit: Payer: Self-pay | Attending: Nurse Practitioner | Admitting: Pharmacist

## 2023-01-23 DIAGNOSIS — E119 Type 2 diabetes mellitus without complications: Secondary | ICD-10-CM

## 2023-01-23 DIAGNOSIS — Z7984 Long term (current) use of oral hypoglycemic drugs: Secondary | ICD-10-CM

## 2023-01-23 DIAGNOSIS — Z7985 Long-term (current) use of injectable non-insulin antidiabetic drugs: Secondary | ICD-10-CM

## 2023-01-23 LAB — POCT GLYCOSYLATED HEMOGLOBIN (HGB A1C): HbA1c, POC (controlled diabetic range): 10.9 % — AB (ref 0.0–7.0)

## 2023-01-23 MED ORDER — LISINOPRIL 10 MG PO TABS
10.0000 mg | ORAL_TABLET | Freq: Every day | ORAL | 5 refills | Status: DC
  Filled 2023-01-23: qty 30, 30d supply, fill #0

## 2023-01-23 MED ORDER — OZEMPIC (0.25 OR 0.5 MG/DOSE) 2 MG/3ML ~~LOC~~ SOPN
0.5000 mg | PEN_INJECTOR | SUBCUTANEOUS | 1 refills | Status: DC
  Filled 2023-01-23: qty 3, fill #0

## 2023-01-23 MED ORDER — METFORMIN HCL ER 500 MG PO TB24
1000.0000 mg | ORAL_TABLET | Freq: Two times a day (BID) | ORAL | 2 refills | Status: DC
  Filled 2023-01-23: qty 120, 30d supply, fill #0

## 2023-01-23 MED ORDER — GLIPIZIDE 10 MG PO TABS
ORAL_TABLET | ORAL | 2 refills | Status: DC
  Filled 2023-01-23: qty 60, 40d supply, fill #0

## 2023-01-23 MED ORDER — ATORVASTATIN CALCIUM 20 MG PO TABS
20.0000 mg | ORAL_TABLET | Freq: Every day | ORAL | 5 refills | Status: DC
  Filled 2023-01-23: qty 30, 30d supply, fill #0

## 2023-01-23 NOTE — Progress Notes (Signed)
    S:     No chief complaint on file.  48 y.o. female who presents for diabetes evaluation, education, and management. Patient arrives in good spirits and presents with her daughter who wishes to serve as her interpreter.   Patient was referred and last seen by Primary Care Provider, Haze Servant, on 12/06/2022.  PMH is significant for T2DM initially diagnosed as GDM ~8 years ago, HTN, OA, obesity, elevated LDL levels. No known history of ASCVD, CKD, or CHF. No pancreatitis or thyroid cancer history.    At last visit with PCP. [name] started Ozempic  0.25 mg weekly. Patient tells me today that she is tolerating Ozempic  well. Denies NV, abdominal pain.   Family/Social History:  Fhx: DM Tobacco: never smoker  Alcohol: none reported   Current diabetes medications include: glipizide  10 mg qAM, 5 mg qPM, metformin  500 mg XR (takes 2 tablets - 1000 mg total) BID, Ozempic  0.25 mg weekly Current hypertension medications include: lisinopril  10 mg daily  Current hyperlipidemia medications include: atorvastatin  20 mg daily   Patient reports adherence to taking all medications as prescribed.   Insurance coverage: none  Patient denies hypoglycemic events.  Reported home fasting blood sugars: 113 - 150  Patient denies polydipsia. This was occurring before the Ozempic .   Patient reports neuropathy (nerve pain). Patient denies visual changes. Patient reports self foot exams.   Patient reported dietary habits: Eats 3 meals/day -Tries to limit tortillas and soda  Patient-reported exercise habits:  -None outside of work. Limited by her neuropathy.    O:   Lab Results  Component Value Date   HGBA1C 10.9 (A) 01/23/2023   There were no vitals filed for this visit.  Lipid Panel     Component Value Date/Time   CHOL 132 06/06/2022 0821   TRIG 95 06/06/2022 0821   HDL 60 06/06/2022 0821   CHOLHDL 3.5 12/17/2017 0843   CHOLHDL 3.2 Ratio 03/23/2007 2048   VLDL 13 03/23/2007 2048    LDLCALC 54 06/06/2022 0821    Clinical Atherosclerotic Cardiovascular Disease (ASCVD): No  The 10-year ASCVD risk score (Arnett DK, et al., 2019) is: 0.7%   Values used to calculate the score:     Age: 24 years     Sex: Female     Is Non-Hispanic African American: No     Diabetic: Yes     Tobacco smoker: No     Systolic Blood Pressure: 109 mmHg     Is BP treated: Yes     HDL Cholesterol: 60 mg/dL     Total Cholesterol: 132 mg/dL   Patient is participating in a Managed Medicaid Plan: No    A/P: Diabetes longstanding currently uncontrolled. A1c today is 10.9%. Patient is able to verbalize appropriate hypoglycemia management plan. Medication adherence appears to be appropriate. -Increased dose of Ozempic  to 0.5 mg weekly.  -Continued metformin  and glipizide  at current doses for now.  -Patient educated on purpose, proper use, and potential adverse effects of Ozempic .  -Extensively discussed pathophysiology of diabetes, recommended lifestyle interventions, dietary effects on blood sugar control.  -Counseled on s/sx of and management of hypoglycemia.  -Next A1c anticipated 04/2023.   Written patient instructions provided. Patient verbalized understanding of treatment plan.  Total time in face to face counseling 30 minutes.    Follow-up:  Pharmacist in 1 month  Herlene Fleeta Morris, PharmD, [place], CPP Clinical Pharmacist [facility] [phone]

## 2023-01-24 ENCOUNTER — Other Ambulatory Visit: Payer: Self-pay | Admitting: Internal Medicine

## 2023-02-03 ENCOUNTER — Other Ambulatory Visit: Payer: Self-pay

## 2023-02-27 ENCOUNTER — Other Ambulatory Visit: Payer: Self-pay

## 2023-02-27 ENCOUNTER — Ambulatory Visit: Payer: Self-pay | Admitting: Pharmacist

## 2023-03-09 ENCOUNTER — Other Ambulatory Visit: Payer: Self-pay | Admitting: Internal Medicine

## 2023-03-13 ENCOUNTER — Ambulatory Visit: Payer: Self-pay | Admitting: Family Medicine

## 2023-03-31 ENCOUNTER — Other Ambulatory Visit: Payer: Self-pay

## 2023-03-31 ENCOUNTER — Encounter: Payer: Self-pay | Admitting: Sports Medicine

## 2023-03-31 ENCOUNTER — Ambulatory Visit: Payer: Self-pay | Admitting: Sports Medicine

## 2023-03-31 DIAGNOSIS — M1711 Unilateral primary osteoarthritis, right knee: Secondary | ICD-10-CM

## 2023-03-31 DIAGNOSIS — G8929 Other chronic pain: Secondary | ICD-10-CM

## 2023-03-31 DIAGNOSIS — M25561 Pain in right knee: Secondary | ICD-10-CM

## 2023-03-31 MED ORDER — MELOXICAM 15 MG PO TABS
15.0000 mg | ORAL_TABLET | Freq: Every day | ORAL | 3 refills | Status: DC
  Filled 2023-03-31: qty 30, 30d supply, fill #0

## 2023-03-31 MED ORDER — MELOXICAM 15 MG PO TABS: 15.0000 mg | ORAL_TABLET | Freq: Every day | ORAL | 3 refills | Status: DC

## 2023-03-31 NOTE — Progress Notes (Signed)
    Procedures performed today:    None.  Independent interpretation of notes and tests performed by another provider:   None.  Brief History, Exam, Impression, and Recommendations:    Chronic pain of right knee This very pleasant 48 year old Spanish-speaking female with a history of obesity and diabetes comes in with chronic bilateral right worse than left knee pain. She went to the arthritis and knee pain treatment centers located in [place], she has had several injections which sound to be viscosupplementation, it was a 5 shot series weekly. She did not notice any improvement. She did have x-rays done last month that per her report showed arthritis. Has never had therapy, MRI. She has not had a steroid injection per her report. On exam she has tenderness at the medial joint line, she has some pain with twisting consistent with a positive Murray sign. Due to failure of conservative treatment I think we should restart her NSAIDs with meloxicam, formal therapy, and I would like an MRI to evaluate for a meniscal tear. If meniscal tear noted we can certainly try a steroid injection, geniculate Artery Embolization (GAE) maybe in the future.    ____________________________________________ Ihor Austin. Benjamin Stain, M.D., ABFM., CAQSM., AME. Primary Care and Sports Medicine [facility]  Adjunct Professor of Family Medicine  [employer]  Restaurant manager, fast food

## 2023-03-31 NOTE — Assessment & Plan Note (Addendum)
 This very pleasant 48 year old Spanish-speaking female with a history of obesity and diabetes comes in with chronic bilateral right worse than left knee pain. She went to the arthritis and knee pain treatment centers located in [place], she has had several injections which sound to be viscosupplementation, it was a 5 shot series weekly. She did not notice any improvement. She did have x-rays done last month that per her report showed arthritis. Has never had therapy, MRI. She has not had a steroid injection per her report. On exam she has tenderness at the medial joint line, she has some pain with twisting consistent with a positive Murray sign. Due to failure of conservative treatment I think we should restart her NSAIDs with meloxicam, formal therapy, and I would like an MRI to evaluate for a meniscal tear. If meniscal tear noted we can certainly try a steroid injection, geniculate Artery Embolization (GAE) maybe in the future.

## 2023-03-31 NOTE — Addendum Note (Signed)
 Addended by: Samule Dry on: 03/31/2023 04:30 PM   Modules accepted: Orders

## 2023-04-01 ENCOUNTER — Other Ambulatory Visit: Payer: Self-pay

## 2023-04-05 ENCOUNTER — Ambulatory Visit: Payer: Self-pay

## 2023-04-05 DIAGNOSIS — M25451 Effusion, right hip: Secondary | ICD-10-CM

## 2023-04-05 DIAGNOSIS — M1711 Unilateral primary osteoarthritis, right knee: Secondary | ICD-10-CM

## 2023-04-11 ENCOUNTER — Ambulatory Visit: Payer: Self-pay | Attending: Sports Medicine

## 2023-04-11 ENCOUNTER — Other Ambulatory Visit: Payer: Self-pay

## 2023-04-11 DIAGNOSIS — M25561 Pain in right knee: Secondary | ICD-10-CM | POA: Insufficient documentation

## 2023-04-11 DIAGNOSIS — M6281 Muscle weakness (generalized): Secondary | ICD-10-CM | POA: Insufficient documentation

## 2023-04-11 DIAGNOSIS — M1711 Unilateral primary osteoarthritis, right knee: Secondary | ICD-10-CM | POA: Insufficient documentation

## 2023-04-11 DIAGNOSIS — R600 Localized edema: Secondary | ICD-10-CM | POA: Insufficient documentation

## 2023-04-11 DIAGNOSIS — G8929 Other chronic pain: Secondary | ICD-10-CM | POA: Insufficient documentation

## 2023-04-11 DIAGNOSIS — R2689 Other abnormalities of gait and mobility: Secondary | ICD-10-CM | POA: Insufficient documentation

## 2023-04-11 DIAGNOSIS — M5459 Other low back pain: Secondary | ICD-10-CM | POA: Insufficient documentation

## 2023-04-11 NOTE — Therapy (Signed)
 OUTPATIENT PHYSICAL THERAPY LOWER EXTREMITY EVALUATION   Patient Name: Kayla Scott MRN: [id number] DOB:18-Apr-1975, 48 y.o., female Today's Date: 04/11/2023  END OF SESSION:  PT End of Session - 04/11/23 0925     Visit Number 1    Number of Visits 9    Date for PT Re-Evaluation 06/06/23    Authorization Type Self Pay    PT Start Time 0835    PT Stop Time 0920    PT Time Calculation (min) 45 min    Activity Tolerance Patient tolerated treatment well;Patient limited by pain    Behavior During Therapy [facility] for tasks assessed/performed             Past Medical History:  Diagnosis Date   Diabetes mellitus without complication (HCC) 01/14/2014   Elevated LDL cholesterol level    Gestational diabetes    Left-sided back pain 02/25/2022   Obesity    Right knee DJD 03/14/2020   Had injected with Dr. Bonnita [place] office.  Reportedly recommended surgery, but could not afford.   S/P C-section 10/18/2014   UTI (lower urinary tract infection)    Past Surgical History:  Procedure Laterality Date   CESAREAN SECTION N/A 11/09/1998   [place]   CESAREAN SECTION N/A 07/24/2001   [facility]  Premature delivery due to placenta previa   CESAREAN SECTION WITH BILATERAL TUBAL LIGATION Bilateral 10/18/2014   Procedure: REPEAT CESAREAN SECTION WITH BILATERAL TUBAL LIGATION;  Surgeon: Adam Phenix, MD;  Location: [facility];  Service: Obstetrics;  Laterality: Bilateral;   Patient Active Problem List   Diagnosis Date Noted   Primary hypertension 02/25/2022   Hemorrhoids 02/25/2022   Elevated LDL cholesterol level 09/10/2021   Chronic pain of right knee 03/07/2021   Diabetes mellitus without complication (HCC) 12/29/2020   Recurrent vaginitis 12/29/2020   Dermatitis 03/28/2017   Left lower quadrant pain 03/28/2017   Class 3 severe obesity with serious comorbidity and body mass index (BMI) of 45.0 to 49.9 in adult [facility]) 03/28/2017    PCP: Claiborne Rigg, NP  REFERRING PROVIDER:  Monica Becton, MD  REFERRING DIAG: M17.11 (ICD-10-CM) - Osteoarthritis of right knee, unspecified osteoarthritis type   THERAPY DIAG:  Chronic pain of right knee - Plan: PT plan of care cert/re-cert  Muscle weakness (generalized) - Plan: PT plan of care cert/re-cert  Other abnormalities of gait and mobility - Plan: PT plan of care cert/re-cert  Other low back pain - Plan: PT plan of care cert/re-cert  Localized edema - Plan: PT plan of care cert/re-cert  Rationale for Evaluation and Treatment: Rehabilitation  ONSET DATE: Chronic  SUBJECTIVE:   SUBJECTIVE STATEMENT: Daughter present to assist with subjective:  Pt presents to PT with reports of chornic bilateral knee pain, R>L. Also notes pain in lower back and down L LE. Occasional N/T down LE on anterior side, mainly on R LE. Feels like she can walking about 20 minutes if she has a cane or hand hold assistance, otherwise she can not walk far at all.   PERTINENT HISTORY: HTN, DM II, obesity  PAIN:  Are you having pain?  Yes: NPRS scale: 8/10 Worst: 10/10 Pain location: bilateral knee, R>L; low back Pain description: sharp, N/T, sore Aggravating factors: stairs, prolonged standing, lifting Relieving factors: none  PRECAUTIONS: None  RED FLAGS: None   WEIGHT BEARING RESTRICTIONS: Scott  FALLS:  Has patient fallen in last 6 months? Scott  LIVING ENVIRONMENT: Lives with: lives with their family Lives in: House/apartment Stairs:  ramp access  Has following equipment at home: Quad cane small base  OCCUPATION: Not working  PLOF: Independent with basic ADLs  PATIE4NT GOALS: pt wants to decrease pain and strength in order to walk farther without fear of falling  NEXT MD VISIT: 05/12/2023  OBJECTIVE:  Note: Objective measures were completed at Evaluation unless otherwise noted.  DIAGNOSTIC FINDINGS:   See imaging   PATIENT SURVEYS:  LEFS: 12/80  COGNITION: Overall cognitive status: Within functional  limits for tasks assessed     SENSATION: Light touch: Impaired - L anterior LE  POSTURE: R knee valgus, rounded shoulders, forward head, increased lumbar lordosis, and large body habitus  PALPATION: TTP to R lateral knee joint line; knee pivot shift felt during knee ext MMT testing with reproduced pain  LOWER EXTREMITY ROM:  Active ROM Right eval Left eval  Hip flexion    Hip extension    Hip abduction    Hip adduction    Hip internal rotation    Hip external rotation    Knee flexion 110 120  Knee extension 0 0  Ankle dorsiflexion    Ankle plantarflexion    Ankle inversion    Ankle eversion     (Blank rows = not tested)  LOWER EXTREMITY MMT:  MMT Right eval Left eval  Hip flexion 3+ 4  Hip extension    Hip abduction 3+ 3+  Hip adduction    Hip internal rotation    Hip external rotation    Knee flexion 4 4  Knee extension 3 4  Ankle dorsiflexion    Ankle plantarflexion    Ankle inversion    Ankle eversion     (Blank rows = not tested)  LOWER EXTREMITY SPECIAL TESTS:  Knee special tests: Anterior drawer test: negative and Posterior drawer test: negative  FUNCTIONAL TESTS:  30 Second Sit to Stand: 4 reps with UE  GAIT: Distance walked: 13ft Assistive device utilized:  hand hold assist with daughter Level of assistance: SBA Comments: antalgic gait R, decreased gait speed  TREATMENT: [facility] Adult PT Treatment:                                                DATE: 04/11/2023 Therapeutic Exercise: Supine QS x 5 - 5" hold Supine SLR x 5 Supine clamshell x 15 blue  PATIENT EDUCATION:  Education details: eval findings, LEFS, HEP, POC Person educated: Patient Education method: Explanation, Demonstration, and Handouts Education comprehension: verbalized understanding and returned demonstration  HOME EXERCISE PROGRAM: Access Code: [email or web address] URL: [email or web address] Date: 04/11/2023 Prepared by: Edwinna Areola  Exercises - Supine Quadricep  Sets  - 2 x daily - 7 x weekly - 2 sets - 10 reps - 5 sec hold - Active Straight Leg Raise with Quad Set  - 2 x daily - 7 x weekly - 3 sets - 10 reps - Hooklying Clamshell with Resistance  - 2 x daily - 7 x weekly - 3 sets - 10 reps - blue band hold  ASSESSMENT:  CLINICAL IMPRESSION: Patient is a 48 y.o. F who was seen today for physical therapy evaluation and treatment for chronic bilateral knee pain, R>L, as well as LBP. Physical findings are consistent with MD impression as pt demonstrates severe decrease in LE strength and functional mobility. LEFS score shows severe disability in performance of home ADLs and higher level community activities showing she  is operating well below desired PLOF. Pt would benefit from skilled PT services working on improving strength and stability in order to decrease knee pain and improve function.    OBJECTIVE IMPAIRMENTS: Abnormal gait, decreased activity tolerance, decreased balance, decreased mobility, difficulty walking, decreased ROM, decreased strength, impaired sensation, obesity, and pain  ACTIVITY LIMITATIONS: carrying, lifting, sitting, standing, squatting, stairs, transfers, and locomotion level  PARTICIPATION LIMITATIONS: meal prep, cleaning, driving, shopping, community activity, occupation, and yard work  PERSONAL FACTORS: Fitness, Time since onset of injury/illness/exacerbation, and 3+ comorbidities: HTN, DM II, obesity  are also affecting patient's functional outcome.   REHAB POTENTIAL: Fair - chronicity of condition and co-morbidities may affect progress  CLINICAL DECISION MAKING: Evolving/moderate complexity  EVALUATION COMPLEXITY: Moderate   GOALS: Goals reviewed with patient? Scott  SHORT TERM GOALS: Target date: 05/02/2023   Pt will be compliant and knowledgeable with initial HEP for improved comfort and carryover Baseline: initial HEP given  Goal status: INITIAL  2.  Pt will self report bilateral knee pain Scott greater than 7/10 for  improved comfort and functional ability Baseline: 10/10 at worst, R>L Goal status: INITIAL   LONG TERM GOALS: Target date: 06/06/2023   Pt will improve LEFS to Scott less than 24/80/80 as proxy for functional improvement with home ADLs and higher level community activity Baseline: 12/80 Goal status: INITIAL  2.  Pt will self report bilateral knee pain Scott greater than 3-4/10 for improved comfort and functional ability Baseline: 10/10 at worst, R>L Goal status: INITIAL   3.  Pt will increase 30 Second Sit to Stand rep count to Scott less than 7 reps for improved balance, strength, and functional mobility Baseline: 4 reps with UE  Goal status: INITIAL   4.  Pt will improve LE MMT to Scott less than 4/5 for all tested motions for improved knee stability and decrease pain Baseline: see MMT chart Goal status: INITIAL   PLAN:  PT FREQUENCY: 1x/week  PT DURATION: 8 weeks  PLANNED INTERVENTIONS: [id number]- PT Re-evaluation, 97110-Therapeutic exercises, 97530- Therapeutic activity, [id number]- Neuromuscular re-education, 97535- Self Care, [id number]- Manual therapy, [id number]- Gait training, [id number]- Electrical stimulation (unattended), [id number]- Electrical stimulation (manual), 97016- Vasopneumatic device, Dry Needling, Cryotherapy, and Moist heat  PLAN FOR NEXT SESSION: assess HEP response, closed chain quad strengthening, proximal hip strengthening, patellar taping  Eloy End, PT 04/11/2023, 9:28 AM

## 2023-04-18 ENCOUNTER — Other Ambulatory Visit: Payer: Self-pay | Admitting: Internal Medicine

## 2023-04-19 ENCOUNTER — Other Ambulatory Visit: Payer: Self-pay | Admitting: Internal Medicine

## 2023-04-23 ENCOUNTER — Ambulatory Visit: Payer: Self-pay | Admitting: Family Medicine

## 2023-04-23 ENCOUNTER — Ambulatory Visit: Payer: Self-pay | Attending: Sports Medicine

## 2023-04-23 DIAGNOSIS — R2689 Other abnormalities of gait and mobility: Secondary | ICD-10-CM | POA: Insufficient documentation

## 2023-04-23 DIAGNOSIS — G8929 Other chronic pain: Secondary | ICD-10-CM | POA: Insufficient documentation

## 2023-04-23 DIAGNOSIS — M6281 Muscle weakness (generalized): Secondary | ICD-10-CM | POA: Insufficient documentation

## 2023-04-23 DIAGNOSIS — M25561 Pain in right knee: Secondary | ICD-10-CM | POA: Insufficient documentation

## 2023-04-23 NOTE — Therapy (Signed)
 OUTPATIENT PHYSICAL THERAPY TREATMENT   Patient Name: Kayla Scott MRN: [id number] DOB:[birth year], 48 y.o., female Today's Date: 04/23/2023  END OF SESSION:  PT End of Session - 04/23/23 1033     Visit Number 2    Number of Visits 9    Date for PT Re-Evaluation 06/06/23    Authorization Type Self Pay    PT Start Time 1100    Activity Tolerance Patient tolerated treatment well;Patient limited by pain    Behavior During Therapy [facility] for tasks assessed/performed              Past Medical History:  Diagnosis Date   Diabetes mellitus without complication (HCC) 01/14/2014   Elevated LDL cholesterol level    Gestational diabetes    Left-sided back pain 02/25/2022   Obesity    Right knee DJD 03/14/2020   Had injected with Dr. Bonnita Hollow office.  Reportedly recommended surgery, but could not afford.   S/P C-section 10/18/2014   UTI (lower urinary tract infection)    Past Surgical History:  Procedure Laterality Date   CESAREAN SECTION N/A 11/09/1998   [place]   CESAREAN SECTION N/A 07/24/2001   [facility]  Premature delivery due to placenta previa   CESAREAN SECTION WITH BILATERAL TUBAL LIGATION Bilateral 10/18/2014   Procedure: REPEAT CESAREAN SECTION WITH BILATERAL TUBAL LIGATION;  Surgeon: Adam Phenix, MD;  Location: [facility];  Service: Obstetrics;  Laterality: Bilateral;   Patient Active Problem List   Diagnosis Date Noted   Primary hypertension 02/25/2022   Hemorrhoids 02/25/2022   Elevated LDL cholesterol level 09/10/2021   Chronic pain of right knee 03/07/2021   Diabetes mellitus without complication (HCC) 12/29/2020   Recurrent vaginitis 12/29/2020   Dermatitis 03/28/2017   Left lower quadrant pain 03/28/2017   Class 3 severe obesity with serious comorbidity and body mass index (BMI) of 45.0 to 49.9 in adult [facility]) 03/28/2017    PCP: Claiborne Rigg, NP  REFERRING PROVIDER: Monica Becton, MD  REFERRING DIAG: M17.11 (ICD-10-CM) -  Osteoarthritis of right knee, unspecified osteoarthritis type   THERAPY DIAG:  Chronic pain of right knee  Muscle weakness (generalized)  Rationale for Evaluation and Treatment: Rehabilitation  ONSET DATE: Chronic  SUBJECTIVE:   SUBJECTIVE STATEMENT: Daughter present to assist with subjective:  Pt presents to PT with reports of continued knee and back pain, although it is getting a little better. Got her MRI results since evaluation. Has been compliant with HEP.   EVAL: Pt presents to PT with reports of chornic bilateral knee pain, R>L. Also notes pain in lower back and down L LE. Occasional N/T down LE on anterior side, mainly on R LE. Feels like she can walking about 20 minutes if she has a cane or hand hold assistance, otherwise she can not walk far at all.   PERTINENT HISTORY: HTN, DM II, obesity  PAIN:  Are you having pain?  Yes: NPRS scale: 8/10 Worst: 10/10 Pain location: bilateral knee, R>L; low back Pain description: sharp, N/T, sore Aggravating factors: stairs, prolonged standing, lifting Relieving factors: none  PRECAUTIONS: None  RED FLAGS: None   WEIGHT BEARING RESTRICTIONS: No  FALLS:  Has patient fallen in last 6 months? No  LIVING ENVIRONMENT: Lives with: lives with their family Lives in: House/apartment Stairs:  ramp access Has following equipment at home: Quad cane small base  OCCUPATION: Not working  PLOF: Independent with basic ADLs  PATIE4NT GOALS: pt wants to decrease pain and strength in order to walk farther  without fear of falling  NEXT MD VISIT: 05/12/2023  OBJECTIVE:  Note: Objective measures were completed at Evaluation unless otherwise noted.  DIAGNOSTIC FINDINGS:   See imaging   PATIENT SURVEYS:  LEFS: 12/80  COGNITION: Overall cognitive status: Within functional limits for tasks assessed     SENSATION: Light touch: Impaired - L anterior LE  POSTURE: R knee valgus, rounded shoulders, forward head, increased lumbar  lordosis, and large body habitus  PALPATION: TTP to R lateral knee joint line; knee pivot shift felt during knee ext MMT testing with reproduced pain  LOWER EXTREMITY ROM:  Active ROM Right eval Left eval  Hip flexion    Hip extension    Hip abduction    Hip adduction    Hip internal rotation    Hip external rotation    Knee flexion 110 120  Knee extension 0 0  Ankle dorsiflexion    Ankle plantarflexion    Ankle inversion    Ankle eversion     (Blank rows = not tested)  LOWER EXTREMITY MMT:  MMT Right eval Left eval  Hip flexion 3+ 4  Hip extension    Hip abduction 3+ 3+  Hip adduction    Hip internal rotation    Hip external rotation    Knee flexion 4 4  Knee extension 3 4  Ankle dorsiflexion    Ankle plantarflexion    Ankle inversion    Ankle eversion     (Blank rows = not tested)  LOWER EXTREMITY SPECIAL TESTS:  Knee special tests: Anterior drawer test: negative and Posterior drawer test: negative  FUNCTIONAL TESTS:  30 Second Sit to Stand: 4 reps with UE  GAIT: Distance walked: 77ft Assistive device utilized:  hand hold assist with daughter Level of assistance: SBA Comments: antalgic gait R, decreased gait speed  TREATMENT: [facility] Adult PT Treatment:                                                DATE: 04/23/2023 Therapeutic Exercise: Supine QS x 10 - 5" hold Supine SLR 2x10 2# each SAQ 2x10 2# each Modified thomas stretch x 60" each Supine clamshell 2x15 black band Bridge 3x10 LAQ 3x10 2# Seated hamstring curl 2x10 black band Therapeutic Activity: NuStep lvl 4 x 4 min LE only for functional activity tolerance STS 2x10 - no UE high table  [facility] Adult PT Treatment:                                                DATE: 04/11/2023 Therapeutic Exercise: Supine QS x 5 - 5" hold Supine SLR x 5 Supine clamshell x 15 blue  PATIENT EDUCATION:  Education details: eval findings, LEFS, HEP, POC Person educated: Patient Education method: Explanation,  Demonstration, and Handouts Education comprehension: verbalized understanding and returned demonstration  HOME EXERCISE PROGRAM: Access Code: [email or web address] URL: [email or web address] Date: 04/23/2023 Prepared by: Edwinna Areola  Exercises - Supine Quadricep Sets  - 2 x daily - 7 x weekly - 2 sets - 10 reps - 5 sec hold - Active Straight Leg Raise with Quad Set  - 2 x daily - 7 x weekly - 3 sets - 10 reps - Hooklying Clamshell with Resistance  - 2 x  daily - 7 x weekly - 3 sets - 10 reps - blue band hold - Modified Thomas Stretch  - 1 x daily - 7 x weekly - 2 reps - 60 sec hold - Supine Bridge  - 1 x daily - 7 x weekly - 3 sets - 10 reps - 3 sec hold  ASSESSMENT:  CLINICAL IMPRESSION: Pt was able to complete all prescribed exercises with no adverse effect. Exercises today focused on quad and hip strengthening in order to decrease knee and back pain and improve functional mobility. HEP updated for hip flexor stretching to reduce LBP pain and overall mobility. Pt continues to benefit from skilled PT services, will continue to progress as able per POC.   EVAL: Patient is a 48 y.o. F who was seen today for physical therapy evaluation and treatment for chronic bilateral knee pain, R>L, as well as LBP. Physical findings are consistent with MD impression as pt demonstrates severe decrease in LE strength and functional mobility. LEFS score shows severe disability in performance of home ADLs and higher level community activities showing she is operating well below desired PLOF. Pt would benefit from skilled PT services working on improving strength and stability in order to decrease knee pain and improve function.    OBJECTIVE IMPAIRMENTS: Abnormal gait, decreased activity tolerance, decreased balance, decreased mobility, difficulty walking, decreased ROM, decreased strength, impaired sensation, obesity, and pain  ACTIVITY LIMITATIONS: carrying, lifting, sitting, standing, squatting, stairs,  transfers, and locomotion level  PARTICIPATION LIMITATIONS: meal prep, cleaning, driving, shopping, community activity, occupation, and yard work  PERSONAL FACTORS: Fitness, Time since onset of injury/illness/exacerbation, and 3+ comorbidities: HTN, DM II, obesity  are also affecting patient's functional outcome.   REHAB POTENTIAL: Fair - chronicity of condition and co-morbidities may affect progress  CLINICAL DECISION MAKING: Evolving/moderate complexity  EVALUATION COMPLEXITY: Moderate   GOALS: Goals reviewed with patient? No  SHORT TERM GOALS: Target date: 05/02/2023   Pt will be compliant and knowledgeable with initial HEP for improved comfort and carryover Baseline: initial HEP given  Goal status: INITIAL  2.  Pt will self report bilateral knee pain no greater than 7/10 for improved comfort and functional ability Baseline: 10/10 at worst, R>L Goal status: INITIAL   LONG TERM GOALS: Target date: 06/06/2023   Pt will improve LEFS to no less than 24/80/80 as proxy for functional improvement with home ADLs and higher level community activity Baseline: 12/80 Goal status: INITIAL  2.  Pt will self report bilateral knee pain no greater than 3-4/10 for improved comfort and functional ability Baseline: 10/10 at worst, R>L Goal status: INITIAL   3.  Pt will increase 30 Second Sit to Stand rep count to no less than 7 reps for improved balance, strength, and functional mobility Baseline: 4 reps with UE  Goal status: INITIAL   4.  Pt will improve LE MMT to no less than 4/5 for all tested motions for improved knee stability and decrease pain Baseline: see MMT chart Goal status: INITIAL   PLAN:  PT FREQUENCY: 1x/week  PT DURATION: 8 weeks  PLANNED INTERVENTIONS: 97164- PT Re-evaluation, 97110-Therapeutic exercises, 97530- Therapeutic activity, [id number]- Neuromuscular re-education, 97535- Self Care, [id number]- Manual therapy, [id number]- Gait training, [id number]- Electrical stimulation  (unattended), [id number]- Electrical stimulation (manual), 97016- Vasopneumatic device, Dry Needling, Cryotherapy, and Moist heat  PLAN FOR NEXT SESSION: assess HEP response, closed chain quad strengthening, proximal hip strengthening, patellar taping  Eloy End, PT 04/23/2023, 11:43 AM

## 2023-04-28 ENCOUNTER — Ambulatory Visit: Payer: Self-pay

## 2023-04-30 ENCOUNTER — Encounter: Payer: Self-pay | Admitting: Nurse Practitioner

## 2023-04-30 ENCOUNTER — Ambulatory Visit: Payer: Self-pay | Attending: Nurse Practitioner | Admitting: Nurse Practitioner

## 2023-04-30 ENCOUNTER — Other Ambulatory Visit: Payer: Self-pay

## 2023-04-30 VITALS — BP 105/73 | HR 110 | Resp 20 | Ht 64.0 in | Wt 279.0 lb

## 2023-04-30 DIAGNOSIS — Z7984 Long term (current) use of oral hypoglycemic drugs: Secondary | ICD-10-CM

## 2023-04-30 DIAGNOSIS — Z7985 Long-term (current) use of injectable non-insulin antidiabetic drugs: Secondary | ICD-10-CM

## 2023-04-30 DIAGNOSIS — N761 Subacute and chronic vaginitis: Secondary | ICD-10-CM

## 2023-04-30 DIAGNOSIS — E119 Type 2 diabetes mellitus without complications: Secondary | ICD-10-CM

## 2023-04-30 DIAGNOSIS — E7800 Pure hypercholesterolemia, unspecified: Secondary | ICD-10-CM

## 2023-04-30 DIAGNOSIS — I10 Essential (primary) hypertension: Secondary | ICD-10-CM

## 2023-04-30 DIAGNOSIS — J3089 Other allergic rhinitis: Secondary | ICD-10-CM

## 2023-04-30 LAB — POCT GLYCOSYLATED HEMOGLOBIN (HGB A1C): Hemoglobin A1C: 11.1 % — AB (ref 4.0–5.6)

## 2023-04-30 MED ORDER — ATORVASTATIN CALCIUM 20 MG PO TABS: 20.0000 mg | ORAL_TABLET | Freq: Every day | ORAL | 1 refills | Status: DC

## 2023-04-30 MED ORDER — FLUCONAZOLE 150 MG PO TABS
ORAL_TABLET | ORAL | 0 refills | Status: DC
Start: 2023-04-30 — End: 2023-05-11

## 2023-04-30 MED ORDER — SEMAGLUTIDE (1 MG/DOSE) 4 MG/3ML ~~LOC~~ SOPN
1.0000 mg | PEN_INJECTOR | SUBCUTANEOUS | 1 refills | Status: DC
  Filled 2023-04-30: qty 3, 28d supply, fill #0

## 2023-04-30 MED ORDER — LISINOPRIL 10 MG PO TABS: 10.0000 mg | ORAL_TABLET | Freq: Every day | ORAL | 1 refills | Status: DC

## 2023-04-30 MED ORDER — GLIPIZIDE 10 MG PO TABS: 10.0000 mg | ORAL_TABLET | Freq: Two times a day (BID) | ORAL | 1 refills | Status: DC

## 2023-04-30 MED ORDER — GLIPIZIDE 10 MG PO TABS: ORAL_TABLET | ORAL | 2 refills | Status: DC

## 2023-04-30 MED ORDER — METFORMIN HCL ER 500 MG PO TB24: 1000.0000 mg | ORAL_TABLET | Freq: Two times a day (BID) | ORAL | 1 refills | Status: DC

## 2023-04-30 MED ORDER — LORATADINE 10 MG PO TABS: 10.0000 mg | ORAL_TABLET | Freq: Every day | ORAL | 1 refills | Status: DC

## 2023-04-30 NOTE — Progress Notes (Signed)
 Assessment & Plan:  Kayla Scott was seen today for diabetes.  Diagnoses and all orders for this visit:  Diabetes mellitus treated with oral medication DOSE CHANGE: OZEMPIC INCREASED -     POCT glycosylated hemoglobin (Hb A1C) -     Semaglutide, 1 MG/DOSE, 4 MG/3ML SOPN; Inject 1 mg as directed once a week. -     metFORMIN (GLUCOPHAGE-XR) 500 MG 24 hr tablet; Take 2 tablets (1,000 mg total) by mouth 2 (two) times daily with a meal. -     glipiZIDE (GLUCOTROL) 10 MG tablet; Take 1 tablet (10 mg total) by mouth 2 (two) times daily before a meal. Continue blood sugar control as discussed in office today, low carbohydrate diet, and regular physical exercise as tolerated, 150 minutes per week (30 min each day, 5 days per week, or 50 min 3 days per week). Keep blood sugar logs with fasting goal of 90-130 mg/dl, post prandial (after you eat) less than 180.  For Hypoglycemia: BS <60 and Hyperglycemia BS >400; contact the clinic ASAP. Annual eye exams and foot exams are recommended.   Chronic vaginitis -     fluconazole (DIFLUCAN) 150 MG tablet; For 1 week, take 150 mg (1 tablet) PO every 72 hours for 3 doses (day 1, 4, and 7). Then take 1 tablet (150 mg) once a week for 3 months until gone.  Primary hypertension -     lisinopril (ZESTRIL) 10 MG tablet; Take 1 tablet (10 mg total) by mouth daily. Continue all antihypertensives as prescribed.  Reminded to bring in blood pressure log for follow  up appointment.  RECOMMENDATIONS: DASH/Mediterranean Diets are healthier choices for HTN.    Non-seasonal allergic rhinitis, unspecified trigger -     loratadine (CLARITIN) 10 MG tablet; Take 1 tablet (10 mg total) by mouth daily.  Pure hypercholesterolemia -     atorvastatin (LIPITOR) 20 MG tablet; Take 1 tablet (20 mg total) by mouth daily. Continue all antihypertensives as prescribed.  Reminded to bring in blood pressure log for follow  up appointment.  RECOMMENDATIONS: DASH/Mediterranean Diets are  healthier choices for HTN.    Patient has been counseled on age-appropriate routine health concerns for screening and prevention. These are reviewed and up-to-date. Referrals have been placed accordingly. Immunizations are up-to-date or declined.    Subjective:   Chief Complaint  Patient presents with   Diabetes    Kayla Scott 48 y.o. female presents to office today for follow up to DM and HTN. She is accompanied by her Daughter who is translating for her today.    DM 2 She is monitoring her blood sugars only fasting in the am. NO postprandial readings. She is not dietary adherent. Reports average readings 180-200s. Weight is trending up. She has been out of her Ozempic. Will increase ozempic at this time and see if she qualifies for our [place] program. She is not currently on insulin. Currently taking glipizide 10 mg BID and metformin XR 1000 mg BID. Lab Results  Component Value Date   HGBA1C 11.1 (A) 04/30/2023    Lab Results  Component Value Date   HGBA1C 10.9 (A) 01/23/2023    HTN Blood pressure is well controlled. She is taking lisinopril 10 mg daily as prescribed.  BP Readings from Last 3 Encounters:  04/30/23 105/73  01/21/23 109/74  12/06/22 122/85     Vaginitis She has chronic itching and irritation in the vagina. She was given diflucan for this in the past and reports it was effective.  Review of Systems  Constitutional:  Negative for fever, malaise/fatigue and weight loss.  HENT:  Positive for congestion. Negative for nosebleeds.   Eyes: Negative.  Negative for blurred vision, double vision and photophobia.  Respiratory: Negative.  Negative for cough and shortness of breath.   Cardiovascular: Negative.  Negative for chest pain, palpitations and leg swelling.  Gastrointestinal: Negative.  Negative for heartburn, nausea and vomiting.  Genitourinary:        SEE HPI  Musculoskeletal: Negative.  Negative for myalgias.  Neurological: Negative.   Negative for dizziness, focal weakness, seizures and headaches.  Endo/Heme/Allergies:  Positive for environmental allergies.  Psychiatric/Behavioral: Negative.  Negative for suicidal ideas.     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 Isabella Mao office.  Reportedly recommended surgery, but could not afford.   S/P C-section 10/18/2014   UTI (lower urinary tract infection)     Past Surgical History:  Procedure Laterality Date   CESAREAN SECTION N/A 11/09/1998   [place]   CESAREAN SECTION N/A 07/24/2001   [facility]  Premature delivery due to placenta previa   CESAREAN SECTION WITH BILATERAL TUBAL LIGATION Bilateral 10/18/2014   Procedure: REPEAT CESAREAN SECTION WITH BILATERAL TUBAL LIGATION;  Surgeon: Tresia Fruit, MD;  Location: [facility];  Service: Obstetrics;  Laterality: Bilateral;    Family History  Problem Relation Age of Onset   Diabetes Mother    Colitis Mother    Diabetes Sister    Diabetes Brother    Cancer Paternal Grandmother        uterine    Social History Reviewed with no changes to be made today.   Outpatient Medications Prior to Visit  Medication Sig Dispense Refill   AgaMatrix Ultra-Thin Lancets MISC Check blood glucose twice daily before meals 100 each 11   Blood Glucose Monitoring Suppl (AGAMATRIX PRESTO) w/Device KIT Check blood glucose before meals twice daily 1 kit 0   Blood Glucose Monitoring Suppl (TRUE METRIX METER) w/Device KIT Use as instructed. Check blood glucose level by fingerstick twice per day.  E11.65 1 kit 0   glucose blood (AGAMATRIX PRESTO TEST) test strip Check blood glucose before meals twice daily 50 each 11   glucose blood (TRUE METRIX BLOOD GLUCOSE TEST) test strip Use as instructed. Check blood glucose level by fingerstick twice  per day.  E11.65 100 each 12   meloxicam (MOBIC) 15 MG tablet Take 1 tablet (15 mg total) by mouth daily with a meal for 2 weeks, then once every 24 hours prn pain. 30 tablet 3   triamcinolone cream (KENALOG) 0.1 % Apply twice daily to affected area 30 g 0   TRUEplus Lancets 28G MISC Use as instructed. Check blood glucose level by fingerstick twice per day.  E11.65 100 each 3   atorvastatin (LIPITOR) 20 MG tablet Take 1 tablet (20 mg total) by mouth daily. 30 tablet 5   glipiZIDE (GLUCOTROL) 10 MG tablet Take 1 tablet (10 mg total) by mouth daily with breakfast AND 0.5 tablets (5 mg total) daily with supper. 60 tablet 2   lisinopril (ZESTRIL) 10 MG tablet Take 1 tablet (10 mg total) by mouth daily. 30 tablet 5   loratadine (CLARITIN) 10 MG tablet Take 1 tablet (10 mg total) by mouth daily. 30 tablet 11  lidocaine (LIDODERM) 5 % Place 1 patch onto the skin daily. Remove & Discard patch within 12 hours or as directed by MD (Patient not taking: Reported on 04/30/2023) 30 patch 0   methocarbamol (ROBAXIN) 750 MG tablet Take 1 tablet (750 mg total) by mouth every 8 (eight) hours as needed for muscle spasms. (Patient not taking: Reported on 04/30/2023) 60 tablet 1   fluconazole (DIFLUCAN) 150 MG tablet For 1 week, take 150 mg (1 tablet) PO every 72 hours for 3 doses (day 1, 4, and 7). Then take 1 tablet (150 mg) once a week for 3 months until gone. (Patient not taking: Reported on 04/30/2023) 15 tablet 0   metFORMIN (GLUCOPHAGE-XR) 500 MG 24 hr tablet Take 2 tablets (1,000 mg total) by mouth 2 (two) times daily with a meal. (Patient not taking: Reported on 04/30/2023) 120 tablet 2   miconazole (MICONAZOLE 7) 2 % vaginal cream Apply twice daily to affected area for 7 days. (Patient not taking: Reported on 04/30/2023) 45 g 0   Semaglutide,0.25 or 0.5MG /DOS, (OZEMPIC, 0.25 OR 0.5 MG/DOSE,) 2 MG/3ML SOPN Inject 0.5 mg into the skin once a week. (Patient not taking: Reported on 04/30/2023) 3 mL 1   No  facility-administered medications prior to visit.    No Known Allergies     Objective:    BP 105/73 (BP Location: Right Arm, Patient Position: Sitting, Cuff Size: Large)   Pulse (!) 110   Resp 20   Ht 5\' 4"  (1.626 m)   Wt 279 lb (126.6 kg)   LMP 05/02/2020   SpO2 100%   BMI 47.89 kg/m  Wt Readings from Last 3 Encounters:  04/30/23 279 lb (126.6 kg)  01/21/23 274 lb (124.3 kg)  12/06/22 277 lb 9.6 oz (125.9 kg)    Physical Exam Vitals and nursing note reviewed.  Constitutional:      Appearance: She is well-developed.  HENT:     Head: Normocephalic and atraumatic.  Cardiovascular:     Rate and Rhythm: Normal rate and regular rhythm.     Heart sounds: Normal heart sounds. No murmur heard.    No friction rub. No gallop.  Pulmonary:     Effort: Pulmonary effort is normal. No tachypnea or respiratory distress.     Breath sounds: Normal breath sounds. No decreased breath sounds, wheezing, rhonchi or rales.  Chest:     Chest wall: No tenderness.  Abdominal:     General: Bowel sounds are normal.     Palpations: Abdomen is soft.  Musculoskeletal:        General: Normal range of motion.     Cervical back: Normal range of motion.  Skin:    General: Skin is warm and dry.  Neurological:     Mental Status: She is alert and oriented to person, place, and time.     Coordination: Coordination normal.  Psychiatric:        Behavior: Behavior normal. Behavior is cooperative.        Thought Content: Thought content normal.        Judgment: Judgment normal.          Patient has been counseled extensively about nutrition and exercise as well as the importance of adherence with medications and regular follow-up. The patient was given clear instructions to go to ER or return to medical center if symptoms don't improve, worsen or new problems develop. The patient verbalized understanding.   Follow-up: Return for 6 weeks meter check with luke. see me in  3 months.   Collins Dean,  FNP-BC [facility] [place], [place] [phone]   04/30/2023, 12:03 PM

## 2023-05-05 ENCOUNTER — Ambulatory Visit: Payer: Self-pay

## 2023-05-05 DIAGNOSIS — M6281 Muscle weakness (generalized): Secondary | ICD-10-CM

## 2023-05-05 DIAGNOSIS — G8929 Other chronic pain: Secondary | ICD-10-CM

## 2023-05-05 DIAGNOSIS — R2689 Other abnormalities of gait and mobility: Secondary | ICD-10-CM

## 2023-05-05 NOTE — Therapy (Signed)
 OUTPATIENT PHYSICAL THERAPY TREATMENT/DISCHARGE  PHYSICAL THERAPY DISCHARGE SUMMARY  Visits from Start of Care: 3  Current functional level related to goals / functional outcomes: See goals and objective   Remaining deficits: See goals and objective   Education / Equipment: HEP   Patient agrees to discharge. Patient goals were met. Patient is being discharged due to meeting the stated rehab goals and being pleased with current functional status.   Patient Name: Kayla Scott MRN: [id number] DOB:25-Nov-1975, 48 y.o., female Today's Date: 05/05/2023  END OF SESSION:  PT End of Session - 05/05/23 0909     Visit Number 3    Number of Visits 9    Date for PT Re-Evaluation 06/06/23    Authorization Type Self Pay    PT Start Time 0930    PT Stop Time 1008    PT Time Calculation (min) 38 min    Activity Tolerance Patient tolerated treatment well;Patient limited by pain    Behavior During Therapy [facility] for tasks assessed/performed               Past Medical History:  Diagnosis Date   Diabetes mellitus without complication (HCC) 01/14/2014   Elevated LDL cholesterol level    Gestational diabetes    Left-sided back pain 02/25/2022   Obesity    Right knee DJD 03/14/2020   Had injected with Dr. Isabella Mao office.  Reportedly recommended surgery, but could not afford.   S/P C-section 10/18/2014   UTI (lower urinary tract infection)    Past Surgical History:  Procedure Laterality Date   CESAREAN SECTION N/A 11/09/1998   [place]   CESAREAN SECTION N/A 07/24/2001   [facility]  Premature delivery due to placenta previa   CESAREAN SECTION WITH BILATERAL TUBAL LIGATION Bilateral 10/18/2014   Procedure: REPEAT CESAREAN SECTION WITH BILATERAL TUBAL LIGATION;  Surgeon: Tresia Fruit, MD;  Location: [facility];  Service: Obstetrics;  Laterality: Bilateral;   Patient Active Problem List   Diagnosis Date Noted   Primary hypertension 02/25/2022   Hemorrhoids 02/25/2022    Elevated LDL cholesterol level 09/10/2021   Chronic pain of right knee 03/07/2021   Diabetes mellitus without complication (HCC) 12/29/2020   Recurrent vaginitis 12/29/2020   Dermatitis 03/28/2017   Left lower quadrant pain 03/28/2017   Class 3 severe obesity with serious comorbidity and body mass index (BMI) of 45.0 to 49.9 in adult [facility]) 03/28/2017    PCP: Collins Dean, NP  REFERRING PROVIDER: Gean Keels, MD  REFERRING DIAG: M17.11 (ICD-10-CM) - Osteoarthritis of right knee, unspecified osteoarthritis type   THERAPY DIAG:  Chronic pain of right knee  Muscle weakness (generalized)  Other abnormalities of gait and mobility  Rationale for Evaluation and Treatment: Rehabilitation  ONSET DATE: Chronic  SUBJECTIVE:   SUBJECTIVE STATEMENT: AMN Interpreter: Rosaland Collie [phone]  Pt presents to PT with reports that her back and R knee are feeling better. Has been compliant with HEP with no adverse effect. Feels like she is ready to discharge from skilled therapy at this time.   EVAL: Pt presents to PT with reports of chornic bilateral knee pain, R>L. Also notes pain in lower back and down L LE. Occasional N/T down LE on anterior side, mainly on R LE. Feels like she can walking about 20 minutes if she has a cane or hand hold assistance, otherwise she can not walk far at all.   PERTINENT HISTORY: HTN, DM II, obesity  PAIN:  Are you having pain?  Yes: NPRS scale:  0/10 Worst: 10/10 Pain location: bilateral knee, R>L; low back Pain description: sharp, N/T, sore Aggravating factors: stairs, prolonged standing, lifting Relieving factors: none  PRECAUTIONS: None  RED FLAGS: None   WEIGHT BEARING RESTRICTIONS: No  FALLS:  Has patient fallen in last 6 months? No  LIVING ENVIRONMENT: Lives with: lives with their family Lives in: House/apartment Stairs:  ramp access Has following equipment at home: Quad cane small base  OCCUPATION: Not working  PLOF:  Independent with basic ADLs  PATIE4NT GOALS: pt wants to decrease pain and strength in order to walk farther without fear of falling  NEXT MD VISIT: 05/12/2023  OBJECTIVE:  Note: Objective measures were completed at Evaluation unless otherwise noted.  DIAGNOSTIC FINDINGS:   See imaging   PATIENT SURVEYS:  LEFS: 12/80  COGNITION: Overall cognitive status: Within functional limits for tasks assessed     SENSATION: Light touch: Impaired - L anterior LE  POSTURE: R knee valgus, rounded shoulders, forward head, increased lumbar lordosis, and large body habitus  PALPATION: TTP to R lateral knee joint line; knee pivot shift felt during knee ext MMT testing with reproduced pain  LOWER EXTREMITY ROM:  Active ROM Right eval Left eval  Hip flexion    Hip extension    Hip abduction    Hip adduction    Hip internal rotation    Hip external rotation    Knee flexion 110 120  Knee extension 0 0  Ankle dorsiflexion    Ankle plantarflexion    Ankle inversion    Ankle eversion     (Blank rows = not tested)  LOWER EXTREMITY MMT:  MMT Right eval Left eval Right 05/05/23 Left 05/05/23  Hip flexion 3+ 4 4 4   Hip extension      Hip abduction 3+ 3+ 4 4  Hip adduction      Hip internal rotation      Hip external rotation      Knee flexion 4 4 4 4   Knee extension 3 4 4 4   Ankle dorsiflexion      Ankle plantarflexion      Ankle inversion      Ankle eversion       (Blank rows = not tested)  LOWER EXTREMITY SPECIAL TESTS:  Knee special tests: Anterior drawer test: negative and Posterior drawer test: negative  FUNCTIONAL TESTS:  30 Second Sit to Stand: 4 reps with UE  GAIT: Distance walked: 76ft Assistive device utilized:  hand hold assist with daughter Level of assistance: SBA Comments: antalgic gait R, decreased gait speed  TREATMENT: [facility] Adult PT Treatment:                                                DATE: 05/05/2023 Therapeutic Exercise: Supine QS x 10 - 5"  hold Supine SLR 2x15 Supine clamshell 2x15 black band S/L hip abd 2x10 each Modified thomas stretch x 60" each Bridge 3x10 Therapeutic Activity: NuStep lvl 5 x 5 min LE only for functional activity tolerance STS 2x10 - no UE high table Assessment of tests/measures, goals, and outcomes for discharge Review of HEP  [facility] Adult PT Treatment:  DATE: 04/23/2023 Therapeutic Exercise: Supine QS x 10 - 5" hold Supine SLR 2x10 2# each SAQ 2x10 2# each Modified thomas stretch x 60" each Supine clamshell 2x15 black band Bridge 3x10 LAQ 3x10 2# Seated hamstring curl 2x10 black band Therapeutic Activity: NuStep lvl 4 x 4 min LE only for functional activity tolerance STS 2x10 - no UE high table  [facility] Adult PT Treatment:                                                DATE: 04/11/2023 Therapeutic Exercise: Supine QS x 5 - 5" hold Supine SLR x 5 Supine clamshell x 15 blue  PATIENT EDUCATION:  Education details: eval findings, LEFS, HEP, POC Person educated: Patient Education method: Explanation, Demonstration, and Handouts Education comprehension: verbalized understanding and returned demonstration  HOME EXERCISE PROGRAM: Access Code: [email or web address] URL: [email or web address] Date: 05/05/2023 Prepared by: Loral Roch  Exercises - Supine Quadricep Sets  - 1 x daily - 7 x weekly - 2 sets - 10 reps - 5 sec hold - Active Straight Leg Raise with Quad Set  - 1 x daily - 7 x weekly - 3 sets - 15 reps - Sidelying Hip Abduction  - 1 x daily - 7 x weekly - 2-3 sets - 10 reps - Hooklying Clamshell with Resistance  - 1 x daily - 7 x weekly - 3 sets - 10 reps - blue band hold - Modified Thomas Stretch  - 1 x daily - 7 x weekly - 2 reps - 60 sec hold - Supine Bridge  - 1 x daily - 7 x weekly - 3 sets - 10 reps - 3 sec hold - Sit to Stand  - 1 x daily - 7 x weekly - 3 sets - 10 reps  ASSESSMENT:  CLINICAL IMPRESSION: Pt was able to complete  all prescribed exercises and demonstrated knowledge of HEP with no adverse effect. Over the course of PT treatment she has progressed well, with demonstration of improved LE strength, functional mobility, and subjective decrease in pain. She should continue to improve with HEP compliance and is being discharged from skilled therapy services at this time.   EVAL: Patient is a 48 y.o. F who was seen today for physical therapy evaluation and treatment for chronic bilateral knee pain, R>L, as well as LBP. Physical findings are consistent with MD impression as pt demonstrates severe decrease in LE strength and functional mobility. LEFS score shows severe disability in performance of home ADLs and higher level community activities showing she is operating well below desired PLOF. Pt would benefit from skilled PT services working on improving strength and stability in order to decrease knee pain and improve function.    OBJECTIVE IMPAIRMENTS: Abnormal gait, decreased activity tolerance, decreased balance, decreased mobility, difficulty walking, decreased ROM, decreased strength, impaired sensation, obesity, and pain  ACTIVITY LIMITATIONS: carrying, lifting, sitting, standing, squatting, stairs, transfers, and locomotion level  PARTICIPATION LIMITATIONS: meal prep, cleaning, driving, shopping, community activity, occupation, and yard work  PERSONAL FACTORS: Fitness, Time since onset of injury/illness/exacerbation, and 3+ comorbidities: HTN, DM II, obesity  are also affecting patient's functional outcome.   REHAB POTENTIAL: Fair - chronicity of condition and co-morbidities may affect progress  CLINICAL DECISION MAKING: Evolving/moderate complexity  EVALUATION COMPLEXITY: Moderate   GOALS: Goals reviewed with patient? No  SHORT TERM GOALS: Target date: 05/02/2023  Pt will be compliant and knowledgeable with initial HEP for improved comfort and carryover Baseline: initial HEP given  Goal status:  MET  2.  Pt will self report bilateral knee pain no greater than 7/10 for improved comfort and functional ability Baseline: 10/10 at worst, R>L Goal status: MET   LONG TERM GOALS: Target date: 06/06/2023   Pt will improve LEFS to no less than 24/80 as proxy for functional improvement with home ADLs and higher level community activity Baseline: 12/80 05/05/2023: Not tested due to no in person interpreter  Goal status: NOT TESTED  2.  Pt will self report bilateral knee pain no greater than 3-4/10 for improved comfort and functional ability Baseline: 10/10 at worst, R>L Goal status: MET   3.  Pt will increase 30 Second Sit to Stand rep count to no less than 7 reps for improved balance, strength, and functional mobility Baseline: 4 reps with UE  05/05/2023: 9 reps no UE Goal status: MET   4.  Pt will improve LE MMT to no less than 4/5 for all tested motions for improved knee stability and decrease pain Baseline: see MMT chart Goal status: MET   PLAN:  PT FREQUENCY: 1x/week  PT DURATION: 8 weeks  PLANNED INTERVENTIONS: 97164- PT Re-evaluation, 97110-Therapeutic exercises, 97530- Therapeutic activity, [id number]- Neuromuscular re-education, 97535- Self Care, 09811- Manual therapy, [id number]- Gait training, B1478- Electrical stimulation (unattended), [id number]- Electrical stimulation (manual), 97016- Vasopneumatic device, Dry Needling, Cryotherapy, and Moist heat  PLAN FOR NEXT SESSION: assess HEP response, closed chain quad strengthening, proximal hip strengthening, patellar taping  Ivor Mars, PT 05/05/2023, 11:12 AM

## 2023-05-07 ENCOUNTER — Ambulatory Visit: Payer: Self-pay | Attending: Physician Assistant | Admitting: Physician Assistant

## 2023-05-07 ENCOUNTER — Other Ambulatory Visit: Payer: Self-pay

## 2023-05-07 VITALS — BP 115/75 | HR 95 | Temp 97.9°F | Ht 64.0 in | Wt 286.0 lb

## 2023-05-07 DIAGNOSIS — Z7985 Long-term (current) use of injectable non-insulin antidiabetic drugs: Secondary | ICD-10-CM

## 2023-05-07 DIAGNOSIS — H6592 Unspecified nonsuppurative otitis media, left ear: Secondary | ICD-10-CM

## 2023-05-07 DIAGNOSIS — E1165 Type 2 diabetes mellitus with hyperglycemia: Secondary | ICD-10-CM

## 2023-05-07 DIAGNOSIS — Z794 Long term (current) use of insulin: Secondary | ICD-10-CM

## 2023-05-07 LAB — POCT URINALYSIS DIP (CLINITEK)
Bilirubin, UA: NEGATIVE
Blood, UA: NEGATIVE
Glucose, UA: >=1000 mg/dL — AB
Ketones, POC UA: NEGATIVE mg/dL
Leukocytes, UA: NEGATIVE
Nitrite, UA: NEGATIVE
POC PROTEIN,UA: NEGATIVE
Spec Grav, UA: 1.01 (ref 1.010–1.025)
Urobilinogen, UA: 0.2 U/dL
pH, UA: 5.5 (ref 5.0–8.0)

## 2023-05-07 LAB — GLUCOSE, POCT (MANUAL RESULT ENTRY)
POC Glucose: 477 mg/dL — AB (ref 70–99)
POC Glucose: 461 mg/dL — AB (ref 70–99)

## 2023-05-07 MED ORDER — FLUCONAZOLE 150 MG PO TABS
150.0000 mg | ORAL_TABLET | Freq: Once | ORAL | 0 refills | Status: DC
Start: 2023-05-07 — End: 2023-05-11
  Filled 2023-05-07: qty 1, 1d supply, fill #0

## 2023-05-07 MED ORDER — ACETAMINOPHEN 325 MG PO TABS: 650.0000 mg | ORAL_TABLET | Freq: Once | ORAL | Status: AC

## 2023-05-07 MED ORDER — AMOXICILLIN 500 MG PO CAPS
500.0000 mg | ORAL_CAPSULE | Freq: Three times a day (TID) | ORAL | 0 refills | Status: DC
  Filled 2023-05-07: qty 30, 10d supply, fill #0

## 2023-05-07 MED ORDER — FLUTICASONE PROPIONATE 50 MCG/ACT NA SUSP
2.0000 | Freq: Every day | NASAL | 6 refills | Status: DC
  Filled 2023-05-07: qty 16, 30d supply, fill #0

## 2023-05-07 MED ORDER — INSULIN ASPART 100 UNIT/ML IJ SOLN
15.0000 [IU] | Freq: Once | INTRAMUSCULAR | Status: AC
  Administered 2023-05-07: 15 [IU] via SUBCUTANEOUS

## 2023-05-07 NOTE — Patient Instructions (Signed)
 Drink 64 to 80 ounces water daily   Otitis media en los adultos Otitis Media, Adult  La otitis media es la inflamacin y la acumulacin de lquido en el odo [place], que se manifiesta con signos y sntomas de una infeccin aguda. El odo medio es la parte del odo que contiene los huesos de la audicin, as Neurosurgeon aire que ayuda a Corporate treasurer los sonidos al cerebro. Cuando se acumula lquido infectado en este espacio, genera presin y puede provocar una infeccin en el odo. La trompa de [place] conecta el odo medio con la parte posterior de la nariz (nasofaringe) y, normalmente, permite que ingrese aire en el odo [place]. Si la trompa de [place] se obstruye, puede acumularse lquido e infectarse. Cules son las causas? Esta afeccin es consecuencia de una obstruccin en la trompa de [place]. La causa puede ser una mucosidad o la hinchazn de la trompa. Algunos de los problemas que pueden causar Ebb Goldman obstruccin son los siguientes: Resfriados u otra infeccin de las vas respiratorias superiores. Alergias. Un irritante, como el humo del tabaco. Adenoides agrandadas. Las adenoides son zonas de tejido blando ubicadas en la parte posterior de la garganta, detrs de la nariz y Advice worker. Wil[place]urt Hands parte del sistema de defensa del organismo (sistema inmunitario). Un bulto en la nasofaringe. Dao en el odo a causa de cambios de presin (barotraumatismo). Qu incrementa el riesgo? Es ms probable que tenga esta afeccin si: Fuma o se expone al humo de tabaco. Tiene una abertura en la parte superior de la boca (hendidura del paladar). Tiene reflujo gastroesofgico. Tiene un trastorno del sistema inmunitario. Cules son los signos o sntomas? Los sntomas de esta afeccin incluyen: Dolor de odo. Kayla Scott. Disminucin de la audicin. Cansancio (letargo). Supuracin de lquido por el odo, si el tmpano se rompe o revienta. Zumbidos en el odo. Cmo se diagnostica?  Esta afeccin se  diagnostica mediante un examen fsico. Durante el examen, con un instrumento llamado otoscopio, el mdico mirar dentro del odo para Counsellor, hinchazn y presencia de lquido. Tambin le preguntar acerca de sus sntomas. El mdico tambin puede indicarle [place], como: Una otoscopia neumtica. Es un estudio que se realiza para Loss adjuster, chartered movimiento del tmpano. Se realiza introduciendo una pequea cantidad de aire en el odo. Un timpanograma. Es un estudio que muestra si el tmpano se Walt Disney en respuesta a la presin de aire en el canal [place]. Genera un grfico para que el mdico lo evale. Cmo se trata? Esta afeccin puede desaparecer sin tratamiento en el transcurso de 3 a 5 das. Sin embargo, si la causa de la afeccin es una infeccin bacteriana y no desaparece sin tratamiento, o si vuelve a aparecer ms de una vez, el mdico podra hacer lo siguiente: Recetarle antibiticos para tratar la infeccin. Recetarle o recomendarle medicamentos para Human resources officer. Siga estas indicaciones en su casa: Use los medicamentos de venta libre y los recetados solamente como se lo haya indicado el mdico. Si le recetaron un antibitico, tmelo como se lo haya indicado el mdico. No deje de tomar el antibitico aunque comience a sentirse mejor. Concurra a todas las visitas de seguimiento. Esto es importante. Comunquese con un mdico si: Le sangra la nariz. Tiene un bulto en el cuello. No se siente mejor al cabo de 5 das. Empeora en lugar de mejorar. Solicite ayuda de inmediato si: Tiene dolor intenso que no puede controlar con medicamentos. Tiene hinchazn, enrojecimiento o dolor en el odo. Siente rigidez en el cuello. Una  parte de su rostro no se mueve (paralizada). Le duele el hueso que se encuentra detrs del odo (mastoides) al tocarlo. Desarrolla un dolor de cabeza intenso. Resumen La otitis media es el enrojecimiento, Chief Technology Officer y la hinchazn del odo medio, que  generalmente provoca dolor y disminucin de la audicin. Esta afeccin puede desaparecer sin tratamiento en el transcurso de 3 a 5 das. Si el problema no desaparece en el trmino de 3 a 5 das, el mdico puede recetarle medicamentos para tratar la infeccin. Si le recetaron un antibitico, tmelo como se lo haya indicado el mdico. Siga todas las instrucciones que le haya dado su mdico. Esta informacin no tiene Theme park manager el consejo del mdico. Asegrese de hacerle al mdico cualquier pregunta que tenga. Document Revised: 05/05/2020 Document Reviewed: 05/05/2020 Elsevier Patient Education  2024 [employer].

## 2023-05-07 NOTE — Progress Notes (Signed)
 Patient ID: Kayla Scott, female   DOB: 12-07-1975, 48 y.o.   MRN: [id number]   Kayla Scott, is a 48 y.o. female  [id number]  [id number]  DOB - [birth year]  Chief Complaint  Patient presents with   Ear Pain    L ear pain X3 days        Subjective:   Kayla Scott is a 48 y.o. female here today for L ear pain for 3 days that is not getting better.  No fever.  She took oral hypoglycemics today and is due ozempic  today.  Not taking anything for ear.  Recent A1C=11.1.  reports compliance with meds.     No problems updated.  ALLERGIES: No Known Allergies  PAST MEDICAL HISTORY: Past Medical History:  Diagnosis Date   Diabetes mellitus without complication (HCC) 01/14/2014   Elevated LDL cholesterol level    Gestational diabetes    Left-sided back pain 02/25/2022   Obesity    Right knee DJD 03/14/2020   Had injected with Dr. Isabella Mao office.  Reportedly recommended surgery, but could not afford.   S/P C-section 10/18/2014   UTI (lower urinary tract infection)     MEDICATIONS AT HOME: Prior to Admission medications   Medication Sig Start Date End Date Taking? Authorizing Provider  AgaMatrix Ultra-Thin Lancets MISC Check blood glucose twice daily before meals 02/25/22  Yes Ronalee Cocking, MD  amoxicillin  (AMOXIL ) 500 MG capsule Take 1 capsule (500 mg total) by mouth 3 (three) times daily for 10 days. 05/07/23 05/17/23 Yes Gwendola Hornaday, Stan Eans, PA-C  atorvastatin  (LIPITOR) 20 MG tablet Take 1 tablet (20 mg total) by mouth daily. 04/30/23  Yes Collins Dean, NP  Blood Glucose Monitoring Suppl (AGAMATRIX PRESTO) w/Device KIT Check blood glucose before meals twice daily 02/25/22  Yes Ronalee Cocking, MD  Blood Glucose Monitoring Suppl (TRUE METRIX METER) w/Device KIT Use as instructed. Check blood glucose level by fingerstick twice per day.  E11.65 12/06/22  Yes Fleming, [name] W, NP  fluconazole  (DIFLUCAN ) 150 MG tablet For 1 week, take  150 mg (1 tablet) PO every 72 hours for 3 doses (day 1, 4, and 7). Then take 1 tablet (150 mg) once a week for 3 months until gone. 04/30/23  Yes Fleming, [name] W, NP  fluconazole  (DIFLUCAN ) 150 MG tablet Take 1 tablet (150 mg total) by mouth once for 1 dose. After you complete your antibiotics 05/07/23 05/07/23 Yes Ara Grandmaison M, PA-C  glipiZIDE  (GLUCOTROL ) 10 MG tablet Take 1 tablet (10 mg total) by mouth 2 (two) times daily before a meal. 04/30/23  Yes Fleming, [name] W, NP  glucose blood (AGAMATRIX PRESTO TEST) test strip Check blood glucose before meals twice daily 02/25/22  Yes Ronalee Cocking, MD  glucose blood (TRUE METRIX BLOOD GLUCOSE TEST) test strip Use as instructed. Check blood glucose level by fingerstick twice per day.  E11.65 12/06/22  Yes Fleming, [name] W, NP  lidocaine  (LIDODERM ) 5 % Place 1 patch onto the skin daily. Remove & Discard patch within 12 hours or as directed by MD 07/04/22  Yes Smoot, Genevive Ket, PA-C  lisinopril  (ZESTRIL ) 10 MG tablet Take 1 tablet (10 mg total) by mouth daily. 04/30/23  Yes Fleming, [name] W, NP  loratadine  (CLARITIN ) 10 MG tablet Take 1 tablet (10 mg total) by mouth daily. 04/30/23  Yes Fleming, [name] W, NP  meloxicam  (MOBIC ) 15 MG tablet Take 1 tablet (15 mg total) by mouth daily with a meal for 2 weeks, then once every 24  hours prn pain. 03/31/23  Yes Gean Keels, MD  metFORMIN  (GLUCOPHAGE -XR) 500 MG 24 hr tablet Take 2 tablets (1,000 mg total) by mouth 2 (two) times daily with a meal. 04/30/23  Yes Fleming, [name] W, NP  methocarbamol  (ROBAXIN ) 750 MG tablet Take 1 tablet (750 mg total) by mouth every 8 (eight) hours as needed for muscle spasms. 10/10/22  Yes Collins Dean, NP  Semaglutide , 1 MG/DOSE, 4 MG/3ML SOPN Inject 1 mg as directed once a week. 04/30/23  Yes Fleming, [name] W, NP  triamcinolone  cream (KENALOG ) 0.1 % Apply twice daily to affected area 04/10/22  Yes Ronalee Cocking, MD  TRUEplus Lancets 28G MISC Use as instructed. Check  blood glucose level by fingerstick twice per day.  E11.65 12/06/22  Yes Collins Dean, NP    ROS: Neg HEENT Neg resp Neg cardiac Neg GI Neg GU Neg MS Neg psych Neg neuro  Objective:   Vitals:   05/07/23 1336  BP: 115/75  Pulse: 95  Temp: 97.9 F (36.6 C)  TempSrc: Oral  SpO2: 96%  Weight: 286 lb (129.7 kg)  Height: 5\' 4"  (1.626 m)   Exam General appearance : Awake, alert, not in any distress. Speech Clear. Not toxic looking HEENT: Atraumatic and Normocephalic.  R TM WNL.  L TM bulging and TM erythematous.   Neck: Supple, no JVD. No cervical lymphadenopathy.  Chest: Good air entry bilaterally, CTAB.  No rales/rhonchi/wheezing CVS: S1 S2 regular, no murmurs.  Extremities: B/L Lower Ext shows no edema, both legs are warm to touch Neurology: Awake alert, and oriented X 3, CN II-XII intact, Non focal Skin: No Rash  Data Review Lab Results  Component Value Date   HGBA1C 11.1 (A) 04/30/2023   HGBA1C 10.9 (A) 01/23/2023   HGBA1C 8.4 (H) 09/26/2022    Assessment & Plan   1. Type 2 diabetes mellitus with hyperglycemia, unspecified whether long term insulin  use (HCC) (Primary) Uncontrolled.  Gave 24 ounces water in office Take ozempic  when you get home - Glucose (CBG) - insulin  aspart (novoLOG ) injection 15 Units - POCT URINALYSIS DIP (CLINITEK)  2. Left non-suppurative otitis media -650mg  tylenol  now - amoxicillin  (AMOXIL ) 500 MG capsule; Take 1 capsule (500 mg total) by mouth 3 (three) times daily for 10 days.  Dispense: 30 capsule; Refill: 0 - fluconazole  (DIFLUCAN ) 150 MG tablet; Take 1 tablet (150 mg total) by mouth once for 1 dose. After you complete your antibiotics  Dispense: 1 tablet; Refill: 0    Return for keep appt in May with Van Gelinas and July with [name].  The patient was given clear instructions to go to ER or return to medical center if symptoms don't improve, worsen or new problems develop. The patient verbalized understanding. The patient was told to  call to get lab results if they haven't heard anything in the next week.      Dulce Gibbs, PA-C [facility] [place], [place] [phone]   05/07/2023, 1:58 PM

## 2023-05-08 ENCOUNTER — Encounter (HOSPITAL_COMMUNITY): Payer: Self-pay

## 2023-05-08 ENCOUNTER — Emergency Department (HOSPITAL_COMMUNITY): Payer: Self-pay

## 2023-05-08 ENCOUNTER — Other Ambulatory Visit: Payer: Self-pay

## 2023-05-08 ENCOUNTER — Inpatient Hospital Stay (HOSPITAL_COMMUNITY)
Admission: EM | Admit: 2023-05-08 | Discharge: 2023-05-11 | DRG: 153 | Disposition: A | Discharge status: Home / Self Care | Payer: Self-pay | Attending: Family Medicine | Admitting: Family Medicine

## 2023-05-08 DIAGNOSIS — Z8632 Personal history of gestational diabetes: Secondary | ICD-10-CM

## 2023-05-08 DIAGNOSIS — Z87440 Personal history of urinary (tract) infections: Secondary | ICD-10-CM

## 2023-05-08 DIAGNOSIS — Z833 Family history of diabetes mellitus: Secondary | ICD-10-CM

## 2023-05-08 DIAGNOSIS — Z98891 History of uterine scar from previous surgery: Secondary | ICD-10-CM

## 2023-05-08 DIAGNOSIS — H7092 Unspecified mastoiditis, left ear: Secondary | ICD-10-CM | POA: Diagnosis present

## 2023-05-08 DIAGNOSIS — H66002 Acute suppurative otitis media without spontaneous rupture of ear drum, left ear: Principal | ICD-10-CM | POA: Diagnosis present

## 2023-05-08 DIAGNOSIS — H6690 Otitis media, unspecified, unspecified ear: Secondary | ICD-10-CM | POA: Diagnosis present

## 2023-05-08 DIAGNOSIS — E66813 Obesity, class 3: Secondary | ICD-10-CM | POA: Diagnosis present

## 2023-05-08 DIAGNOSIS — Z8379 Family history of other diseases of the digestive system: Secondary | ICD-10-CM

## 2023-05-08 DIAGNOSIS — Z9851 Tubal ligation status: Secondary | ICD-10-CM

## 2023-05-08 DIAGNOSIS — M1711 Unilateral primary osteoarthritis, right knee: Secondary | ICD-10-CM | POA: Diagnosis present

## 2023-05-08 DIAGNOSIS — E876 Hypokalemia: Secondary | ICD-10-CM | POA: Diagnosis present

## 2023-05-08 DIAGNOSIS — E1165 Type 2 diabetes mellitus with hyperglycemia: Secondary | ICD-10-CM | POA: Diagnosis present

## 2023-05-08 DIAGNOSIS — N761 Subacute and chronic vaginitis: Secondary | ICD-10-CM

## 2023-05-08 DIAGNOSIS — D72829 Elevated white blood cell count, unspecified: Secondary | ICD-10-CM | POA: Diagnosis present

## 2023-05-08 DIAGNOSIS — E785 Hyperlipidemia, unspecified: Secondary | ICD-10-CM | POA: Diagnosis present

## 2023-05-08 DIAGNOSIS — Z6841 Body Mass Index (BMI) 40.0 and over, adult: Secondary | ICD-10-CM

## 2023-05-08 DIAGNOSIS — H6592 Unspecified nonsuppurative otitis media, left ear: Secondary | ICD-10-CM

## 2023-05-08 DIAGNOSIS — R739 Hyperglycemia, unspecified: Secondary | ICD-10-CM

## 2023-05-08 DIAGNOSIS — H6092 Unspecified otitis externa, left ear: Secondary | ICD-10-CM | POA: Diagnosis present

## 2023-05-08 DIAGNOSIS — Z808 Family history of malignant neoplasm of other organs or systems: Secondary | ICD-10-CM

## 2023-05-08 DIAGNOSIS — H7090 Unspecified mastoiditis, unspecified ear: Secondary | ICD-10-CM | POA: Diagnosis present

## 2023-05-08 DIAGNOSIS — E119 Type 2 diabetes mellitus without complications: Secondary | ICD-10-CM

## 2023-05-08 DIAGNOSIS — Z79899 Other long term (current) drug therapy: Secondary | ICD-10-CM

## 2023-05-08 DIAGNOSIS — J3089 Other allergic rhinitis: Secondary | ICD-10-CM

## 2023-05-08 DIAGNOSIS — Z7984 Long term (current) use of oral hypoglycemic drugs: Secondary | ICD-10-CM

## 2023-05-08 DIAGNOSIS — Z791 Long term (current) use of non-steroidal anti-inflammatories (NSAID): Secondary | ICD-10-CM

## 2023-05-08 DIAGNOSIS — Z7985 Long-term (current) use of injectable non-insulin antidiabetic drugs: Secondary | ICD-10-CM

## 2023-05-08 DIAGNOSIS — I10 Essential (primary) hypertension: Secondary | ICD-10-CM | POA: Diagnosis present

## 2023-05-08 LAB — BASIC METABOLIC PANEL WITH GFR
Anion gap: 13 (ref 5–15)
BUN: 13 mg/dL (ref 6–20)
CO2: 21 mmol/L — ABNORMAL LOW (ref 22–32)
Calcium: 9.2 mg/dL (ref 8.9–10.3)
Chloride: 102 mmol/L (ref 98–111)
Creatinine, Ser: 0.6 mg/dL (ref 0.44–1.00)
GFR, Estimated: >60 mL/min (ref >60)
Glucose, Bld: 319 mg/dL — ABNORMAL HIGH (ref 70–99)
Potassium: 3.7 mmol/L (ref 3.5–5.1)
Sodium: 136 mmol/L (ref 135–145)

## 2023-05-08 LAB — CBC WITH DIFFERENTIAL/PLATELET
Abs Immature Granulocytes: 0.03 10*3/uL (ref 0.00–0.07)
Basophils Absolute: 0 10*3/uL (ref 0.0–0.1)
Basophils Relative: 0 %
Eosinophils Absolute: 0.1 10*3/uL (ref 0.0–0.5)
Eosinophils Relative: 1 %
HCT: 40.4 % (ref 36.0–46.0)
Hemoglobin: 13.7 g/dL (ref 12.0–15.0)
Immature Granulocytes: 0 %
Lymphocytes Relative: 21 %
Lymphs Abs: 2.3 10*3/uL (ref 0.7–4.0)
MCH: 32.5 pg (ref 26.0–34.0)
MCHC: 33.9 g/dL (ref 30.0–36.0)
MCV: 96 fL (ref 80.0–100.0)
Monocytes Absolute: 0.9 10*3/uL (ref 0.1–1.0)
Monocytes Relative: 8 %
Neutro Abs: 7.6 10*3/uL (ref 1.7–7.7)
Neutrophils Relative %: 70 %
Platelets: 270 10*3/uL (ref 150–400)
RBC: 4.21 MIL/uL (ref 3.87–5.11)
RDW: 12.8 % (ref 11.5–15.5)
WBC: 10.8 10*3/uL — ABNORMAL HIGH (ref 4.0–10.5)
nRBC: 0 % (ref 0.0–0.2)

## 2023-05-08 LAB — CBC
HCT: 36.8 % (ref 36.0–46.0)
Hemoglobin: 12.4 g/dL (ref 12.0–15.0)
MCH: 32.6 pg (ref 26.0–34.0)
MCHC: 33.7 g/dL (ref 30.0–36.0)
MCV: 96.8 fL (ref 80.0–100.0)
Platelets: 246 10*3/uL (ref 150–400)
RBC: 3.8 MIL/uL — ABNORMAL LOW (ref 3.87–5.11)
RDW: 13.2 % (ref 11.5–15.5)
WBC: 11.2 10*3/uL — ABNORMAL HIGH (ref 4.0–10.5)
nRBC: 0 % (ref 0.0–0.2)

## 2023-05-08 LAB — GLUCOSE, CAPILLARY: Glucose-Capillary: 280 mg/dL — ABNORMAL HIGH (ref 70–99)

## 2023-05-08 LAB — CBG MONITORING, ED: Glucose-Capillary: 311 mg/dL — ABNORMAL HIGH (ref 70–99)

## 2023-05-08 MED ORDER — KETOROLAC TROMETHAMINE 30 MG/ML IJ SOLN
15.0000 mg | Freq: Once | INTRAMUSCULAR | Status: AC
  Administered 2023-05-08: 15 mg via INTRAVENOUS
  Filled 2023-05-08: qty 1

## 2023-05-08 MED ORDER — OXYCODONE HCL 5 MG PO TABS
5.0000 mg | ORAL_TABLET | Freq: Four times a day (QID) | ORAL | Status: DC | PRN
  Administered 2023-05-08 – 2023-05-11 (×7): 10 mg via ORAL
  Filled 2023-05-08: qty 1
  Filled 2023-05-08 (×2): qty 2
  Filled 2023-05-08: qty 1
  Filled 2023-05-08 (×4): qty 2

## 2023-05-08 MED ORDER — ENOXAPARIN SODIUM 40 MG/0.4ML IJ SOSY
40.0000 mg | PREFILLED_SYRINGE | INTRAMUSCULAR | Status: DC
Start: 2023-05-08 — End: 2023-05-08

## 2023-05-08 MED ORDER — SODIUM CHLORIDE 0.9 % IV SOLN
3.0000 g | Freq: Four times a day (QID) | INTRAVENOUS | Status: DC
  Administered 2023-05-08 – 2023-05-09 (×3): 3 g via INTRAVENOUS
  Filled 2023-05-08 (×4): qty 8

## 2023-05-08 MED ORDER — ONDANSETRON HCL 4 MG PO TABS: 4.0000 mg | ORAL_TABLET | Freq: Four times a day (QID) | ORAL | Status: DC | PRN

## 2023-05-08 MED ORDER — ONDANSETRON HCL 4 MG/2ML IJ SOLN
INTRAMUSCULAR | Status: AC
  Administered 2023-05-08: 4 mg via INTRAVENOUS
  Filled 2023-05-08: qty 2

## 2023-05-08 MED ORDER — INSULIN ASPART 100 UNIT/ML IJ SOLN
5.0000 [IU] | Freq: Once | INTRAMUSCULAR | Status: AC
  Administered 2023-05-08: 5 [IU] via SUBCUTANEOUS

## 2023-05-08 MED ORDER — ONDANSETRON HCL 4 MG/2ML IJ SOLN
4.0000 mg | Freq: Four times a day (QID) | INTRAMUSCULAR | Status: DC | PRN
  Administered 2023-05-09: 4 mg via INTRAVENOUS
  Filled 2023-05-08: qty 2

## 2023-05-08 MED ORDER — BISACODYL 5 MG PO TBEC: 5.0000 mg | DELAYED_RELEASE_TABLET | Freq: Every day | ORAL | Status: DC | PRN

## 2023-05-08 MED ORDER — LACTATED RINGERS IV BOLUS
1000.0000 mL | Freq: Once | INTRAVENOUS | Status: AC
  Administered 2023-05-08: 1000 mL via INTRAVENOUS

## 2023-05-08 MED ORDER — ATORVASTATIN CALCIUM 10 MG PO TABS
20.0000 mg | ORAL_TABLET | Freq: Every day | ORAL | Status: DC
  Administered 2023-05-08 – 2023-05-11 (×4): 20 mg via ORAL
  Filled 2023-05-08 (×5): qty 2

## 2023-05-08 MED ORDER — INSULIN ASPART 100 UNIT/ML IJ SOLN
0.0000 [IU] | Freq: Every day | INTRAMUSCULAR | Status: DC
  Administered 2023-05-08 – 2023-05-09 (×2): 3 [IU] via SUBCUTANEOUS
  Administered 2023-05-10: 2 [IU] via SUBCUTANEOUS

## 2023-05-08 MED ORDER — INSULIN ASPART 100 UNIT/ML IJ SOLN
0.0000 [IU] | Freq: Three times a day (TID) | INTRAMUSCULAR | Status: DC
  Administered 2023-05-09 (×2): 5 [IU] via SUBCUTANEOUS
  Administered 2023-05-09: 3 [IU] via SUBCUTANEOUS
  Administered 2023-05-10 (×3): 5 [IU] via SUBCUTANEOUS
  Administered 2023-05-11: 3 [IU] via SUBCUTANEOUS

## 2023-05-08 MED ORDER — VANCOMYCIN HCL 1750 MG/350ML IV SOLN
1750.0000 mg | Freq: Once | INTRAVENOUS | Status: DC
  Filled 2023-05-08: qty 350

## 2023-05-08 MED ORDER — ONDANSETRON HCL 4 MG/2ML IJ SOLN: 4.0000 mg | Freq: Once | INTRAMUSCULAR | Status: AC

## 2023-05-08 MED ORDER — IOHEXOL 350 MG/ML SOLN
75.0000 mL | Freq: Once | INTRAVENOUS | Status: AC | PRN
  Administered 2023-05-08: 75 mL via INTRAVENOUS

## 2023-05-08 MED ORDER — MORPHINE SULFATE (PF) 4 MG/ML IV SOLN
4.0000 mg | Freq: Once | INTRAVENOUS | Status: AC
  Administered 2023-05-08: 4 mg via INTRAVENOUS
  Filled 2023-05-08: qty 1

## 2023-05-08 MED ORDER — SODIUM CHLORIDE 0.9 % IV SOLN
2.0000 g | Freq: Once | INTRAVENOUS | Status: AC
  Administered 2023-05-08: 2 g via INTRAVENOUS
  Filled 2023-05-08: qty 20

## 2023-05-08 MED ORDER — ENOXAPARIN SODIUM 120 MG/0.8ML IJ SOSY
115.0000 mg | PREFILLED_SYRINGE | INTRAMUSCULAR | Status: DC
  Administered 2023-05-08: 115 mg via SUBCUTANEOUS
  Filled 2023-05-08 (×2): qty 0.76

## 2023-05-08 NOTE — Inpatient Diabetes Management (Signed)
 Inpatient Diabetes Program Recommendations  AACE/ADA: New Consensus Statement on Inpatient Glycemic Control (2015)  Target Ranges:  Prepandial:   less than 140 mg/dL      Peak postprandial:   less than 180 mg/dL (1-2 hours)      Critically ill patients:  140 - 180 mg/dL   Lab Results  Component Value Date   GLUCAP 76 10/18/2014   HGBA1C 11.1 (A) 04/30/2023    Review of Glycemic Control  Diabetes history: DM 2 Outpatient Diabetes medications: Ozempic  0.5 mg weekly, Glipizide  10 mg bid, metformin  1000 mg bid  A1c 11.1% increased from 10.9% since starting ozempic  and had dose titration on 4/16 Current orders for Inpatient glycemic control:  None, in waiting area of ED  PCP visit yesterday had to received Novolog  15 units in office. Recommend pt will need insulin  at discharge until Ozempic  medication has shown reduction in weight and glucose trends. Pt has close follow [facility] at [facility]. Will see when in room.   Please check CBGs w/ v/s in waiting area. Pt already familiar with injecting via pen as Ozempic  is administered in pen form.   Thanks,  Eloise Hake RN, MSN, BC-ADM Inpatient Diabetes Coordinator Team Pager [phone] (8a-5p)

## 2023-05-08 NOTE — ED Provider Notes (Signed)
 [place] EMERGENCY DEPARTMENT AT [facility] Provider Note   CSN: [id number] Arrival date & time: 05/08/23  0865     History  Chief Complaint  Patient presents with   Otalgia    Kayla Scott is a 48 y.o. female.  Patient is a 48 year old female with a past medical history of hypertension, hyperlipidemia and diabetes presenting to the emergency department with left ear pain.  Patient states that she started to develop left ear pain on Tuesday.  She states that the pain has been worsening throughout the week.  She states that she feels like she has decreased hearing in this ear.  She states that she has been having fevers at home but has not measured her temperature and has been taking Tylenol  or Motrin  for fever and pain.  She denies any associated congestion, sore throat, nausea, vomiting or diarrhea.  She was seen by her primary doctor yesterday and was started on amoxicillin  but states that her symptoms have only gotten worse since then.  The history is provided by the patient and a relative. No language interpreter was used (Patient declined, requested family to translate).  Otalgia      Home Medications Prior to Admission medications   Medication Sig Start Date End Date Taking? Authorizing Provider  AgaMatrix Ultra-Thin Lancets MISC Check blood glucose twice daily before meals 02/25/22   Ronalee Cocking, MD  amoxicillin  (AMOXIL ) 500 MG capsule Take 1 capsule (500 mg total) by mouth 3 (three) times daily for 10 days. 05/07/23 05/17/23  Hassie Lint, PA-C  atorvastatin  (LIPITOR) 20 MG tablet Take 1 tablet (20 mg total) by mouth daily. 04/30/23   Fleming, [name] W, NP  Blood Glucose Monitoring Suppl (AGAMATRIX PRESTO) w/Device KIT Check blood glucose before meals twice daily 02/25/22   Ronalee Cocking, MD  Blood Glucose Monitoring Suppl (TRUE METRIX METER) w/Device KIT Use as instructed. Check blood glucose level by fingerstick twice per day.  E11.65  12/06/22   Fleming, [name] W, NP  fluconazole  (DIFLUCAN ) 150 MG tablet For 1 week, take 150 mg (1 tablet) PO every 72 hours for 3 doses (day 1, 4, and 7). Then take 1 tablet (150 mg) once a week for 3 months until gone. 04/30/23   Fleming, [name] W, NP  fluconazole  (DIFLUCAN ) 150 MG tablet Take 1 tablet (150 mg total) by mouth once for 1 dose. After you complete your antibiotics 05/07/23 05/08/23  Hassie Lint, PA-C  fluticasone  (FLONASE ) 50 MCG/ACT nasal spray Place 2 sprays into both nostrils daily. 05/07/23   Hassie Lint, PA-C  glipiZIDE  (GLUCOTROL ) 10 MG tablet Take 1 tablet (10 mg total) by mouth 2 (two) times daily before a meal. 04/30/23   Collins Dean, NP  glucose blood (AGAMATRIX PRESTO TEST) test strip Check blood glucose before meals twice daily 02/25/22   Ronalee Cocking, MD  glucose blood (TRUE METRIX BLOOD GLUCOSE TEST) test strip Use as instructed. Check blood glucose level by fingerstick twice per day.  E11.65 12/06/22   Fleming, [name] W, NP  lidocaine  (LIDODERM ) 5 % Place 1 patch onto the skin daily. Remove & Discard patch within 12 hours or as directed by MD 07/04/22   Smoot, Genevive Ket, PA-C  lisinopril  (ZESTRIL ) 10 MG tablet Take 1 tablet (10 mg total) by mouth daily. 04/30/23   Fleming, [name] W, NP  loratadine  (CLARITIN ) 10 MG tablet Take 1 tablet (10 mg total) by mouth daily. 04/30/23   Fleming, [name] W, NP  meloxicam  (MOBIC )  15 MG tablet Take 1 tablet (15 mg total) by mouth daily with a meal for 2 weeks, then once every 24 hours prn pain. 03/31/23   Gean Keels, MD  metFORMIN  (GLUCOPHAGE -XR) 500 MG 24 hr tablet Take 2 tablets (1,000 mg total) by mouth 2 (two) times daily with a meal. 04/30/23   Fleming, [name] W, NP  methocarbamol  (ROBAXIN ) 750 MG tablet Take 1 tablet (750 mg total) by mouth every 8 (eight) hours as needed for muscle spasms. 10/10/22   Collins Dean, NP  Semaglutide , 1 MG/DOSE, 4 MG/3ML SOPN Inject 1 mg as directed once a week. 04/30/23   Fleming,  [name] W, NP  triamcinolone  cream (KENALOG ) 0.1 % Apply twice daily to affected area 04/10/22   Ronalee Cocking, MD  TRUEplus Lancets 28G MISC Use as instructed. Check blood glucose level by fingerstick twice per day.  E11.65 12/06/22   Collins Dean, NP      Allergies    Patient has no known allergies.    Review of Systems   Review of Systems  HENT:  Positive for ear pain.     Physical Exam Updated Vital Signs BP 110/70   Pulse (!) 117   Temp 98.6 F (37 C) (Oral)   Resp 20   Ht 5\' 4"  (1.626 m)   Wt 129.7 kg   LMP 05/02/2020   SpO2 98%   BMI 49.09 kg/m  Physical Exam Vitals and nursing note reviewed.  Constitutional:      General: She is not in acute distress.    Appearance: Normal appearance. She is obese.  HENT:     Head: Normocephalic and atraumatic.     Right Ear: Tympanic membrane, ear canal and external ear normal.     Left Ear: Tenderness (along mastoid bone posterior to L ear, no appreciable swelling) present. A middle ear effusion is present. Tympanic membrane is bulging.     Nose: Nose normal.     Mouth/Throat:     Mouth: Mucous membranes are moist.     Pharynx: Oropharynx is clear.  Eyes:     Extraocular Movements: Extraocular movements intact.     Conjunctiva/sclera: Conjunctivae normal.  Cardiovascular:     Rate and Rhythm: Regular rhythm. Tachycardia present.     Heart sounds: Normal heart sounds.  Pulmonary:     Effort: Pulmonary effort is normal.     Breath sounds: Normal breath sounds.  Abdominal:     General: Abdomen is flat.     Palpations: Abdomen is soft.     Tenderness: There is no abdominal tenderness.  Musculoskeletal:        General: Normal range of motion.     Cervical back: Normal range of motion.  Lymphadenopathy:     Cervical: Cervical adenopathy (L-sided) present.  Skin:    General: Skin is warm and dry.  Neurological:     General: No focal deficit present.     Mental Status: She is alert and oriented to person, place,  and time.  Psychiatric:        Mood and Affect: Mood normal.        Behavior: Behavior normal.     ED Results / Procedures / Treatments   Labs (all labs ordered are listed, but only abnormal results are displayed) Labs Reviewed  BASIC METABOLIC PANEL WITH GFR - Abnormal; Notable for the following components:      Result Value   CO2 21 (*)    Glucose, Bld 319 (*)  All other components within normal limits  CBC WITH DIFFERENTIAL/PLATELET - Abnormal; Notable for the following components:   WBC 10.8 (*)    All other components within normal limits  CBG MONITORING, ED    EKG None  Radiology CT Temporal Bones W Contrast Result Date: 05/08/2023 CLINICAL DATA:  Otitis media, ear pain and left facial pain. EXAM: CT TEMPORAL BONES WITH CONTRAST TECHNIQUE: Axial and coronal plane CT imaging of the petrous temporal bones was performed with thin-collimation image reconstruction following intravenous contrast administration. Multiplanar CT image reconstructions were also generated. RADIATION DOSE REDUCTION: This exam was performed according to the departmental dose-optimization program which includes automated exposure control, adjustment of the mA and/or kV according to patient size and/or use of iterative reconstruction technique. CONTRAST:  75mL OMNIPAQUE  IOHEXOL  350 MG/ML SOLN COMPARISON:  CT head 04/10/2004. FINDINGS: RIGHT TEMPORAL BONE External auditory canal: Normal. Middle ear cavity: Normally aerated. The scutum and ossicles are normal. The tegmen tympani is intact. Inner ear structures: The cochlea, vestibule and semicircular canals are normal. The vestibular aqueduct is not enlarged. Vestibular aqueduct also appears to communicate with the prominent right jugular bulb. Internal auditory and facial nerve canals:  Normal Mastoid air cells: The visualized mastoid air cells are normally aerated. Limited visualization of the right mastoid tip. No osseous erosion. LEFT TEMPORAL BONE External  auditory canal: There is abnormal soft tissue extending into the osseous portion of the external auditory canal without evidence of nodular or masslike soft tissue. No osseous erosion. External auditory canal is otherwise unremarkable. Middle ear cavity: Abnormal soft tissue noted involving and nearly completely opacifying the hypo tympanum. Abnormal soft tissue also noted in the mesotympanum and epitympanum surrounding the ossicles. The ossicles are normal in morphology without evidence of erosive change. The scutum is intact. Tegmen tympani is intact. Inner ear structures: The cochlea, vestibule and semicircular canals are normal. The vestibular aqueduct is not enlarged. Internal auditory and facial nerve canals: Osseous prominence at the porous acusticus resulting in mild narrowing of the medial internal auditory canal. The internal auditory canals otherwise unremarkable. Mastoid air cells: Moderate mastoid effusion most pronounced in the mastoid tip. No erosive change. Vascular: The carotid canals are unremarkable. Right jugular bulb is mildly prominent with thinning of the overlying sigmoid plate without dehiscence. Limited intracranial:  No acute or significant finding. Visible orbits/paranasal sinuses: No acute or significant finding in the orbits. Near complete opacification of the left maxillary sinus. Additional moderate mucosal thickening in the left ethmoid sinus and left sphenoid sinus with possible air-fluid level in the left sphenoid sinus. Soft tissues: Normal. IMPRESSION: Left tympanomastoid effusion suggestive of mastoiditis and otitis media. No findings to suggest coalescent mastoiditis. Fluid within the left middle ear cavity limits evaluation for small mass lesion. There is no erosive change of the ossicles. Thickening of the left tympanic membrane. Abnormal soft tissue involving the left external auditory canal which may be related to chronic inflammation. No masslike soft tissue or osseous  erosion. Mild osseous narrowing of the medial left internal auditory canal. Prominent right jugular bulb with thinning of the overlying sigmoid plate without evidence of dehiscence. Paranasal sinus disease as above. Possible air-fluid level in the left sphenoid sinus. Recommend correlation acute sinusitis. Electronically Signed   By: Denny Flack M.D.   On: 05/08/2023 12:52    Procedures Procedures    Medications Ordered in ED Medications  insulin  aspart (novoLOG ) injection 5 Units (has no administration in time range)  cefTRIAXone  (ROCEPHIN ) 2 g in sodium  chloride 0.9 % 100 mL IVPB (2 g Intravenous New Bag/Given 05/08/23 1638)  vancomycin  (VANCOREADY) IVPB 1750 mg/350 mL (has no administration in time range)  ketorolac  (TORADOL ) 30 MG/ML injection 15 mg (15 mg Intravenous Given 05/08/23 0845)  iohexol  (OMNIPAQUE ) 350 MG/ML injection 75 mL (75 mLs Intravenous Contrast Given 05/08/23 1109)  morphine  (PF) 4 MG/ML injection 4 mg (4 mg Intravenous Given 05/08/23 1635)  lactated ringers  bolus 1,000 mL (1,000 mLs Intravenous New Bag/Given 05/08/23 1637)    ED Course/ Medical Decision Making/ A&P Clinical Course as of 05/08/23 1643  Thu May 08, 2023  1559 I spoke with Dr. Virgia Griffins with ENT who agreed  [VK]    Clinical Course User Index [VK] Kingsley, Teren Zurcher K, DO                                 Medical Decision Making This patient presents to the ED with chief complaint(s) of left ear pain with pertinent past medical history of diabetes, hypertension, hyperlipidemia, recent diagnosis of otitis media which further complicates the presenting complaint. The complaint involves an extensive differential diagnosis and also carries with it a high risk of complications and morbidity.    The differential diagnosis includes otitis media, otitis externa, mastoiditis, sepsis, hyperglycemic crisis  Additional history obtained: Additional history obtained from family Records reviewed Primary Care  Documents  ED Course and Reassessment: On patient's arrival she is mildly tachycardic and otherwise hemodynamically stable in no acute distress.  Was initially evaluated by provider in triage and had labs and CT temporal bones performed.  Labs showed hyperglycemia without DKA, mild leukocytosis.  CT temporal bones showed concern with otitis media with mastoiditis.  Plan will be to discuss with ENT for management.  Will be given additional pain control and likely plan for IV antibiotics.  Independent labs interpretation:  The following labs were independently interpreted: mild leukocytosis, hyperglycemia without DKA  Independent visualization of imaging: - I independently visualized the following imaging with scope of interpretation limited to determining acute life threatening conditions related to emergency care: CT temporal bones, which revealed L otitis media with mastoiditis   Consultation: - Consulted or discussed management/test interpretation w/ external professional: ENT, hospitalist  Consideration for admission or further workup: patient requires admission for IV antibiotics and pain control  Social Determinants of health: N/A    Amount and/or Complexity of Data Reviewed Labs: ordered. Radiology: ordered.  Risk Prescription drug management.          Final Clinical Impression(s) / ED Diagnoses Final diagnoses:  Acute suppurative otitis media of left ear without spontaneous rupture of tympanic membrane, recurrence not specified  Mastoiditis of left side  Hyperglycemia    Rx / DC Orders ED Discharge Orders     None         Kingsley, Seher Schlagel K, DO 05/08/23 1643

## 2023-05-08 NOTE — ED Provider Triage Note (Signed)
 Emergency Medicine Provider Triage Evaluation Note  Kayla Scott , a 48 y.o. female  was evaluated in triage.  Pt complains of left ear pain..  Review of Systems  Positive: Chills Negative: Trauma  Physical Exam  BP (!) 130/91 (BP Location: Left Arm)   Pulse (!) 103   Temp 98.2 F (36.8 C) (Oral)   Resp 20   Ht 5\' 4"  (1.626 m)   Wt 129.7 kg   LMP 05/02/2020   SpO2 100%   BMI 49.09 kg/m  Left TM bulging and erythematous.  Does have tenderness over mastoid process.  Medical Decision Making  Medically screening exam initiated at 8:34 AM.  Appropriate orders placed.  Albirda Colen Daunt was informed that the remainder of the evaluation will be completed by another provider, this initial triage assessment does not replace that evaluation, and the importance of remaining in the ED until their evaluation is complete.  Patient diabetic with left ear pain.  Has been on penicillin but only briefly.  Is diabetic this had chills.  Does have worry for more extensive injury with tenderness over mastoid.  Will get blood work and CT scan.   Mozell Arias, MD 05/08/23 [id number]

## 2023-05-08 NOTE — Progress Notes (Signed)
 New Admission Note:    Arrival Method: ED stretcher Mental Orientation: alert and oriented x4 Telemetry: n/a Assessment:completed Skin: intact IV: right AC Pain:7/10 Left ear Tubes: n/a Safety Measures: nonskid socks Admission: completed Orientation: Patient has been oriented to the room, unit and staff.  Family: son Belongings: at bedside  Orders have been reviewed and implemented. Will continue to monitor the patient. Call light has been placed within reach and bed alarm has been activated.   Winferd Hatter BSN, RN-BC Phone number: [phone]

## 2023-05-08 NOTE — H&P (Signed)
 History and Physical   Vi Biddinger WUJ:[id number] DOB: 10-02-1975 DOA: 05/08/2023  I have briefly reviewed the patient's prior medical records in [facility] Link  PCP: Collins Dean, NP  Patient coming from: home   Chief Complaint: left ear pain  HPI: Kayla Scott is a 48 y.o. female with medical history significant of DM2, hypertension, hyperlipidemia, morbid obesity comes into the hospital with left ear pain.  This has been going on for the past 4 to 5 days, last Tuesday, was not getting any better.  [facility], was given amoxicillin .  She went home, pain got worse and unrelieved with over-the-counter pain medications, and decided come to the ER.  She denies any fever but does report subjective chills. She denies having this happen to her before.  No chest pain, no shortness of breath.  She states that her sugars have been slightly out of control over the last few days as well.  ED Course: In the emergency room she is afebrile, heart rate 103, normotensive.  She is satting well on room air.  Blood work shows a glucose of 319, white count 10.8 and otherwise unremarkable.  CT scan of the temporal bones showed left tympanomastoid effusion suggestive of mastoiditis and otitis media, no findings to suggest coalescent mastoiditis.  Case was discussed with ENT by EDP who recommended admission for IV antibiotics and pain control, did not recommend surgical intervention.  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 Isabella Mao office.  Reportedly recommended surgery, but could  not afford.   S/P C-section 10/18/2014   UTI (lower urinary tract infection)     Past Surgical History:  Procedure Laterality Date   CESAREAN SECTION N/A 11/09/1998   [place]   CESAREAN SECTION N/A 07/24/2001   [facility]  Premature delivery due to placenta previa   CESAREAN SECTION WITH BILATERAL TUBAL LIGATION Bilateral 10/18/2014   Procedure: REPEAT CESAREAN SECTION WITH BILATERAL TUBAL LIGATION;  Surgeon: Tresia Fruit, MD;  Location: [facility];  Service: Obstetrics;  Laterality: Bilateral;     reports that she has never smoked. She has never been exposed to tobacco smoke. She has never used smokeless tobacco. She reports that she does not drink alcohol and does not use drugs.  No Known Allergies  Family History  Problem Relation Age of Onset   Diabetes Mother    Colitis Mother    Diabetes Sister    Diabetes Brother    Cancer Paternal Grandmother        uterine    Prior to Admission medications   Medication Sig Start Date End Date Taking? Authorizing Provider  AgaMatrix Ultra-Thin Lancets MISC Check blood glucose twice daily before meals 02/25/22   Ronalee Cocking, MD  amoxicillin  (AMOXIL ) 500 MG capsule Take 1 capsule (500 mg total) by mouth 3 (three) times daily for 10 days. 05/07/23 05/17/23  Hassie Lint, PA-C  atorvastatin  (LIPITOR) 20 MG tablet Take 1 tablet (20 mg total) by mouth daily. 04/30/23   Fleming, [name] W, NP  Blood Glucose Monitoring Suppl (AGAMATRIX  PRESTO) w/Device KIT Check blood glucose before meals twice daily 02/25/22   Ronalee Cocking, MD  Blood Glucose Monitoring Suppl (TRUE METRIX METER) w/Device KIT Use as instructed. Check blood glucose level by fingerstick twice per day.  E11.65 12/06/22   Fleming, [name] W, NP  fluconazole  (DIFLUCAN ) 150 MG tablet For 1 week, take 150 mg (1 tablet) PO every 72 hours for 3 doses (day 1, 4, and 7). Then take 1 tablet (150 mg) once a week for 3 months until gone. 04/30/23   Fleming, [name] W, NP  fluconazole   (DIFLUCAN ) 150 MG tablet Take 1 tablet (150 mg total) by mouth once for 1 dose. After you complete your antibiotics 05/07/23 05/08/23  Hassie Lint, PA-C  fluticasone  (FLONASE ) 50 MCG/ACT nasal spray Place 2 sprays into both nostrils daily. 05/07/23   Hassie Lint, PA-C  glipiZIDE  (GLUCOTROL ) 10 MG tablet Take 1 tablet (10 mg total) by mouth 2 (two) times daily before a meal. 04/30/23   Collins Dean, NP  glucose blood (AGAMATRIX PRESTO TEST) test strip Check blood glucose before meals twice daily 02/25/22   Ronalee Cocking, MD  glucose blood (TRUE METRIX BLOOD GLUCOSE TEST) test strip Use as instructed. Check blood glucose level by fingerstick twice per day.  E11.65 12/06/22   Fleming, [name] W, NP  lidocaine  (LIDODERM ) 5 % Place 1 patch onto the skin daily. Remove & Discard patch within 12 hours or as directed by MD 07/04/22   Smoot, Genevive Ket, PA-C  lisinopril  (ZESTRIL ) 10 MG tablet Take 1 tablet (10 mg total) by mouth daily. 04/30/23   Fleming, [name] W, NP  loratadine  (CLARITIN ) 10 MG tablet Take 1 tablet (10 mg total) by mouth daily. 04/30/23   Fleming, [name] W, NP  meloxicam  (MOBIC ) 15 MG tablet Take 1 tablet (15 mg total) by mouth daily with a meal for 2 weeks, then once every 24 hours prn pain. 03/31/23   Gean Keels, MD  metFORMIN  (GLUCOPHAGE -XR) 500 MG 24 hr tablet Take 2 tablets (1,000 mg total) by mouth 2 (two) times daily with a meal. 04/30/23   Collins Dean, NP  methocarbamol  (ROBAXIN ) 750 MG tablet Take 1 tablet (750 mg total) by mouth every 8 (eight) hours as needed for muscle spasms. 10/10/22   Collins Dean, NP  Semaglutide , 1 MG/DOSE, 4 MG/3ML SOPN Inject 1 mg as directed once a week. 04/30/23   Fleming, [name] W, NP  triamcinolone  cream (KENALOG ) 0.1 % Apply twice daily to affected area 04/10/22   Ronalee Cocking, MD  TRUEplus Lancets 28G MISC Use as instructed. Check blood glucose level by fingerstick twice per day.  E11.65 12/06/22   Collins Dean, NP     Physical Exam: Vitals:   05/08/23 0865 05/08/23 0828 05/08/23 1337  BP: (!) 130/91  110/70  Pulse: (!) 103  (!) 117  Resp: 20  20  Temp: 98.2 F (36.8 C)  98.6 F (37 C)  TempSrc: Oral  Oral  SpO2: 100%  98%  Weight:  129.7 kg   Height:  5\' 4"  (1.626 m)     Constitutional: NAD, calm, comfortable Eyes: PERRL, lids and conjunctivae normal ENMT: Mucous membranes are moist. Posterior pharynx clear of any exudate or lesions. Tender above left mastoid area Neck: normal, supple Respiratory: clear to auscultation bilaterally, no wheezing, no crackles. Normal respiratory effort.  Cardiovascular: Regular rate and rhythm, no murmurs / rubs / gallops. No extremity edema.  Abdomen: no tenderness, no masses palpated. Bowel sounds  positive.  Musculoskeletal: no clubbing / cyanosis. Normal muscle tone.  Skin: no rashes, lesions, ulcers. No induration Neurologic: No focal deficits  Labs on Admission: I have personally reviewed following labs and imaging studies  CBC: Recent Labs  Lab 05/08/23 0833  WBC 10.8*  NEUTROABS 7.6  HGB 13.7  HCT 40.4  MCV 96.0  PLT 270   Basic Metabolic Panel: Recent Labs  Lab 05/08/23 0833  NA 136  K 3.7  CL 102  CO2 21*  GLUCOSE 319*  BUN 13  CREATININE 0.60  CALCIUM  9.2   Liver Function Tests: No results for input(s): "AST", "ALT", "ALKPHOS", "BILITOT", "PROT", "ALBUMIN" in the last 168 hours. Coagulation Profile: No results for input(s): "INR", "PROTIME" in the last 168 hours. BNP (last 3 results) No results for input(s): "PROBNP" in the last 8760 hours. CBG: Recent Labs  Lab 05/08/23 1644  GLUCAP 311*   Thyroid Function Tests: No results for input(s): "TSH", "T4TOTAL", "FREET4", "T3FREE", "THYROIDAB" in the last 72 hours. Urine analysis:    Component Value Date/Time   COLORURINE YELLOW 07/04/2022 1024   APPEARANCEUR CLEAR 07/04/2022 1024   LABSPEC 1.024 07/04/2022 1024   PHURINE 5.0 07/04/2022 1024   GLUCOSEU >=500 (A)  07/04/2022 1024   HGBUR NEGATIVE 07/04/2022 1024   BILIRUBINUR negative 05/07/2023 1415   KETONESUR negative 05/07/2023 1415   KETONESUR NEGATIVE 07/04/2022 1024   PROTEINUR NEGATIVE 07/04/2022 1024   UROBILINOGEN 0.2 05/07/2023 1415   UROBILINOGEN 0.2 06/03/2019 1040   NITRITE Negative 05/07/2023 1415   NITRITE NEGATIVE 07/04/2022 1024   LEUKOCYTESUR Negative 05/07/2023 1415   LEUKOCYTESUR NEGATIVE 07/04/2022 1024     Radiological Exams on Admission: CT Temporal Bones W Contrast Result Date: 05/08/2023 CLINICAL DATA:  Otitis media, ear pain and left facial pain. EXAM: CT TEMPORAL BONES WITH CONTRAST TECHNIQUE: Axial and coronal plane CT imaging of the petrous temporal bones was performed with thin-collimation image reconstruction following intravenous contrast administration. Multiplanar CT image reconstructions were also generated. RADIATION DOSE REDUCTION: This exam was performed according to the departmental dose-optimization program which includes automated exposure control, adjustment of the mA and/or kV according to patient size and/or use of iterative reconstruction technique. CONTRAST:  75mL OMNIPAQUE  IOHEXOL  350 MG/ML SOLN COMPARISON:  CT head 04/10/2004. FINDINGS: RIGHT TEMPORAL BONE External auditory canal: Normal. Middle ear cavity: Normally aerated. The scutum and ossicles are normal. The tegmen tympani is intact. Inner ear structures: The cochlea, vestibule and semicircular canals are normal. The vestibular aqueduct is not enlarged. Vestibular aqueduct also appears to communicate with the prominent right jugular bulb. Internal auditory and facial nerve canals:  Normal Mastoid air cells: The visualized mastoid air cells are normally aerated. Limited visualization of the right mastoid tip. No osseous erosion. LEFT TEMPORAL BONE External auditory canal: There is abnormal soft tissue extending into the osseous portion of the external auditory canal without evidence of nodular or masslike  soft tissue. No osseous erosion. External auditory canal is otherwise unremarkable. Middle ear cavity: Abnormal soft tissue noted involving and nearly completely opacifying the hypo tympanum. Abnormal soft tissue also noted in the mesotympanum and epitympanum surrounding the ossicles. The ossicles are normal in morphology without evidence of erosive change. The scutum is intact. Tegmen tympani is intact. Inner ear structures: The cochlea, vestibule and semicircular canals are normal. The vestibular aqueduct is not enlarged. Internal auditory and facial nerve canals: Osseous prominence at the porous acusticus resulting in mild narrowing of the medial internal auditory canal. The internal auditory canals otherwise unremarkable.  Mastoid air cells: Moderate mastoid effusion most pronounced in the mastoid tip. No erosive change. Vascular: The carotid canals are unremarkable. Right jugular bulb is mildly prominent with thinning of the overlying sigmoid plate without dehiscence. Limited intracranial:  No acute or significant finding. Visible orbits/paranasal sinuses: No acute or significant finding in the orbits. Near complete opacification of the left maxillary sinus. Additional moderate mucosal thickening in the left ethmoid sinus and left sphenoid sinus with possible air-fluid level in the left sphenoid sinus. Soft tissues: Normal. IMPRESSION: Left tympanomastoid effusion suggestive of mastoiditis and otitis media. No findings to suggest coalescent mastoiditis. Fluid within the left middle ear cavity limits evaluation for small mass lesion. There is no erosive change of the ossicles. Thickening of the left tympanic membrane. Abnormal soft tissue involving the left external auditory canal which may be related to chronic inflammation. No masslike soft tissue or osseous erosion. Mild osseous narrowing of the medial left internal auditory canal. Prominent right jugular bulb with thinning of the overlying sigmoid plate  without evidence of dehiscence. Paranasal sinus disease as above. Possible air-fluid level in the left sphenoid sinus. Recommend correlation acute sinusitis. Electronically Signed   By: Denny Flack M.D.   On: 05/08/2023 12:52     Assessment/Plan Principal problem Left mastoiditis and otitis media -EDP discussed case with ENT, for now recommending IV antibiotics and pain control.  Will start antibiotics, closely monitor clinical status.  If she does not get better may need formal ENT consult in the morning -Currently she is nontoxic-appearing, afebrile, leukocytosis is minimal  Active problems Type 2 diabetes mellitus, poorly controlled, with hyperglycemia -sugar in the 300 in the ED.  She was given 5 units of NovoLog .  Will place on moderate sliding scale.  Hold oral agents  Essential hypertension-she is normotensive here, hold lisinopril   Hyperlipidemia-resume home statin  Obesity, morbid-BMI 49.  I see that she is on semaglutide , continue outpatient management  DVT prophylaxis: Lovenox   Code Status: Full code  Family Communication: family at bedside  Disposition Plan: home when ready  Bed Type: medsurg Consults called: ENT  Obs/Inp: obs   Kathlen Para, MD, PhD [facility]  Contact via [email or web address]  05/08/2023, 5:28 PM

## 2023-05-08 NOTE — ED Triage Notes (Signed)
 Complains of ear pain that started on Tuesday.  Saw PCP yesterday started antibiotic but not getting better.  Patient can't quite crying. Reports the whole left side of her face is hurting.

## 2023-05-08 NOTE — Progress Notes (Signed)
 ED Pharmacy Antibiotic Sign Off An antibiotic consult was received from an ED provider for vancomycin  per pharmacy dosing for mastoiditis. A chart review was completed to assess appropriateness.   The following one time order(s) were placed:  Vancomycin  1750 mg IV x 1  Further antibiotic and/or antibiotic pharmacy consults should be ordered by the admitting provider if indicated.   Thank you for allowing pharmacy to be a part of this patient's care.   Trinidad Funk, [facility]  Clinical Pharmacist 05/08/23 4:14 PM

## 2023-05-09 ENCOUNTER — Other Ambulatory Visit: Payer: Self-pay

## 2023-05-09 DIAGNOSIS — H6092 Unspecified otitis externa, left ear: Secondary | ICD-10-CM

## 2023-05-09 DIAGNOSIS — H6690 Otitis media, unspecified, unspecified ear: Secondary | ICD-10-CM | POA: Diagnosis present

## 2023-05-09 DIAGNOSIS — H672 Otitis media in diseases classified elsewhere, left ear: Secondary | ICD-10-CM

## 2023-05-09 LAB — COMPREHENSIVE METABOLIC PANEL WITH GFR
ALT: 26 U/L (ref 0–44)
AST: 15 U/L (ref 15–41)
Albumin: 2.4 g/dL — ABNORMAL LOW (ref 3.5–5.0)
Alkaline Phosphatase: 68 U/L (ref 38–126)
Anion gap: 10 (ref 5–15)
BUN: 11 mg/dL (ref 6–20)
CO2: 24 mmol/L (ref 22–32)
Calcium: 8.7 mg/dL — ABNORMAL LOW (ref 8.9–10.3)
Chloride: 101 mmol/L (ref 98–111)
Creatinine, Ser: 0.64 mg/dL (ref 0.44–1.00)
GFR, Estimated: >60 mL/min (ref >60)
Glucose, Bld: 274 mg/dL — ABNORMAL HIGH (ref 70–99)
Potassium: 3.8 mmol/L (ref 3.5–5.1)
Sodium: 135 mmol/L (ref 135–145)
Total Bilirubin: 0.7 mg/dL (ref 0.0–1.2)
Total Protein: 6.3 g/dL — ABNORMAL LOW (ref 6.5–8.1)

## 2023-05-09 LAB — GLUCOSE, CAPILLARY
Glucose-Capillary: 202 mg/dL — ABNORMAL HIGH (ref 70–99)
Glucose-Capillary: 232 mg/dL — ABNORMAL HIGH (ref 70–99)
Glucose-Capillary: 239 mg/dL — ABNORMAL HIGH (ref 70–99)
Glucose-Capillary: 171 mg/dL — ABNORMAL HIGH (ref 70–99)
Glucose-Capillary: 265 mg/dL — ABNORMAL HIGH (ref 70–99)

## 2023-05-09 LAB — MRSA NEXT GEN BY PCR, NASAL: MRSA by PCR Next Gen: NOT DETECTED

## 2023-05-09 LAB — HIV ANTIBODY (ROUTINE TESTING W REFLEX): HIV Screen 4th Generation wRfx: NONREACTIVE

## 2023-05-09 MED ORDER — HYDROMORPHONE HCL 1 MG/ML IJ SOLN
0.5000 mg | INTRAMUSCULAR | Status: DC | PRN
  Administered 2023-05-09 – 2023-05-11 (×5): 0.5 mg via INTRAVENOUS
  Filled 2023-05-09 (×6): qty 0.5

## 2023-05-09 MED ORDER — CIPROFLOXACIN-DEXAMETHASONE 0.3-0.1 % OT SUSP
4.0000 [drp] | Freq: Two times a day (BID) | OTIC | Status: DC
Start: 2023-05-09 — End: 2023-05-11
  Administered 2023-05-09 – 2023-05-11 (×5): 4 [drp] via OTIC
  Filled 2023-05-09 (×2): qty 7.5

## 2023-05-09 MED ORDER — PIPERACILLIN-TAZOBACTAM 3.375 G IVPB
3.3750 g | Freq: Three times a day (TID) | INTRAVENOUS | Status: DC
  Administered 2023-05-09 – 2023-05-11 (×6): 3.375 g via INTRAVENOUS
  Filled 2023-05-09 (×6): qty 50

## 2023-05-09 MED ORDER — VANCOMYCIN HCL 1250 MG/250ML IV SOLN
1250.0000 mg | Freq: Two times a day (BID) | INTRAVENOUS | Status: DC
  Administered 2023-05-10: 1250 mg via INTRAVENOUS
  Filled 2023-05-09 (×2): qty 250

## 2023-05-09 MED ORDER — VANCOMYCIN HCL 2000 MG/400ML IV SOLN
2000.0000 mg | Freq: Once | INTRAVENOUS | Status: AC
  Administered 2023-05-09: 2000 mg via INTRAVENOUS
  Filled 2023-05-09: qty 400

## 2023-05-09 MED ORDER — ENOXAPARIN SODIUM 80 MG/0.8ML IJ SOSY
70.0000 mg | PREFILLED_SYRINGE | INTRAMUSCULAR | Status: DC
  Administered 2023-05-09 – 2023-05-10 (×2): 70 mg via SUBCUTANEOUS
  Filled 2023-05-09 (×3): qty 0.7

## 2023-05-09 NOTE — Inpatient Diabetes Management (Addendum)
 Inpatient Diabetes Program Recommendations  AACE/ADA: New Consensus Statement on Inpatient Glycemic Control (2015)  Target Ranges:  Prepandial:   less than 140 mg/dL      Peak postprandial:   less than 180 mg/dL (1-2 hours)      Critically ill patients:  140 - 180 mg/dL   Lab Results  Component Value Date   GLUCAP 239 (H) 05/09/2023   HGBA1C 11.1 (A) 04/30/2023    Review of Glycemic Control  Diabetes history: DM 2 Outpatient Diabetes medications: Ozempic  0.5 mg weekly [facility] pharmacy), Glipizide  10 mg bid ([facility]), metformin  1000 mg bid ([facility])  A1c 11.1% increased from 10.9% since starting ozempic  and had dose titration on 4/16 Current orders for Inpatient glycemic control:  Novolog  0-15 units tid + hs  Spoke with pt and daughters at bedside. Pt takes metformin  and glipizide  and gets it from [employer], Pt also takes Ozempic  based on a grant received and obtains that through the [facility] pharmacy. Pt is part of the [facility] program. Discussed the need for insulin  at discharge described it was the same as the operation of Ozempic  and the dose would be once a day ad it is safe to take in conjunction with her other medications.   Spoke with Luke, Van Ausdall, RPH-CPP over the care of the pt outpt to discuss outpatient medications and options for pt and follow up after hospitalization. Basaglar is what is preferred. It would also reduce patient confusion and burden to transfer all prescriptions to the [facility] pharmacy listed in chart.  Thanks,  Eloise Hake RN, MSN, BC-ADM Inpatient Diabetes Coordinator Team Pager [phone] (8a-5p)

## 2023-05-09 NOTE — Consult Note (Signed)
 [facility]  Total days of antibiotics 2         Reason for Consult: mastoiditis/otitis media/externa   Referring Physician: gherghe  Principal Problem:   Mastoiditis Active Problems:   Otitis media    HPI: Kayla Scott is a 48 y.o. female with T2DM, poorly controlled, obesity, who was admitted on 4/24 for 5 days history of left ear pain. She was given amoxicillin  by her pcp on 1/23 but felt her left ear pain worsened with increasing left sided headache, tenderness behind ear, some swelling near tragus. No overt fevers but chills. Her BS have been running higher. In the ED, she had wbc of 11K, but BS in 300s. She underwent CT that showed otitis media with tympanomastoic effusion. She was admitted with ceftriaxone  and better control of BS. This morning her left ear is draining perfusely. She was started on vancomycin  for staph aureus coverage. She was seen by ENT who felt more that she had OM/OE rather than mastoiditis. She is started on ciprodex  drops. She feels slightly better but still pain with opening mouth/jaw.due to pain to left ear. Family is at bedside interpreting.   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 Isabella Mao office.  Reportedly recommended surgery, but could not afford.   S/P C-section 10/18/2014   UTI (lower urinary tract infection)     Allergies: No Known Allergies   MEDICATIONS:  atorvastatin   20 mg Oral Daily   ciprofloxacin -dexamethasone   4 drop Left EAR BID   enoxaparin  (LOVENOX ) injection  70 mg Subcutaneous Q24H   insulin  aspart  0-15 Units Subcutaneous TID WC   insulin  aspart  0-5 Units Subcutaneous QHS    Social History   Tobacco Use   Smoking status: Never    Passive exposure:  Never   Smokeless tobacco: Never  Vaping Use   Vaping status: Never Used  Substance Use Topics   Alcohol use: No   Drug use: No    Family History  Problem Relation Age of Onset   Diabetes Mother    Colitis Mother    Diabetes Sister    Diabetes Brother    Cancer Paternal Grandmother        uterine    Review of Systems -  12 point ros is reviewed except what is mentioned above  OBJECTIVE: Temp:  [97.9 F (36.6 C)-99.3 F (37.4 C)] 98.3 F (36.8 C) (04/25 1627) Pulse Rate:  [94-118] 116 (04/25 1627) Resp:  [18-20] 18 (04/25 1627) BP: (104-144)/(51-92) 119/63 (04/25 1627) SpO2:  [94 %-100 %] 97 % (04/25 1627) Physical Exam  Constitutional:  oriented to person, place, and time. appears well-developed and well-nourished. No distress.  HENT: [place]/AT, PERRLA, no scleral icterus. Left ear serous drainage. TTP pinna, anterior tragus. +LN Mouth/Throat: Oropharynx is clear and moist. No oropharyngeal exudate.  Cardiovascular: Normal rate, regular rhythm and normal heart sounds. Exam reveals no gallop and no friction rub.  No murmur heard.  Pulmonary/Chest: Effort normal and breath sounds normal. No respiratory distress.  has no wheezes.  Neck = supple, no nuchal rigidity Abdominal: Soft. Bowel sounds are normal.  exhibits no distension. There is no tenderness.  Lymphadenopathy: no cervical adenopathy. No axillary adenopathy Neurological: alert  and oriented to person, place, and time.  Skin: Skin is warm and dry. No rash noted. No erythema.  Psychiatric: a normal mood and affect.  behavior is normal.    LABS: Results for orders placed or performed during the hospital encounter of 05/08/23 (from the past 48 hours)  Basic metabolic panel     Status: Abnormal   Collection Time: 05/08/23  8:33 AM  Result Value Ref Range   Sodium 136 135 - 145 mmol/L   Potassium 3.7 3.5 - 5.1 mmol/L   Chloride 102 98 - 111 mmol/L   CO2 21 (L) 22 - 32 mmol/L   Glucose, Bld 319 (H) 70 - 99 mg/dL     Comment: Glucose reference range applies only to samples taken after fasting for at least 8 hours.   BUN 13 6 - 20 mg/dL   Creatinine, Ser 2.84 0.44 - 1.00 mg/dL   Calcium  9.2 8.9 - 10.3 mg/dL   GFR, Estimated >13 >24 mL/min    Comment: (NOTE) Calculated using the CKD-EPI Creatinine Equation (2021)    Anion gap 13 5 - 15    Comment: Performed at [facility], [address] [place], [place] [address]  CBC with Differential     Status: Abnormal   Collection Time: 05/08/23  8:33 AM  Result Value Ref Range   WBC 10.8 (H) 4.0 - 10.5 K/uL   RBC 4.21 3.87 - 5.11 MIL/uL   Hemoglobin 13.7 12.0 - 15.0 g/dL   HCT 72.5 36.6 - 44.0 %   MCV 96.0 80.0 - 100.0 fL   MCH 32.5 26.0 - 34.0 pg   MCHC 33.9 30.0 - 36.0 g/dL   RDW 34.7 42.5 - 95.6 %   Platelets 270 150 - 400 K/uL   nRBC 0.0 0.0 - 0.2 %   Neutrophils Relative % 70 %   Neutro Abs 7.6 1.7 - 7.7 K/uL   Lymphocytes Relative 21 %   Lymphs Abs 2.3 0.7 - 4.0 K/uL   Monocytes Relative 8 %   Monocytes Absolute 0.9 0.1 - 1.0 K/uL   Eosinophils Relative 1 %   Eosinophils Absolute 0.1 0.0 - 0.5 K/uL   Basophils Relative 0 %   Basophils Absolute 0.0 0.0 - 0.1 K/uL   Immature Granulocytes 0 %   Abs Immature Granulocytes 0.03 0.00 - 0.07 K/uL    Comment: Performed at [facility], [address] [place], [place] [address]  POC CBG, ED     Status: Abnormal   Collection Time: 05/08/23  4:44 PM  Result Value Ref Range   Glucose-Capillary 311 (H) 70 - 99 mg/dL    Comment: Glucose reference range applies only to samples taken after fasting for at least 8 hours.  Glucose, capillary     Status: Abnormal   Collection Time: 05/08/23  8:50 PM  Result Value Ref Range   Glucose-Capillary 280 (H) 70 - 99 mg/dL    Comment: Glucose reference range applies only to samples taken after fasting for at least 8 hours.  Comprehensive metabolic panel     Status: Abnormal   Collection Time: 05/08/23 11:23 PM  Result Value Ref Range   Sodium 135 135  - 145 mmol/L   Potassium 3.8 3.5 - 5.1 mmol/L   Chloride 101 98 - 111 mmol/L   CO2 24 22 - 32 mmol/L   Glucose, Bld 274 (H) 70 - 99 mg/dL    Comment: Glucose reference range applies only to samples taken after fasting  for at least 8 hours.   BUN 11 6 - 20 mg/dL   Creatinine, Ser 1.61 0.44 - 1.00 mg/dL   Calcium  8.7 (L) 8.9 - 10.3 mg/dL   Total Protein 6.3 (L) 6.5 - 8.1 g/dL   Albumin 2.4 (L) 3.5 - 5.0 g/dL   AST 15 15 - 41 U/L   ALT 26 0 - 44 U/L   Alkaline Phosphatase 68 38 - 126 U/L   Total Bilirubin 0.7 0.0 - 1.2 mg/dL   GFR, Estimated >09 >60 mL/min    Comment: (NOTE) Calculated using the CKD-EPI Creatinine Equation (2021)    Anion gap 10 5 - 15    Comment: Performed at [facility], [address] [place], [place] [address]  CBC     Status: Abnormal   Collection Time: 05/08/23 11:23 PM  Result Value Ref Range   WBC 11.2 (H) 4.0 - 10.5 K/uL   RBC 3.80 (L) 3.87 - 5.11 MIL/uL   Hemoglobin 12.4 12.0 - 15.0 g/dL   HCT 81.1 91.4 - 78.2 %   MCV 96.8 80.0 - 100.0 fL   MCH 32.6 26.0 - 34.0 pg   MCHC 33.7 30.0 - 36.0 g/dL   RDW 95.6 21.3 - 08.6 %   Platelets 246 150 - 400 K/uL   nRBC 0.0 0.0 - 0.2 %    Comment: Performed at [facility], [address], [place], [place] [address]  HIV Antibody (routine testing w rflx)     Status: None   Collection Time: 05/08/23 11:23 PM  Result Value Ref Range   HIV Screen 4th Generation wRfx Non Reactive Non Reactive    Comment: Performed at [facility], [address] [place], [place] [address]  Glucose, capillary     Status: Abnormal   Collection Time: 05/09/23  7:40 AM  Result Value Ref Range   Glucose-Capillary 202 (H) 70 - 99 mg/dL    Comment: Glucose reference range applies only to samples taken after fasting for at least 8 hours.  MRSA Next Gen by PCR, Nasal     Status: None   Collection Time: 05/09/23  9:57 AM   Specimen: Nasal Mucosa; Nasal Swab  Result Value Ref Range   MRSA by PCR Next Gen NOT DETECTED NOT  DETECTED    Comment: (NOTE) The GeneXpert MRSA Assay (FDA approved for NASAL specimens only), is one component of a comprehensive MRSA colonization surveillance program. It is not intended to diagnose MRSA infection nor to guide or monitor treatment for MRSA infections. Test performance is not FDA approved in patients less than 15 years old. Performed at [facility], [address], [place], [place] [address]   Glucose, capillary     Status: Abnormal   Collection Time: 05/09/23 11:27 AM  Result Value Ref Range   Glucose-Capillary 232 (H) 70 - 99 mg/dL    Comment: Glucose reference range applies only to samples taken after fasting for at least 8 hours.  Glucose, capillary     Status: Abnormal   Collection Time: 05/09/23 12:33 PM  Result Value Ref Range   Glucose-Capillary 239 (H) 70 - 99 mg/dL    Comment: Glucose reference range applies only to samples taken after fasting for at least 8 hours.  Glucose, capillary     Status: Abnormal   Collection Time: 05/09/23  4:27 PM  Result Value Ref Range   Glucose-Capillary 171 (H) 70 - 99 mg/dL    Comment: Glucose reference  range applies only to samples taken after fasting for at least 8 hours.    MICRO: Not mrsa colonized  IMAGING: CT Temporal Bones W Contrast Result Date: 05/08/2023 CLINICAL DATA:  Otitis media, ear pain and left facial pain. EXAM: CT TEMPORAL BONES WITH CONTRAST TECHNIQUE: Axial and coronal plane CT imaging of the petrous temporal bones was performed with thin-collimation image reconstruction following intravenous contrast administration. Multiplanar CT image reconstructions were also generated. RADIATION DOSE REDUCTION: This exam was performed according to the departmental dose-optimization program which includes automated exposure control, adjustment of the mA and/or kV according to patient size and/or use of iterative reconstruction technique. CONTRAST:  75mL OMNIPAQUE  IOHEXOL  350 MG/ML SOLN COMPARISON:  CT head  04/10/2004. FINDINGS: RIGHT TEMPORAL BONE External auditory canal: Normal. Middle ear cavity: Normally aerated. The scutum and ossicles are normal. The tegmen tympani is intact. Inner ear structures: The cochlea, vestibule and semicircular canals are normal. The vestibular aqueduct is not enlarged. Vestibular aqueduct also appears to communicate with the prominent right jugular bulb. Internal auditory and facial nerve canals:  Normal Mastoid air cells: The visualized mastoid air cells are normally aerated. Limited visualization of the right mastoid tip. No osseous erosion. LEFT TEMPORAL BONE External auditory canal: There is abnormal soft tissue extending into the osseous portion of the external auditory canal without evidence of nodular or masslike soft tissue. No osseous erosion. External auditory canal is otherwise unremarkable. Middle ear cavity: Abnormal soft tissue noted involving and nearly completely opacifying the hypo tympanum. Abnormal soft tissue also noted in the mesotympanum and epitympanum surrounding the ossicles. The ossicles are normal in morphology without evidence of erosive change. The scutum is intact. Tegmen tympani is intact. Inner ear structures: The cochlea, vestibule and semicircular canals are normal. The vestibular aqueduct is not enlarged. Internal auditory and facial nerve canals: Osseous prominence at the porous acusticus resulting in mild narrowing of the medial internal auditory canal. The internal auditory canals otherwise unremarkable. Mastoid air cells: Moderate mastoid effusion most pronounced in the mastoid tip. No erosive change. Vascular: The carotid canals are unremarkable. Right jugular bulb is mildly prominent with thinning of the overlying sigmoid plate without dehiscence. Limited intracranial:  No acute or significant finding. Visible orbits/paranasal sinuses: No acute or significant finding in the orbits. Near complete opacification of the left maxillary sinus.  Additional moderate mucosal thickening in the left ethmoid sinus and left sphenoid sinus with possible air-fluid level in the left sphenoid sinus. Soft tissues: Normal. IMPRESSION: Left tympanomastoid effusion suggestive of mastoiditis and otitis media. No findings to suggest coalescent mastoiditis. Fluid within the left middle ear cavity limits evaluation for small mass lesion. There is no erosive change of the ossicles. Thickening of the left tympanic membrane. Abnormal soft tissue involving the left external auditory canal which may be related to chronic inflammation. No masslike soft tissue or osseous erosion. Mild osseous narrowing of the medial left internal auditory canal. Prominent right jugular bulb with thinning of the overlying sigmoid plate without evidence of dehiscence. Paranasal sinus disease as above. Possible air-fluid level in the left sphenoid sinus. Recommend correlation acute sinusitis. Electronically Signed   By: Denny Flack M.D.   On: 05/08/2023 12:52    Assessment/Plan:  48yo F with poorly controlled T2DM admitted for left ear pain found to have otitis externa/media +/- mastoiditis. Currently on vancomycin  plus piptazo. OM/OE often caused by h.flu or morexella which may not necessarily respond to amoxicillin  alone.  - agree with expanding coverage to include pseudomonas. As  well as ciprodex . If improved over night, can switch to oral abtx. Will see how she does in the morning for oral conversion  -T2DM - appears that she recently stopped metformin  and glipizide . But may need insulin  based regimen given her HGBA1C is 11.  I have personally spent 80 minutes involved in face-to-face and non-face-to-face activities for this patient on the day of the visit. Professional time spent includes the following activities: Preparing to see the patient (review of tests), Obtaining and/or reviewing separately obtained history (admission/discharge record), Performing a medically appropriate  examination and/or evaluation , Ordering medications/tests/procedures, referring and communicating with other health care professionals, Documenting clinical information in the EMR, Independently interpreting results (not separately reported), Communicating results to the patient/family/caregiver, Counseling and educating the patient/family/caregiver and Care coordination (not separately reported).

## 2023-05-09 NOTE — Progress Notes (Addendum)
 PROGRESS NOTE  Kayla Scott ZOX:[id number] DOB: April 06, 1975 DOA: 05/08/2023 PCP: Collins Dean, NP   LOS: 0 days   Brief Narrative / Interim history: 48 year old female with DM2, HTN, HLD, morbid obesity who comes into the hospital for left ear pain, this has been going on for the past 4 to 5 days, not getting any better.  She was prescribed amoxicillin  as an outpatient, took it for a day but given worsening of her pain came to the ER.  She was placed on antibiotics and admitted to the hospital  Subjective / 24h Interval events: She tells me she is feeling much worse this morning, pain is gotten worse since yesterday.  Feels like her left side of her head is hurting, is also numb, and can barely open her mouth.  She also noticed that she started to have a clear discharge from her left ear  Assesement and Plan: Principal problem Left otitis media/mastoiditis -patient was placed on Unasyn  on admission on 4/24, this morning she appears to be getting worse with worsening pain, inability to open her mouth and numbness on the left side of her face.  Discussed with Dr. Virgia Griffins, ENT consulted, appreciate input - Will obtain ID input as well, discussed with Dr. Levern Reader, will add vancomycin .  Aortic root difficulty culture data  Active problems Essential hypertension-continue to hold lisinopril  given normotension active infection  Hyperlipidemia-continue statin  Obesity, class III-BMI 49.  She would benefit from weight loss  DM2, poorly controlled, with hyperglycemia -continue sliding scale.  May benefit from long-acting insulin , Basaglar would be covered at her pharmacy  CBG (last 3)  Recent Labs    05/08/23 1644 05/08/23 2050 05/09/23 0740  GLUCAP 311* 280* 202*    Scheduled Meds:  atorvastatin   20 mg Oral Daily   ciprofloxacin -dexamethasone   4 drop Left EAR BID   enoxaparin  (LOVENOX ) injection  115 mg Subcutaneous Q24H   insulin  aspart  0-15 Units Subcutaneous TID WC    insulin  aspart  0-5 Units Subcutaneous QHS   Continuous Infusions:  ampicillin -sulbactam (UNASYN ) IV 3 g (05/09/23 0528)   PRN Meds:.bisacodyl , ondansetron  **OR** ondansetron  (ZOFRAN ) IV, oxyCODONE   Current Outpatient Medications  Medication Instructions   AgaMatrix Ultra-Thin Lancets MISC Check blood glucose twice daily before meals   amoxicillin  (AMOXIL ) 500 mg, Oral, 3 times daily   atorvastatin  (LIPITOR) 20 mg, Oral, Daily   Blood Glucose Monitoring Suppl (AGAMATRIX PRESTO) w/Device KIT Check blood glucose before meals twice daily   Blood Glucose Monitoring Suppl (TRUE METRIX METER) w/Device KIT Use as instructed. Check blood glucose level by fingerstick twice per day.  E11.65   fluconazole  (DIFLUCAN ) 150 MG tablet For 1 week, take 150 mg (1 tablet) PO every 72 hours for 3 doses (day 1, 4, and 7). Then take 1 tablet (150 mg) once a week for 3 months until gone.   fluticasone  (FLONASE ) 50 MCG/ACT nasal spray 2 sprays, Each Nare, Daily   glipiZIDE  (GLUCOTROL ) 10 mg, Oral, 2 times daily before meals   glucose blood (AGAMATRIX PRESTO TEST) test strip Check blood glucose before meals twice daily   glucose blood (TRUE METRIX BLOOD GLUCOSE TEST) test strip Use as instructed. Check blood glucose level by fingerstick twice per day.  E11.65   lidocaine  (LIDODERM ) 5 % 1 patch, Transdermal, Every 24 hours, Remove & Discard patch within 12 hours or as directed by MD   lisinopril  (ZESTRIL ) 10 mg, Oral, Daily   loratadine  (CLARITIN ) 10 mg, Oral, Daily   meloxicam  (MOBIC ) 15  MG tablet Take 1 tablet (15 mg total) by mouth daily with a meal for 2 weeks, then once every 24 hours prn pain.   metFORMIN  (GLUCOPHAGE -XR) 1,000 mg, Oral, 2 times daily with meals   methocarbamol  (ROBAXIN ) 750 mg, Oral, Every 8 hours PRN   Ozempic  (1 MG/DOSE) 1 mg, Injection, Weekly   triamcinolone  cream (KENALOG ) 0.1 % Apply twice daily to affected area   TRUEplus Lancets 28G MISC Use as instructed. Check blood glucose level by  fingerstick twice per day.  E11.65    Diet Orders (From admission, onward)     Start     Ordered   05/08/23 1902  Diet Carb Modified Fluid consistency: Thin; Room service appropriate? Yes  Diet effective now       Question Answer Comment  Diet-HS Snack? Nothing   Calorie Level Medium 1600-2000   Fluid consistency: Thin   Room service appropriate? Yes      05/08/23 1901            DVT prophylaxis:    Lab Results  Component Value Date   PLT 246 05/08/2023      Code Status: Full Code  Family Communication: Husband at bedside  Status is: Observation The patient will require care spanning > 2 midnights and should be moved to inpatient because: Worsening clinically, ENT, ID consulted, antibiotics have been broadened  Level of care: Med-Surg  Consultants:  ENT ID  Objective: Vitals:   05/08/23 2025 05/09/23 0526 05/09/23 0738 05/09/23 1004  BP: 126/61 (!) 114/56 (!) 104/51 135/71  Pulse: 94 (!) 101 98   Resp: 18 18 18    Temp: 97.9 F (36.6 C) 98.5 F (36.9 C) 98.1 F (36.7 C)   TempSrc: Oral Oral    SpO2: 100% 97% 94%   Weight:      Height:        Intake/Output Summary (Last 24 hours) at 05/09/2023 1029 Last data filed at 05/09/2023 0400 Gross per 24 hour  Intake 200 ml  Output --  Net 200 ml   Wt Readings from Last 3 Encounters:  05/08/23 129.7 kg  05/07/23 129.7 kg  04/30/23 126.6 kg    Examination: Constitutional: NAD Eyes: no scleral icterus ENMT: Mucous membranes are moist.  Neck: normal, supple Respiratory: clear to auscultation bilaterally, no wheezing, no crackles.  Cardiovascular: Regular rate and rhythm, no murmurs / rubs / gallops. No LE edema.  Abdomen: non distended, no tenderness. Bowel sounds positive.  Musculoskeletal: no clubbing / cyanosis.   Data Reviewed: I have independently reviewed following labs and imaging studies   CBC Recent Labs  Lab 05/08/23 0833 05/08/23 2323  WBC 10.8* 11.2*  HGB 13.7 12.4  HCT 40.4 36.8   PLT 270 246  MCV 96.0 96.8  MCH 32.5 32.6  MCHC 33.9 33.7  RDW 12.8 13.2  LYMPHSABS 2.3  --   MONOABS 0.9  --   EOSABS 0.1  --   BASOSABS 0.0  --     Recent Labs  Lab 05/08/23 0833 05/08/23 2323  NA 136 135  K 3.7 3.8  CL 102 101  CO2 21* 24  GLUCOSE 319* 274*  BUN 13 11  CREATININE 0.60 0.64  CALCIUM  9.2 8.7*  AST  --  15  ALT  --  26  ALKPHOS  --  68  BILITOT  --  0.7  ALBUMIN  --  2.4*    ------------------------------------------------------------------------------------------------------------------ No results for input(s): "CHOL", "HDL", "LDLCALC", "TRIG", "CHOLHDL", "LDLDIRECT" in the last 72  hours.  Lab Results  Component Value Date   HGBA1C 11.1 (A) 04/30/2023   ------------------------------------------------------------------------------------------------------------------ No results for input(s): "TSH", "T4TOTAL", "T3FREE", "THYROIDAB" in the last 72 hours.  Invalid input(s): "FREET3"  Cardiac Enzymes No results for input(s): "CKMB", "TROPONINI", "MYOGLOBIN" in the last 168 hours.  Invalid input(s): "CK" ------------------------------------------------------------------------------------------------------------------ No results found for: "BNP"  CBG: Recent Labs  Lab 05/08/23 1644 05/08/23 2050 05/09/23 0740  GLUCAP 311* 280* 202*    No results found for this or any previous visit (from the past 240 hours).   Radiology Studies: CT Temporal Bones W Contrast Result Date: 05/08/2023 CLINICAL DATA:  Otitis media, ear pain and left facial pain. EXAM: CT TEMPORAL BONES WITH CONTRAST TECHNIQUE: Axial and coronal plane CT imaging of the petrous temporal bones was performed with thin-collimation image reconstruction following intravenous contrast administration. Multiplanar CT image reconstructions were also generated. RADIATION DOSE REDUCTION: This exam was performed according to the departmental dose-optimization program which includes automated  exposure control, adjustment of the mA and/or kV according to patient size and/or use of iterative reconstruction technique. CONTRAST:  75mL OMNIPAQUE  IOHEXOL  350 MG/ML SOLN COMPARISON:  CT head 04/10/2004. FINDINGS: RIGHT TEMPORAL BONE External auditory canal: Normal. Middle ear cavity: Normally aerated. The scutum and ossicles are normal. The tegmen tympani is intact. Inner ear structures: The cochlea, vestibule and semicircular canals are normal. The vestibular aqueduct is not enlarged. Vestibular aqueduct also appears to communicate with the prominent right jugular bulb. Internal auditory and facial nerve canals:  Normal Mastoid air cells: The visualized mastoid air cells are normally aerated. Limited visualization of the right mastoid tip. No osseous erosion. LEFT TEMPORAL BONE External auditory canal: There is abnormal soft tissue extending into the osseous portion of the external auditory canal without evidence of nodular or masslike soft tissue. No osseous erosion. External auditory canal is otherwise unremarkable. Middle ear cavity: Abnormal soft tissue noted involving and nearly completely opacifying the hypo tympanum. Abnormal soft tissue also noted in the mesotympanum and epitympanum surrounding the ossicles. The ossicles are normal in morphology without evidence of erosive change. The scutum is intact. Tegmen tympani is intact. Inner ear structures: The cochlea, vestibule and semicircular canals are normal. The vestibular aqueduct is not enlarged. Internal auditory and facial nerve canals: Osseous prominence at the porous acusticus resulting in mild narrowing of the medial internal auditory canal. The internal auditory canals otherwise unremarkable. Mastoid air cells: Moderate mastoid effusion most pronounced in the mastoid tip. No erosive change. Vascular: The carotid canals are unremarkable. Right jugular bulb is mildly prominent with thinning of the overlying sigmoid plate without dehiscence. Limited  intracranial:  No acute or significant finding. Visible orbits/paranasal sinuses: No acute or significant finding in the orbits. Near complete opacification of the left maxillary sinus. Additional moderate mucosal thickening in the left ethmoid sinus and left sphenoid sinus with possible air-fluid level in the left sphenoid sinus. Soft tissues: Normal. IMPRESSION: Left tympanomastoid effusion suggestive of mastoiditis and otitis media. No findings to suggest coalescent mastoiditis. Fluid within the left middle ear cavity limits evaluation for small mass lesion. There is no erosive change of the ossicles. Thickening of the left tympanic membrane. Abnormal soft tissue involving the left external auditory canal which may be related to chronic inflammation. No masslike soft tissue or osseous erosion. Mild osseous narrowing of the medial left internal auditory canal. Prominent right jugular bulb with thinning of the overlying sigmoid plate without evidence of dehiscence. Paranasal sinus disease as above. Possible air-fluid level in  the left sphenoid sinus. Recommend correlation acute sinusitis. Electronically Signed   By: Denny Flack M.D.   On: 05/08/2023 12:52    Kathlen Para, MD, PhD [facility]  Between 7 am - 7 pm I am available, please contact me via Amion (for emergencies) or Securechat (non urgent messages)  Between 7 pm - 7 am I am not available, please contact night coverage MD/APP via Amion

## 2023-05-09 NOTE — Progress Notes (Addendum)
 MEWS Progress Note  Patient Details Name: Kayla Scott MRN: [id number] DOB: [birth year] Today's Date: 05/09/2023  Aldona Amel, MD and Tisha Forget RN made aware; MEWS yellow due to heart rate 111-116. Provider suspects elevated HR is due to stress, infection, pain. Pt asymptomatic & not currently in distress at this time. BP, MAP, O2 sats WNL.   MEWS Flowsheet Documentation:  Assess: MEWS Score Temp: 98.3 F (36.8 C) BP: 119/63 MAP (mmHg): 76 Pulse Rate: (!) 116 (RN aware; Per order Notify MD for HR less than 55 or more than 120) Resp: 18 Level of Consciousness: Alert SpO2: 97 % O2 Device: Room Air Assess: MEWS Score MEWS Temp: 0 MEWS Systolic: 0 MEWS Pulse: 2 MEWS RR: 0 MEWS LOC: 0 MEWS Score: 2 MEWS Score Color: Yellow Assess: SIRS CRITERIA SIRS Temperature : 0 SIRS Respirations : 0 SIRS Pulse: 1 SIRS WBC: 0 SIRS Score Sum : 1 SIRS Temperature : 0 SIRS Pulse: 1 SIRS Respirations : 0 SIRS WBC: 0 SIRS Score Sum : 1 Assess: if the MEWS score is Yellow or Red Were vital signs accurate and taken at a resting state?: Yes Does the patient meet 2 or more of the SIRS criteria?: No MEWS guidelines implemented : Yes, yellow Treat MEWS Interventions: Considered administering scheduled or prn medications/treatments as ordered Take Vital Signs Increase Vital Sign Frequency : Yellow: Q2hr x1, continue Q4hrs until patient remains green for 12hrs Escalate MEWS: Escalate: Yellow: Discuss with charge nurse and consider notifying provider and/or RRT         Orren Blades 05/09/2023, 4:53 PM

## 2023-05-09 NOTE — Inpatient Diabetes Management (Signed)
 Inpatient Diabetes Program Recommendations  AACE/ADA: New Consensus Statement on Inpatient Glycemic Control (2015)  Target Ranges:  Prepandial:   less than 140 mg/dL      Peak postprandial:   less than 180 mg/dL (1-2 hours)      Critically ill patients:  140 - 180 mg/dL   Lab Results  Component Value Date   GLUCAP 239 (H) 05/09/2023   HGBA1C 11.1 (A) 04/30/2023      Discharge Recommendations: Other recommendations: Continue: Ozempic  1 mg weekly, Metformin  1000 mg bid, Glipizide  10 mg bid at [facility] pharmacy listed in Home med rec Long acting recommendations: Insulin  Glargine (BASAGLAR) Kwikpen dose TBD at discharge  Supply/Referral recommendations: Pen needles - standard   Use Adult Diabetes Insulin  Treatment Post Discharge order set.   Thanks,  Eloise Hake RN, MSN, BC-ADM Inpatient Diabetes Coordinator Team Pager [phone] (8a-5p)

## 2023-05-09 NOTE — Progress Notes (Addendum)
 Pharmacy Antibiotic Note  Kayla Scott is a 48 y.o. female admitted on 05/08/2023 with Left otitis media/mastoiditis .  Pharmacy has been consulted for Vancomycin  dosing.  Plan: Vancomycin   2000 mg x1 then 1250 mg IV Q 12 hrs. Goal AUC 400-550. Expected AUC: 468.5 SCr used: 0.8, Vd 0.5 L/kg (BMI 49) Monitor clinical progress, renal function, cultures and vanc levels per protocol.   Height: 5\' 4"  (162.6 cm) Weight: 129.7 kg (286 lb) IBW/kg (Calculated) : 54.7  Temp (24hrs), Avg:98.3 F (36.8 C), Min:97.9 F (36.6 C), Max:98.6 F (37 C)  Recent Labs  Lab 05/08/23 0833 05/08/23 2323  WBC 10.8* 11.2*  CREATININE 0.60 0.64    Estimated Creatinine Clearance: 116.2 mL/min (by C-G formula based on SCr of 0.64 mg/dL).    No Known Allergies  Antimicrobials this admission: Vanc 4/25>> Unasyn  4/24>> CTX 4/25 x1   Dose adjustments this admission:   Microbiology results:  4/25 MRSA PCR: sent  Thank you for allowing pharmacy to be a part of this patient's care. Alisa Irish, RPh Clinical Pharmacist 05/09/2023 11:18 AM  ADDENDUM:    Pharmacy consulted to start Zosyn  for otitis, pseudomonas coverage.  Plan:  Add Zosyn  3.375 g IV q8hr (extended  4hr infusion of each dose) Monitor clinical status, renal function and culture results daily.    Alisa Irish, RPh Clinical Pharmacist 05/09/2023 2:40 PM

## 2023-05-09 NOTE — Consult Note (Signed)
 Reason for Consult:ear infection Referring Physician: Dr Ernestene Headings Kayla Scott is an 48 y.o. female.  HPI: hxof left ear infection since Tuesday. No previous. It started asd itching. She now is worse since yesterday being treated for otitis media. She has drainage from the left ear. No vertigo. She still has decent hearing. 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 Isabella Mao office.  Reportedly recommended surgery, but could not afford.   S/P C-section 10/18/2014   UTI (lower urinary tract infection)     Past Surgical History:  Procedure Laterality Date   CESAREAN SECTION N/A 11/09/1998   [place]   [facility]AREAN SECTION N/A 07/24/2001   [facility]  Premature delivery due to placenta previa   CESAREAN SECTION WITH BILATERAL TUBAL LIGATION Bilateral 10/18/2014   Procedure: REPEAT CESAREAN SECTION WITH BILATERAL TUBAL LIGATION;  Surgeon: Tresia Fruit, MD;  Location: [facility];  Service: Obstetrics;  Laterality: Bilateral;    Family History  Problem Relation Age of Onset   Diabetes Mother    Colitis Mother    Diabetes Sister    Diabetes Brother    Cancer Paternal Grandmother        uterine    Social History:  reports that she has never smoked. She has never been exposed to tobacco smoke. She has never used smokeless tobacco. She reports that she does not drink alcohol and does not use drugs.  Allergies: No Known Allergies  Medications: I have reviewed the patient's current medications.  Results for orders placed or performed during the hospital encounter of 05/08/23 (from the past 48 hours)  Basic metabolic panel     Status: Abnormal   Collection Time: 05/08/23  8:33 AM  Result Value Ref Range   Sodium 136 135 - 145 mmol/L   Potassium 3.7 3.5 -  5.1 mmol/L   Chloride 102 98 - 111 mmol/L   CO2 21 (L) 22 - 32 mmol/L   Glucose, Bld 319 (H) 70 - 99 mg/dL    Comment: Glucose reference range applies only to samples taken after fasting for at least 8 hours.   BUN 13 6 - 20 mg/dL   Creatinine, Ser 5.62 0.44 - 1.00 mg/dL   Calcium  9.2 8.9 - 10.3 mg/dL   GFR, Estimated >13 >08 mL/min    Comment: (NOTE) Calculated using the CKD-EPI Creatinine Equation (2021)    Anion gap 13 5 - 15    Comment: Performed at [facility], [address], [place], [place] [address]  CBC with Differential     Status: Abnormal   Collection Time: 05/08/23  8:33 AM  Result Value Ref Range   WBC 10.8 (H) 4.0 - 10.5 K/uL   RBC 4.21 3.87 - 5.11 MIL/uL   Hemoglobin 13.7 12.0 - 15.0 g/dL   HCT 69.6 29.5 - 28.4 %   MCV 96.0 80.0 - 100.0 fL   MCH 32.5 26.0 - 34.0 pg   MCHC 33.9 30.0 - 36.0 g/dL   RDW 13.2 44.0 - 10.2 %   Platelets 270 150 - 400 K/uL   nRBC 0.0 0.0 - 0.2 %   Neutrophils Relative % 70 %   Neutro Abs 7.6 1.7 - 7.7 K/uL   Lymphocytes Relative 21 %  Lymphs Abs 2.3 0.7 - 4.0 K/uL   Monocytes Relative 8 %   Monocytes Absolute 0.9 0.1 - 1.0 K/uL   Eosinophils Relative 1 %   Eosinophils Absolute 0.1 0.0 - 0.5 K/uL   Basophils Relative 0 %   Basophils Absolute 0.0 0.0 - 0.1 K/uL   Immature Granulocytes 0 %   Abs Immature Granulocytes 0.03 0.00 - 0.07 K/uL    Comment: Performed at [facility], [address], [place], [place] [address]  POC CBG, ED     Status: Abnormal   Collection Time: 05/08/23  4:44 PM  Result Value Ref Range   Glucose-Capillary 311 (H) 70 - 99 mg/dL    Comment: Glucose reference range applies only to samples taken after fasting for at least 8 hours.  Glucose, capillary     Status: Abnormal   Collection Time: 05/08/23  8:50 PM  Result Value Ref Range   Glucose-Capillary 280 (H) 70 - 99 mg/dL    Comment: Glucose reference range applies only to samples taken after fasting for at least 8 hours.  Comprehensive  metabolic panel     Status: Abnormal   Collection Time: 05/08/23 11:23 PM  Result Value Ref Range   Sodium 135 135 - 145 mmol/L   Potassium 3.8 3.5 - 5.1 mmol/L   Chloride 101 98 - 111 mmol/L   CO2 24 22 - 32 mmol/L   Glucose, Bld 274 (H) 70 - 99 mg/dL    Comment: Glucose reference range applies only to samples taken after fasting for at least 8 hours.   BUN 11 6 - 20 mg/dL   Creatinine, Ser 1.91 0.44 - 1.00 mg/dL   Calcium  8.7 (L) 8.9 - 10.3 mg/dL   Total Protein 6.3 (L) 6.5 - 8.1 g/dL   Albumin 2.4 (L) 3.5 - 5.0 g/dL   AST 15 15 - 41 U/L   ALT 26 0 - 44 U/L   Alkaline Phosphatase 68 38 - 126 U/L   Total Bilirubin 0.7 0.0 - 1.2 mg/dL   GFR, Estimated >47 >82 mL/min    Comment: (NOTE) Calculated using the CKD-EPI Creatinine Equation (2021)    Anion gap 10 5 - 15    Comment: Performed at [facility], [address], [place], [place] [address]  CBC     Status: Abnormal   Collection Time: 05/08/23 11:23 PM  Result Value Ref Range   WBC 11.2 (H) 4.0 - 10.5 K/uL   RBC 3.80 (L) 3.87 - 5.11 MIL/uL   Hemoglobin 12.4 12.0 - 15.0 g/dL   HCT 30.8 65.7 - 84.6 %   MCV 96.8 80.0 - 100.0 fL   MCH 32.6 26.0 - 34.0 pg   MCHC 33.7 30.0 - 36.0 g/dL   RDW 96.2 95.2 - 84.1 %   Platelets 246 150 - 400 K/uL   nRBC 0.0 0.0 - 0.2 %    Comment: Performed at [facility], [address] [place], [place] [address]  HIV Antibody (routine testing w rflx)     Status: None   Collection Time: 05/08/23 11:23 PM  Result Value Ref Range   HIV Screen 4th Generation wRfx Non Reactive Non Reactive    Comment: Performed at [facility], [address], [place], [place] [address]  Glucose, capillary     Status: Abnormal   Collection Time: 05/09/23  7:40 AM  Result Value Ref Range   Glucose-Capillary 202 (H) 70 - 99 mg/dL  Comment: Glucose reference range applies only to samples taken after fasting for at least 8 hours.  Glucose, capillary     Status: Abnormal   Collection Time: 05/09/23  11:27 AM  Result Value Ref Range   Glucose-Capillary 232 (H) 70 - 99 mg/dL    Comment: Glucose reference range applies only to samples taken after fasting for at least 8 hours.    CT Temporal Bones W Contrast Result Date: 05/08/2023 CLINICAL DATA:  Otitis media, ear pain and left facial pain. EXAM: CT TEMPORAL BONES WITH CONTRAST TECHNIQUE: Axial and coronal plane CT imaging of the petrous temporal bones was performed with thin-collimation image reconstruction following intravenous contrast administration. Multiplanar CT image reconstructions were also generated. RADIATION DOSE REDUCTION: This exam was performed according to the departmental dose-optimization program which includes automated exposure control, adjustment of the mA and/or kV according to patient size and/or use of iterative reconstruction technique. CONTRAST:  75mL OMNIPAQUE  IOHEXOL  350 MG/ML SOLN COMPARISON:  CT head 04/10/2004. FINDINGS: RIGHT TEMPORAL BONE External auditory canal: Normal. Middle ear cavity: Normally aerated. The scutum and ossicles are normal. The tegmen tympani is intact. Inner ear structures: The cochlea, vestibule and semicircular canals are normal. The vestibular aqueduct is not enlarged. Vestibular aqueduct also appears to communicate with the prominent right jugular bulb. Internal auditory and facial nerve canals:  Normal Mastoid air cells: The visualized mastoid air cells are normally aerated. Limited visualization of the right mastoid tip. No osseous erosion. LEFT TEMPORAL BONE External auditory canal: There is abnormal soft tissue extending into the osseous portion of the external auditory canal without evidence of nodular or masslike soft tissue. No osseous erosion. External auditory canal is otherwise unremarkable. Middle ear cavity: Abnormal soft tissue noted involving and nearly completely opacifying the hypo tympanum. Abnormal soft tissue also noted in the mesotympanum and epitympanum surrounding the ossicles.  The ossicles are normal in morphology without evidence of erosive change. The scutum is intact. Tegmen tympani is intact. Inner ear structures: The cochlea, vestibule and semicircular canals are normal. The vestibular aqueduct is not enlarged. Internal auditory and facial nerve canals: Osseous prominence at the porous acusticus resulting in mild narrowing of the medial internal auditory canal. The internal auditory canals otherwise unremarkable. Mastoid air cells: Moderate mastoid effusion most pronounced in the mastoid tip. No erosive change. Vascular: The carotid canals are unremarkable. Right jugular bulb is mildly prominent with thinning of the overlying sigmoid plate without dehiscence. Limited intracranial:  No acute or significant finding. Visible orbits/paranasal sinuses: No acute or significant finding in the orbits. Near complete opacification of the left maxillary sinus. Additional moderate mucosal thickening in the left ethmoid sinus and left sphenoid sinus with possible air-fluid level in the left sphenoid sinus. Soft tissues: Normal. IMPRESSION: Left tympanomastoid effusion suggestive of mastoiditis and otitis media. No findings to suggest coalescent mastoiditis. Fluid within the left middle ear cavity limits evaluation for small mass lesion. There is no erosive change of the ossicles. Thickening of the left tympanic membrane. Abnormal soft tissue involving the left external auditory canal which may be related to chronic inflammation. No masslike soft tissue or osseous erosion. Mild osseous narrowing of the medial left internal auditory canal. Prominent right jugular bulb with thinning of the overlying sigmoid plate without evidence of dehiscence. Paranasal sinus disease as above. Possible air-fluid level in the left sphenoid sinus. Recommend correlation acute sinusitis. Electronically Signed   By: Denny Flack M.D.   On: 05/08/2023 12:52    ROS Blood pressure 135/71,  pulse 98, temperature 98.1 F  (36.7 C), resp. rate 18, height 5\' 4"  (1.626 m), weight 129.7 kg, last menstrual period 05/02/2020, SpO2 94%. Physical Exam HENT:     Right Ear: External ear normal.     Ears:     Comments: Left ear full of liquid and has swelling. Cannot see TM. No granulation tissue seen. The ear hurts by pulling or moving the pinna. No swelling behind the earVII intact    Nose: Nose normal.     Mouth/Throat:     Mouth: Mucous membranes are moist.  Eyes:     Conjunctiva/sclera: Conjunctivae normal.     Pupils: Pupils are equal, round, and reactive to light.  Neurological:     Mental Status: She is alert.       Assessment/Plan: Left otitis externa/otitis media- she started with itching and hurts with the pinna movement. This is most consistent with OE. She has middle ear opacification on CT scan and very minimal mastoid effusion. No erosion or concern for complication. She needs to be covered for staph and pseudomonas. She needs to also be on Ciprodex  4-5 drops BID. No water in the ear.   Kayla Scott 05/09/2023, 12:03 PM

## 2023-05-10 LAB — COMPREHENSIVE METABOLIC PANEL WITH GFR
ALT: 21 U/L (ref 0–44)
AST: 15 U/L (ref 15–41)
Albumin: 2.2 g/dL — ABNORMAL LOW (ref 3.5–5.0)
Alkaline Phosphatase: 57 U/L (ref 38–126)
Anion gap: 12 (ref 5–15)
BUN: 8 mg/dL (ref 6–20)
CO2: 22 mmol/L (ref 22–32)
Calcium: 8.4 mg/dL — ABNORMAL LOW (ref 8.9–10.3)
Chloride: 99 mmol/L (ref 98–111)
Creatinine, Ser: 0.71 mg/dL (ref 0.44–1.00)
GFR, Estimated: >60 mL/min (ref >60)
Glucose, Bld: 228 mg/dL — ABNORMAL HIGH (ref 70–99)
Potassium: 3.3 mmol/L — ABNORMAL LOW (ref 3.5–5.1)
Sodium: 133 mmol/L — ABNORMAL LOW (ref 135–145)
Total Bilirubin: 1.2 mg/dL (ref 0.0–1.2)
Total Protein: 6 g/dL — ABNORMAL LOW (ref 6.5–8.1)

## 2023-05-10 LAB — MAGNESIUM: Magnesium: 1.3 mg/dL — ABNORMAL LOW (ref 1.7–2.4)

## 2023-05-10 LAB — CBC
HCT: 34.5 % — ABNORMAL LOW (ref 36.0–46.0)
Hemoglobin: 11.6 g/dL — ABNORMAL LOW (ref 12.0–15.0)
MCH: 32.4 pg (ref 26.0–34.0)
MCHC: 33.6 g/dL (ref 30.0–36.0)
MCV: 96.4 fL (ref 80.0–100.0)
Platelets: 267 10*3/uL (ref 150–400)
RBC: 3.58 MIL/uL — ABNORMAL LOW (ref 3.87–5.11)
RDW: 12.7 % (ref 11.5–15.5)
WBC: 7.6 10*3/uL (ref 4.0–10.5)
nRBC: 0 % (ref 0.0–0.2)

## 2023-05-10 LAB — GLUCOSE, CAPILLARY
Glucose-Capillary: 244 mg/dL — ABNORMAL HIGH (ref 70–99)
Glucose-Capillary: 224 mg/dL — ABNORMAL HIGH (ref 70–99)
Glucose-Capillary: 243 mg/dL — ABNORMAL HIGH (ref 70–99)
Glucose-Capillary: 220 mg/dL — ABNORMAL HIGH (ref 70–99)

## 2023-05-10 MED ORDER — SODIUM CHLORIDE 0.9 % IV SOLN: INTRAVENOUS | Status: DC

## 2023-05-10 MED ORDER — INSULIN GLARGINE-YFGN 100 UNIT/ML ~~LOC~~ SOLN
10.0000 [IU] | Freq: Every day | SUBCUTANEOUS | Status: DC
  Administered 2023-05-10 – 2023-05-11 (×2): 10 [IU] via SUBCUTANEOUS
  Filled 2023-05-10 (×2): qty 0.1

## 2023-05-10 MED ORDER — POTASSIUM CHLORIDE CRYS ER 20 MEQ PO TBCR
40.0000 meq | EXTENDED_RELEASE_TABLET | Freq: Once | ORAL | Status: AC
  Administered 2023-05-10: 40 meq via ORAL
  Filled 2023-05-10: qty 2

## 2023-05-10 MED ORDER — MAGNESIUM SULFATE 4 GM/100ML IV SOLN
4.0000 g | Freq: Once | INTRAVENOUS | Status: AC
  Administered 2023-05-10: 4 g via INTRAVENOUS
  Filled 2023-05-10: qty 100

## 2023-05-10 MED ORDER — VANCOMYCIN HCL 1.25 G IV SOLR
1250.0000 mg | Freq: Two times a day (BID) | INTRAVENOUS | Status: DC
  Administered 2023-05-10: 1250 mg via INTRAVENOUS
  Filled 2023-05-10: qty 25

## 2023-05-10 MED ORDER — INSULIN GLARGINE-YFGN 100 UNIT/ML ~~LOC~~ SOPN
10.0000 [IU] | PEN_INJECTOR | SUBCUTANEOUS | Status: DC
  Filled 2023-05-10: qty 3

## 2023-05-10 NOTE — Progress Notes (Addendum)
 PROGRESS NOTE  Kayla Scott ZOX:[id number] DOB: 06-01-1975 DOA: 05/08/2023 PCP: Collins Dean, NP   LOS: 1 day   Brief Narrative / Interim history: 48 year old female with DM2, HTN, HLD, morbid obesity who comes into the hospital for left ear pain, this has been going on for the past 4 to 5 days, not getting any better.  She was prescribed amoxicillin  as an outpatient, took it for a day but given worsening of her pain came to the ER.  She was placed on antibiotics and admitted to the hospital  Subjective / 24h Interval events: Overall feels improved.  She feels like she can open her mouth more and can eat better.  Has less pain  Assesement and Plan: Principal problem Left otitis media/mastoiditis -patient initially admitted and placed on Unasyn , however initially got worse.  ENT as well as ID consulted.  Her antibiotics were broadened to have staph as well as Pseudomonas coverage, currently on vancomycin  and Zosyn , and improving - Discussed with Dr. Levern Reader with ID, continue IV antibiotics for at least another day.  Active problems Essential hypertension-continue to hold lisinopril  given normal blood pressures  Hypomagnesemia, hypokalemia-replace electrolytes and continue to monitor  Hyperlipidemia-continue statin  Obesity, class III-BMI 49.  She would benefit from weight loss  DM2, poorly controlled, with hyperglycemia -continue sliding scale.  May benefit from long-acting insulin , Basaglar would be covered at her pharmacy.  Due to persistent hyperglycemia in the morning, start glargine 10 units this morning  CBG (last 3)  Recent Labs    05/09/23 1627 05/09/23 2158 05/10/23 0830  GLUCAP 171* 265* 244*    Scheduled Meds:  atorvastatin   20 mg Oral Daily   ciprofloxacin -dexamethasone   4 drop Left EAR BID   enoxaparin  (LOVENOX ) injection  70 mg Subcutaneous Q24H   insulin  aspart  0-15 Units Subcutaneous TID WC   insulin  aspart  0-5 Units Subcutaneous QHS    insulin  glargine-yfgn  10 Units Subcutaneous Q24H   Continuous Infusions:  piperacillin -tazobactam (ZOSYN )  IV 3.375 g (05/10/23 0539)   Vancomycin  (VANCOCIN ) 1,250 mg in sodium chloride  0.9 % 250 mL IVPB     PRN Meds:.bisacodyl , HYDROmorphone  (DILAUDID ) injection, ondansetron  **OR** ondansetron  (ZOFRAN ) IV, oxyCODONE   Current Outpatient Medications  Medication Instructions   Advil  200-400 mg, Oral, Every 6 hours PRN   AgaMatrix Ultra-Thin Lancets MISC Check blood glucose twice daily before meals   amoxicillin  (AMOXIL ) 500 mg, Oral, 3 times daily   atorvastatin  (LIPITOR) 20 mg, Oral, Daily   Blood Glucose Monitoring Suppl (AGAMATRIX PRESTO) w/Device KIT Check blood glucose before meals twice daily   Blood Glucose Monitoring Suppl (TRUE METRIX METER) w/Device KIT Use as instructed. Check blood glucose level by fingerstick twice per day.  E11.65   fluconazole  (DIFLUCAN ) 150 MG tablet For 1 week, take 150 mg (1 tablet) PO every 72 hours for 3 doses (day 1, 4, and 7). Then take 1 tablet (150 mg) once a week for 3 months until gone.   fluticasone  (FLONASE ) 50 MCG/ACT nasal spray 2 sprays, Each Nare, Daily   glipiZIDE  (GLUCOTROL ) 10 mg, Oral, 2 times daily before meals   glucose blood (AGAMATRIX PRESTO TEST) test strip Check blood glucose before meals twice daily   glucose blood (TRUE METRIX BLOOD GLUCOSE TEST) test strip Use as instructed. Check blood glucose level by fingerstick twice per day.  E11.65   lidocaine  (LIDODERM ) 5 % 1 patch, Transdermal, Every 24 hours, Remove & Discard patch within 12 hours or as directed by MD  lisinopril  (ZESTRIL ) 10 mg, Oral, Daily   loratadine  (CLARITIN ) 10 mg, Oral, Daily   meloxicam  (MOBIC ) 15 MG tablet Take 1 tablet (15 mg total) by mouth daily with a meal for 2 weeks, then once every 24 hours prn pain.   metFORMIN  (GLUCOPHAGE -XR) 1,000 mg, Oral, 2 times daily with meals   methocarbamol  (ROBAXIN ) 750 mg, Oral, Every 8 hours PRN   Ozempic  (1 MG/DOSE) 1 mg,  Injection, Weekly   triamcinolone  cream (KENALOG ) 0.1 % Apply twice daily to affected area   TRUEplus Lancets 28G MISC Use as instructed. Check blood glucose level by fingerstick twice per day.  E11.65    Diet Orders (From admission, onward)     Start     Ordered   05/09/23 1614  Diet Carb Modified Fluid consistency: Thin; Room service appropriate? Yes  Diet effective now       Question Answer Comment  Diet-HS Snack? Nothing   Calorie Level Medium 1600-2000   Fluid consistency: Thin   Room service appropriate? Yes      05/09/23 1613            DVT prophylaxis:    Lab Results  Component Value Date   PLT 267 05/10/2023      Code Status: Full Code  Family Communication: Husband at bedside  Status is: Inpatient  Level of care: Med-Surg  Consultants:  ENT ID  Objective: Vitals:   05/09/23 1627 05/09/23 2208 05/10/23 0500 05/10/23 0837  BP: 119/63 126/76 115/74 99/66  Pulse: (!) 116 (!) 119 (!) 107 (!) 101  Resp: 18 18 16 18   Temp: 98.3 F (36.8 C) 99.3 F (37.4 C) 99.1 F (37.3 C) 98.6 F (37 C)  TempSrc:  Oral Oral   SpO2: 97% 98% 98% 91%  Weight:      Height:        Intake/Output Summary (Last 24 hours) at 05/10/2023 1125 Last data filed at 05/10/2023 0837 Gross per 24 hour  Intake 594.27 ml  Output 0 ml  Net 594.27 ml   Wt Readings from Last 3 Encounters:  05/08/23 129.7 kg  05/07/23 129.7 kg  04/30/23 126.6 kg    Examination: Constitutional: NAD Eyes: lids and conjunctivae normal, no scleral icterus ENMT: mmm Neck: normal, supple Respiratory: clear to auscultation bilaterally, no wheezing, no crackles. Normal respiratory effort.  Cardiovascular: Regular rate and rhythm, no murmurs / rubs / gallops. No LE edema. Abdomen: soft, no distention, no tenderness. Bowel sounds positive.   Data Reviewed: I have independently reviewed following labs and imaging studies   CBC Recent Labs  Lab 05/08/23 0833 05/08/23 2323 05/10/23 0538  WBC  10.8* 11.2* 7.6  HGB 13.7 12.4 11.6*  HCT 40.4 36.8 34.5*  PLT 270 246 267  MCV 96.0 96.8 96.4  MCH 32.5 32.6 32.4  MCHC 33.9 33.7 33.6  RDW 12.8 13.2 12.7  LYMPHSABS 2.3  --   --   MONOABS 0.9  --   --   EOSABS 0.1  --   --   BASOSABS 0.0  --   --     Recent Labs  Lab 05/08/23 0833 05/08/23 2323 05/10/23 0538  NA 136 135 133*  K 3.7 3.8 3.3*  CL 102 101 99  CO2 21* 24 22  GLUCOSE 319* 274* 228*  BUN 13 11 8   CREATININE 0.60 0.64 0.71  CALCIUM  9.2 8.7* 8.4*  AST  --  15 15  ALT  --  26 21  ALKPHOS  --  68  57  BILITOT  --  0.7 1.2  ALBUMIN  --  2.4* 2.2*  MG  --   --  1.3*    ------------------------------------------------------------------------------------------------------------------ No results for input(s): "CHOL", "HDL", "LDLCALC", "TRIG", "CHOLHDL", "LDLDIRECT" in the last 72 hours.  Lab Results  Component Value Date   HGBA1C 11.1 (A) 04/30/2023   ------------------------------------------------------------------------------------------------------------------ No results for input(s): "TSH", "T4TOTAL", "T3FREE", "THYROIDAB" in the last 72 hours.  Invalid input(s): "FREET3"  Cardiac Enzymes No results for input(s): "CKMB", "TROPONINI", "MYOGLOBIN" in the last 168 hours.  Invalid input(s): "CK" ------------------------------------------------------------------------------------------------------------------ No results found for: "BNP"  CBG: Recent Labs  Lab 05/09/23 1127 05/09/23 1233 05/09/23 1627 05/09/23 2158 05/10/23 0830  GLUCAP 232* 239* 171* 265* 244*    Recent Results (from the past 240 hours)  MRSA Next Gen by PCR, Nasal     Status: None   Collection Time: 05/09/23  9:57 AM   Specimen: Nasal Mucosa; Nasal Swab  Result Value Ref Range Status   MRSA by PCR Next Gen NOT DETECTED NOT DETECTED Final    Comment: (NOTE) The GeneXpert MRSA Assay (FDA approved for NASAL specimens only), is one component of a comprehensive MRSA colonization  surveillance program. It is not intended to diagnose MRSA infection nor to guide or monitor treatment for MRSA infections. Test performance is not FDA approved in patients less than 43 years old. Performed at [facility], [address] [place], [place] [address]      Radiology Studies: No results found.   Kathlen Para, MD, PhD [facility]  Between 7 am - 7 pm I am available, please contact me via Amion (for emergencies) or Securechat (non urgent messages)  Between 7 pm - 7 am I am not available, please contact night coverage MD/APP via Amion

## 2023-05-10 NOTE — Progress Notes (Signed)
 K 3.3 and mg 1.3 this AM. Ok to replace with KCL 40meq x1 and Mag 4g IV x1 per Dr. Aldona Amel.  Ivery Marking, PharmD, BCIDP, AAHIVP, CPP Infectious Disease Pharmacist 05/10/2023 8:22 AM

## 2023-05-10 NOTE — Progress Notes (Signed)
    [facility]    Date of Admission:  05/08/2023   Total days of antibiotics 3   ID: Kayla Scott is a 48 y.o. female with  poorly controlled T2DM with OM/OE +/- mastoiditis Principal Problem:   Mastoiditis Active Problems:   Otitis media    Subjective: Symptoms slightly improved, less tender. Ear drops helping  Medications:   atorvastatin   20 mg Oral Daily   ciprofloxacin -dexamethasone   4 drop Left EAR BID   enoxaparin  (LOVENOX ) injection  70 mg Subcutaneous Q24H   insulin  aspart  0-15 Units Subcutaneous TID WC   insulin  aspart  0-5 Units Subcutaneous QHS   insulin  glargine-yfgn  10 Units Subcutaneous Daily    Objective: Vital signs in last 24 hours: Temp:  [98.3 F (36.8 C)-99.3 F (37.4 C)] 98.6 F (37 C) (04/26 0837) Pulse Rate:  [101-119] 101 (04/26 0837) Resp:  [16-18] 18 (04/26 0837) BP: (99-126)/(63-76) 99/66 (04/26 0837) SpO2:  [91 %-98 %] 91 % (04/26 0837) Physical Exam  Constitutional:  oriented to person, place, and time. appears well-developed and well-nourished. No distress.  HENT: [place]/AT, PERRLA, no scleral icterus Mouth/Throat: Oropharynx is clear and moist. No oropharyngeal exudate. Left ear drainage Cardiovascular: Normal rate, regular rhythm and normal heart sounds. Exam reveals no gallop and no friction rub.  No murmur heard.  Pulmonary/Chest: Effort normal and breath sounds normal. No respiratory distress.  has no wheezes.  Neck = supple, no nuchal rigidity Abdominal: Soft. Bowel sounds are normal.  exhibits no distension. There is no tenderness.  Lymphadenopathy: no cervical adenopathy. No axillary adenopathy Neurological: alert and oriented to person, place, and time.  Skin: Skin is warm and dry. No rash noted. No erythema.  Psychiatric: a normal mood and affect.  behavior is normal.    Lab Results Recent Labs    05/08/23 2323 05/10/23 0538  WBC 11.2* 7.6  HGB 12.4 11.6*  HCT 36.8 34.5*  NA 135  133*  K 3.8 3.3*  CL 101 99  CO2 24 22  BUN 11 8  CREATININE 0.64 0.71   Liver Panel Recent Labs    05/08/23 2323 05/10/23 0538  PROT 6.3* 6.0*  ALBUMIN 2.4* 2.2*  AST 15 15  ALT 26 21  ALKPHOS 68 57  BILITOT 0.7 1.2   Sedimentation Rate No results for input(s): "ESRSEDRATE" in the last 72 hours. C-Reactive Protein No results for input(s): "CRP" in the last 72 hours.  Microbiology:  Studies/Results: No results found.   Assessment/Plan: Left ear OE/OM +/- mastoiditis = can transition to amox/clav 875mg  bid plus levofoxacin 750mg  daily for 7 days. Appears improving as her leukocytosis has improved  Continue with topical cipro /dex drops - please give the hospital bottle to her (generally very expensive as outpatient $$ med)  Can use ibuprofen  at discharge for ear pain  She is not MRSA colonized, so we will d/c vancomycin ; continue with standard precautions  Will sign off.  evaluation of this patient requires complex antimicrobial therapy evaluation and counseling and isolation needs for disease transmission risk assessment and mitigation.    [facility] for Infectious Diseases Pager: [phone]  05/10/2023, 1:49 PM

## 2023-05-11 LAB — CBC
HCT: 33 % — ABNORMAL LOW (ref 36.0–46.0)
Hemoglobin: 11.2 g/dL — ABNORMAL LOW (ref 12.0–15.0)
MCH: 32.4 pg (ref 26.0–34.0)
MCHC: 33.9 g/dL (ref 30.0–36.0)
MCV: 95.4 fL (ref 80.0–100.0)
Platelets: 273 10*3/uL (ref 150–400)
RBC: 3.46 MIL/uL — ABNORMAL LOW (ref 3.87–5.11)
RDW: 12.6 % (ref 11.5–15.5)
WBC: 6.2 10*3/uL (ref 4.0–10.5)
nRBC: 0 % (ref 0.0–0.2)

## 2023-05-11 LAB — BASIC METABOLIC PANEL WITH GFR
Anion gap: 13 (ref 5–15)
BUN: 5 mg/dL — ABNORMAL LOW (ref 6–20)
CO2: 22 mmol/L (ref 22–32)
Calcium: 8 mg/dL — ABNORMAL LOW (ref 8.9–10.3)
Chloride: 97 mmol/L — ABNORMAL LOW (ref 98–111)
Creatinine, Ser: 0.61 mg/dL (ref 0.44–1.00)
GFR, Estimated: >60 mL/min (ref >60)
Glucose, Bld: 218 mg/dL — ABNORMAL HIGH (ref 70–99)
Potassium: 3.3 mmol/L — ABNORMAL LOW (ref 3.5–5.1)
Sodium: 132 mmol/L — ABNORMAL LOW (ref 135–145)

## 2023-05-11 LAB — MAGNESIUM: Magnesium: 1.5 mg/dL — ABNORMAL LOW (ref 1.7–2.4)

## 2023-05-11 LAB — GLUCOSE, CAPILLARY: Glucose-Capillary: 200 mg/dL — ABNORMAL HIGH (ref 70–99)

## 2023-05-11 MED ORDER — METFORMIN HCL ER 500 MG PO TB24: 500.0000 mg | ORAL_TABLET | Freq: Two times a day (BID) | ORAL | 1 refills | Status: DC

## 2023-05-11 MED ORDER — LACTINEX PO CHEW: 1.0000 | CHEWABLE_TABLET | Freq: Three times a day (TID) | ORAL | 0 refills | Status: AC

## 2023-05-11 MED ORDER — LORATADINE 10 MG PO TABS: 10.0000 mg | ORAL_TABLET | Freq: Every day | ORAL | 1 refills | Status: DC

## 2023-05-11 MED ORDER — CIPROFLOXACIN-DEXAMETHASONE 0.3-0.1 % OT SUSP: 4.0000 [drp] | Freq: Two times a day (BID) | OTIC | 0 refills | Status: DC

## 2023-05-11 MED ORDER — AMOXICILLIN-POT CLAVULANATE 875-125 MG PO TABS
1.0000 | ORAL_TABLET | Freq: Two times a day (BID) | ORAL | Status: DC
  Administered 2023-05-11: 1 via ORAL
  Filled 2023-05-11: qty 1

## 2023-05-11 MED ORDER — LEVOFLOXACIN 750 MG PO TABS: 750.0000 mg | ORAL_TABLET | Freq: Every day | ORAL | 0 refills | Status: DC

## 2023-05-11 MED ORDER — INSULIN GLARGINE-YFGN 100 UNIT/ML ~~LOC~~ SOLN: 10.0000 [IU] | Freq: Every day | SUBCUTANEOUS | 11 refills | Status: DC

## 2023-05-11 MED ORDER — FLUCONAZOLE 150 MG PO TABS: ORAL_TABLET | ORAL | 0 refills | Status: DC

## 2023-05-11 MED ORDER — LEVOFLOXACIN 750 MG PO TABS
750.0000 mg | ORAL_TABLET | Freq: Every day | ORAL | Status: DC
  Administered 2023-05-11: 750 mg via ORAL
  Filled 2023-05-11: qty 1

## 2023-05-11 MED ORDER — AMOXICILLIN-POT CLAVULANATE 875-125 MG PO TABS: 1.0000 | ORAL_TABLET | Freq: Two times a day (BID) | ORAL | 0 refills | Status: AC

## 2023-05-11 MED ORDER — POTASSIUM CHLORIDE CRYS ER 20 MEQ PO TBCR
40.0000 meq | EXTENDED_RELEASE_TABLET | Freq: Once | ORAL | Status: AC
  Administered 2023-05-11: 40 meq via ORAL
  Filled 2023-05-11: qty 2

## 2023-05-11 MED ORDER — MAGNESIUM SULFATE 4 GM/100ML IV SOLN
4.0000 g | Freq: Once | INTRAVENOUS | Status: AC
  Administered 2023-05-11: 4 g via INTRAVENOUS
  Filled 2023-05-11: qty 100

## 2023-05-11 MED ORDER — ADVIL 200 MG PO CAPS: 400.0000 mg | ORAL_CAPSULE | Freq: Four times a day (QID) | ORAL | 0 refills | Status: AC | PRN

## 2023-05-11 NOTE — Progress Notes (Signed)
 Transition zosyn  to Augmentin /levofloxacin x 7d per ID's note.  Ivery Marking, PharmD, BCIDP, AAHIVP, CPP Infectious Disease Pharmacist 05/11/2023 8:09 AM

## 2023-05-11 NOTE — Discharge Summary (Signed)
 Physician Discharge Summary   Patient: Kayla Scott MRN: [id number] DOB: 16-May-1975  Admit date:     05/08/2023  Discharge date: 05/11/23  Discharge Physician: Bobbetta Burnet   PCP: Collins Dean, NP   Recommendations at discharge:   Follow-up with PCP in 1-2 weeks Follow-up with the ENT in 1 week Continue current recommended antibiotics   Brief Narrative / Interim history: Kayla Scott is a  48 year old female with DM2, HTN, HLD, morbid obesity who comes into the hospital for left ear pain, this has been going on for the past 4 to 5 days, not getting any better.  She was prescribed amoxicillin  as an outpatient, took it for a day but given worsening of her pain came to the ER.  She was placed on antibiotics and admitted to the hospital     Left otitis media/mastoiditis -patient initially admitted and placed on Unasyn , however initially got worse.   ENT as well as ID consulted.   Her antibiotics were broadened to have staph as well as Pseudomonas coverage, currently on vancomycin  and Zosyn , and improving Discussed with Dr. Levern Reader with ID, continue IV antibiotics till 4/27 then . transition to Amox/clav 875mg  bid plus levofoxacin 750mg  daily for 7 days.     Continue with topical cipro /dex drops   Can use ibuprofen  at discharge for ear pain   She is not MRSA colonized,    Active problems Essential hypertension-BP running soft, continue to hold lisinopril    Hypomagnesemia, hypokalemia was replaced   Hyperlipidemia-continue statin   Obesity, class III-BMI 49.  She would benefit from weight loss   DM2, poorly controlled, with hyperglycemia  CBG (last 3)  Recent Labs    05/10/23 1618 05/10/23 2301 05/11/23 0751  GLUCAP 243* 220* 200*   Last A1c 11 days ago 11.1 -Continue home medication of Glucotrol , metformin  reduced to 500 mg p.o. twice daily -Long-acting insulin  -  start glargine 10 units this morning - Strict diabetic carb modified  diet - Strict blood sugar monitoring recommended   Consultants: ID / ENT  Procedures performed: None   Disposition: Home Diet recommendation:  Discharge Diet Orders (From admission, onward)     Start     Ordered   05/11/23 0000  Diet - low sodium heart healthy        05/11/23 0944           Carb modified diet DISCHARGE MEDICATION: Allergies as of 05/11/2023   No Known Allergies      Medication List     STOP taking these medications    amoxicillin  500 MG capsule Commonly known as: AMOXIL    fluticasone  50 MCG/ACT nasal spray Commonly known as: FLONASE    lidocaine  5 % Commonly known as: Lidoderm    lisinopril  10 MG tablet Commonly known as: ZESTRIL    methocarbamol  750 MG tablet Commonly known as: ROBAXIN    triamcinolone  cream 0.1 % Commonly known as: KENALOG        TAKE these medications    Advil  200 MG Caps Generic drug: Ibuprofen  Take 2 capsules (400 mg total) by mouth every 6 (six) hours as needed (for pain). What changed: how much to take   AgaMatrix Presto Test test strip Generic drug: glucose blood Check blood glucose before meals twice daily   True Metrix Blood Glucose Test test strip Generic drug: glucose blood Use as instructed. Check blood glucose level by fingerstick twice per day.  E11.65   AgaMatrix Presto w/Device Kit Check blood glucose before meals twice daily  True Metrix Meter w/Device Kit Use as instructed. Check blood glucose level by fingerstick twice per day.  E11.65   AgaMatrix Ultra-Thin Lancets Misc Check blood glucose twice daily before meals   TRUEplus Lancets 28G Misc Use as instructed. Check blood glucose level by fingerstick twice per day.  E11.65   amoxicillin -clavulanate 875-125 MG tablet Commonly known as: AUGMENTIN  Take 1 tablet by mouth every 12 (twelve) hours for 7 days.   atorvastatin  20 MG tablet Commonly known as: LIPITOR Tome 1 tableta (20 mg en total) por va oral diariamente. (Take 1 tablet (20  mg total) by mouth daily.)   ciprofloxacin -dexamethasone  OTIC suspension Commonly known as: CIPRODEX  Place 4 drops into the left ear 2 (two) times daily.   fluconazole  150 MG tablet Commonly known as: DIFLUCAN  For 1 week, take 150 mg (1 tablet) PO every 72 hours for 3 doses (day 1, 4, and 7). Then take 1 tablet (150 mg) once a week for 3 months until gone. What changed: Another medication with the same name was removed. Continue taking this medication, and follow the directions you see here.   glipiZIDE  10 MG tablet Commonly known as: GLUCOTROL  Take 1 tablet (10 mg total) by mouth 2 (two) times daily before a meal.   insulin  glargine-yfgn 100 UNIT/ML injection Commonly known as: SEMGLEE Inject 0.1 mLs (10 Units total) into the skin daily. Start taking on: May 12, 2023   lactobacillus acidophilus & bulgar chewable tablet Chew 1 tablet by mouth 3 (three) times daily with meals for 7 days.   levofloxacin 750 MG tablet Commonly known as: LEVAQUIN Take 1 tablet (750 mg total) by mouth daily for 7 days. Start taking on: May 12, 2023   loratadine  10 MG tablet Commonly known as: CLARITIN  Take 1 tablet (10 mg total) by mouth daily. What changed:  when to take this reasons to take this   meloxicam  15 MG tablet Commonly known as: MOBIC  Take 1 tablet (15 mg total) by mouth daily with a meal for 2 weeks, then once every 24 hours prn pain. What changed:  when to take this reasons to take this   metFORMIN  500 MG 24 hr tablet Commonly known as: GLUCOPHAGE -XR Take 1 tablet (500 mg total) by mouth 2 (two) times daily with a meal. What changed: how much to take   Ozempic  (1 MG/DOSE) 4 MG/3ML Sopn Generic drug: Semaglutide  (1 MG/DOSE) Inject 1 mg as directed once a week.        Discharge Exam: Filed Weights   05/08/23 0828  Weight: 129.7 kg        General:  AAO x 3,  cooperative, no distress;   HEENT:  Left earlobe tenderness, with Perry earlobe edema with proving  erythema, minimal drainage  Otherwise -normocephalic, PERRL, all with in  Normal limits   Neuro:  CNII-XII intact. , normal motor and sensation, reflexes intact   Lungs:   Clear to auscultation BL, Respirations unlabored,  No wheezes / crackles  Cardio:    S1/S2, RRR, No murmure, No Rubs or Gallops   Abdomen:  Soft, non-tender, bowel sounds active all four quadrants, no guarding or peritoneal signs.  Muscular  skeletal:  Limited exam -global generalized weaknesses - in bed, able to move all 4 extremities,   2+ pulses,  symmetric, No pitting edema  Skin:  Dry, warm to touch, negative for any Rashes,  Wounds: Please see nursing documentation          Condition at discharge: fair  The  results of significant diagnostics from this hospitalization (including imaging, microbiology, ancillary and laboratory) are listed below for reference.   Imaging Studies: CT Temporal Bones W Contrast Result Date: 05/08/2023 CLINICAL DATA:  Otitis media, ear pain and left facial pain. EXAM: CT TEMPORAL BONES WITH CONTRAST TECHNIQUE: Axial and coronal plane CT imaging of the petrous temporal bones was performed with thin-collimation image reconstruction following intravenous contrast administration. Multiplanar CT image reconstructions were also generated. RADIATION DOSE REDUCTION: This exam was performed according to the departmental dose-optimization program which includes automated exposure control, adjustment of the mA and/or kV according to patient size and/or use of iterative reconstruction technique. CONTRAST:  75mL OMNIPAQUE  IOHEXOL  350 MG/ML SOLN COMPARISON:  CT head 04/10/2004. FINDINGS: RIGHT TEMPORAL BONE External auditory canal: Normal. Middle ear cavity: Normally aerated. The scutum and ossicles are normal. The tegmen tympani is intact. Inner ear structures: The cochlea, vestibule and semicircular canals are normal. The vestibular aqueduct is not enlarged. Vestibular aqueduct also appears to  communicate with the prominent right jugular bulb. Internal auditory and facial nerve canals:  Normal Mastoid air cells: The visualized mastoid air cells are normally aerated. Limited visualization of the right mastoid tip. No osseous erosion. LEFT TEMPORAL BONE External auditory canal: There is abnormal soft tissue extending into the osseous portion of the external auditory canal without evidence of nodular or masslike soft tissue. No osseous erosion. External auditory canal is otherwise unremarkable. Middle ear cavity: Abnormal soft tissue noted involving and nearly completely opacifying the hypo tympanum. Abnormal soft tissue also noted in the mesotympanum and epitympanum surrounding the ossicles. The ossicles are normal in morphology without evidence of erosive change. The scutum is intact. Tegmen tympani is intact. Inner ear structures: The cochlea, vestibule and semicircular canals are normal. The vestibular aqueduct is not enlarged. Internal auditory and facial nerve canals: Osseous prominence at the porous acusticus resulting in mild narrowing of the medial internal auditory canal. The internal auditory canals otherwise unremarkable. Mastoid air cells: Moderate mastoid effusion most pronounced in the mastoid tip. No erosive change. Vascular: The carotid canals are unremarkable. Right jugular bulb is mildly prominent with thinning of the overlying sigmoid plate without dehiscence. Limited intracranial:  No acute or significant finding. Visible orbits/paranasal sinuses: No acute or significant finding in the orbits. Near complete opacification of the left maxillary sinus. Additional moderate mucosal thickening in the left ethmoid sinus and left sphenoid sinus with possible air-fluid level in the left sphenoid sinus. Soft tissues: Normal. IMPRESSION: Left tympanomastoid effusion suggestive of mastoiditis and otitis media. No findings to suggest coalescent mastoiditis. Fluid within the left middle ear cavity  limits evaluation for small mass lesion. There is no erosive change of the ossicles. Thickening of the left tympanic membrane. Abnormal soft tissue involving the left external auditory canal which may be related to chronic inflammation. No masslike soft tissue or osseous erosion. Mild osseous narrowing of the medial left internal auditory canal. Prominent right jugular bulb with thinning of the overlying sigmoid plate without evidence of dehiscence. Paranasal sinus disease as above. Possible air-fluid level in the left sphenoid sinus. Recommend correlation acute sinusitis. Electronically Signed   By: Denny Flack M.D.   On: 05/08/2023 12:52    Microbiology: Results for orders placed or performed during the hospital encounter of 05/08/23  MRSA Next Gen by PCR, Nasal     Status: None   Collection Time: 05/09/23  9:57 AM   Specimen: Nasal Mucosa; Nasal Swab  Result Value Ref Range Status   MRSA  by PCR Next Gen NOT DETECTED NOT DETECTED Final    Comment: (NOTE) The GeneXpert MRSA Assay (FDA approved for NASAL specimens only), is one component of a comprehensive MRSA colonization surveillance program. It is not intended to diagnose MRSA infection nor to guide or monitor treatment for MRSA infections. Test performance is not FDA approved in patients less than 37 years old. Performed at [facility], [address] [place], [place] [address]     Labs: CBC: Recent Labs  Lab 05/08/23 [phone] 05/08/23 2323 05/10/23 0538 05/11/23 0739  WBC 10.8* 11.2* 7.6 6.2  NEUTROABS 7.6  --   --   --   HGB 13.7 12.4 11.6* 11.2*  HCT 40.4 36.8 34.5* 33.0*  MCV 96.0 96.8 96.4 95.4  PLT 270 246 267 273   Basic Metabolic Panel: Recent Labs  Lab 05/08/23 0833 05/08/23 2323 05/10/23 0538 05/11/23 0739  NA 136 135 133* 132*  K 3.7 3.8 3.3* 3.3*  CL 102 101 99 97*  CO2 21* 24 22 22   GLUCOSE 319* 274* 228* 218*  BUN 13 11 8  5*  CREATININE 0.60 0.64 0.71 0.61  CALCIUM  9.2 8.7* 8.4* 8.0*  MG  --    --  1.3* 1.5*   Liver Function Tests: Recent Labs  Lab 05/08/23 2323 05/10/23 0538  AST 15 15  ALT 26 21  ALKPHOS 68 57  BILITOT 0.7 1.2  PROT 6.3* 6.0*  ALBUMIN 2.4* 2.2*   CBG: Recent Labs  Lab 05/10/23 0830 05/10/23 1219 05/10/23 1618 05/10/23 2301 05/11/23 0751  GLUCAP 244* 224* 243* 220* 200*    Discharge time spent: greater than 45 minutes.  Signed: Bobbetta Burnet, MD [facility] 05/11/2023

## 2023-05-12 ENCOUNTER — Ambulatory Visit: Payer: Self-pay | Admitting: Sports Medicine

## 2023-05-12 ENCOUNTER — Telehealth: Payer: Self-pay

## 2023-05-12 ENCOUNTER — Encounter: Payer: Self-pay | Admitting: Sports Medicine

## 2023-05-12 DIAGNOSIS — G8929 Other chronic pain: Secondary | ICD-10-CM

## 2023-05-12 DIAGNOSIS — M25561 Pain in right knee: Secondary | ICD-10-CM

## 2023-05-12 MED ORDER — TRAMADOL HCL 50 MG PO TABS: 50.0000 mg | ORAL_TABLET | Freq: Three times a day (TID) | ORAL | 0 refills | Status: DC | PRN

## 2023-05-12 MED ORDER — DICLOFENAC SODIUM 1 % EX GEL
4.0000 g | Freq: Four times a day (QID) | CUTANEOUS | 11 refills | Status: DC
Start: 2023-05-12 — End: 2023-11-20

## 2023-05-12 NOTE — Progress Notes (Signed)
    Procedures performed today:    None.  Independent interpretation of notes and tests performed by another provider:   None.  Brief History, Exam, Impression, and Recommendations:    Chronic pain of right knee This is a very pleasant 48 year old female, known severe knee osteoarthritis, obesity and diabetes She had gone to the knee treatment centers in [place] and had several injections, per her report they were a series of injections so I suspect they were viscosupplementation, she did not notice any improvement. She has not had a steroid injection per her report. We started conservatively with meloxicam , we got an MRI due to a McMurray's sign. Ultimately the MRI showed severe osteoarthritis and meniscal tearing. She had some therapy, she was improving but in the interim was hospitalized with mastoiditis. Recently discharged. She continues to have some pain however her A1c 12 days ago was 11. I advised her we really should not do a steroid injection until her A1c is better controlled. We will add tramadol and Voltaren gel, continue meloxicam , she can return to see me when her A1c is under better control and we will consider the steroid injection since viscosupplementation was not effective. Geniculate artery embolization may be an option in the future.    ____________________________________________ Joselyn Nicely. Sandy Crumb, M.D., ABFM., CAQSM., AME. Primary Care and Sports Medicine [facility]  Adjunct Professor of [facility] Medicine  [employer]  Restaurant manager, fast food

## 2023-05-12 NOTE — Telephone Encounter (Signed)
 Pt returned the missed call,   Best contact: [phone]

## 2023-05-12 NOTE — Assessment & Plan Note (Signed)
 This is a very pleasant 48 year old female, known severe knee osteoarthritis, obesity and diabetes She had gone to the knee treatment centers in [place] and had several injections, per her report they were a series of injections so I suspect they were viscosupplementation, she did not notice any improvement. She has not had a steroid injection per her report. We started conservatively with meloxicam , we got an MRI due to a McMurray's sign. Ultimately the MRI showed severe osteoarthritis and meniscal tearing. She had some therapy, she was improving but in the interim was hospitalized with mastoiditis. Recently discharged. She continues to have some pain however her A1c 12 days ago was 11. I advised her we really should not do a steroid injection until her A1c is better controlled. We will add tramadol and Voltaren gel, continue meloxicam , she can return to see me when her A1c is under better control and we will consider the steroid injection since viscosupplementation was not effective. Geniculate artery embolization may be an option in the future.

## 2023-05-12 NOTE — Transitions of Care (Post Inpatient/ED Visit) (Signed)
   05/12/2023  Name: Kayla Scott MRN: [id number] DOB: 12/15/1975  Today's TOC FU Call Status: Today's TOC FU Call Status:: Unsuccessful Call (1st Attempt) Unsuccessful Call (1st Attempt) Date: 05/12/23  Attempted to reach the patient regarding the most recent Inpatient/ED visit.  Follow Up Plan: Additional outreach attempts will be made to reach the patient to complete the Transitions of Care (Post Inpatient/ED visit) call.   Signature  Burnett Carson, RN

## 2023-05-13 ENCOUNTER — Telehealth: Payer: Self-pay

## 2023-05-13 NOTE — Telephone Encounter (Signed)
 Call returned to patient and the encounter is documented in another telephone call today

## 2023-05-13 NOTE — Telephone Encounter (Signed)
 Kayla Scott-  her AVS indicates to follow up with ID and ENT.  Her daughter made the ENT appt but does not have a number for ID.  Looks like she needs a referral.

## 2023-05-13 NOTE — Transitions of Care (Post Inpatient/ED Visit) (Signed)
   05/13/2023  Name: Kayla Scott MRN: [id number] DOB: 09/29/1975  Today's TOC FU Call Status: Today's TOC FU Call Status:: Successful TOC FU Call Completed Unsuccessful Call (1st Attempt) Date: 05/12/23 [facility] FU Call Complete Date: 05/13/23 Patient's Name and Date of Birth confirmed.  Transition Care Management Follow-up Telephone Call Date of Discharge: 05/11/23 Discharge Facility: [facility]) Type of Discharge: Inpatient Admission Primary Inpatient Discharge Diagnosis:: mastoiditis How have you been since you were released from the hospital?: Better Any questions or concerns?: Yes Patient Questions/Concerns:: patient put her daughter, Kayla Scott on the phone and she explained that she will call ENT for an appointment.  the AVS instructions include following up with ID, but there is no referral.  Patient is uninsured and Kayla Scott said her mother will not qualify for Medicaid.  She was in agreement to have our financial counselor reach out to her for assistance with applying for CAFA/OC. Patient Questions/Concerns Addressed: Notified Provider of Patient Questions/Concerns, Other: (Provider notified of need for ID referral.  I also contacted Kayla Scott Art gallery manager, requesting she reach out to the patient about CAFA/ OC.)  Items Reviewed: Did you receive and understand the discharge instructions provided?: Yes Medications obtained,verified, and reconciled?: Partial Review Completed Reason for Partial Mediation Review: She said she has all of her medications as well as a glucometer and she did not have any questions about the med regime and did not need to review the med list Any new allergies since your discharge?: No Dietary orders reviewed?: Yes Type of Diet Ordered:: heart healthy, low sodium, carb modified Do you have support at home?: Yes People in Home [RPT]: alone Name of Support/Comfort Primary Source: She said she lives alone but her daughter has been  providing assistance as needed  Medications Reviewed Today: Medications Reviewed Today   Medications were not reviewed in this encounter     Home Care and Equipment/Supplies: Were Home Health Services Ordered?: No Any new equipment or medical supplies ordered?: No  Functional Questionnaire: Do you need assistance with bathing/showering or dressing?: No Do you need assistance with meal preparation?: No (Daughter assists as needed) Do you need assistance with eating?: No Do you have difficulty maintaining continence: No Do you need assistance with getting out of bed/getting out of a chair/moving?: Yes (Uses cane when her knees feel weak.) Do you have difficulty managing or taking your medications?: No  Follow up appointments reviewed: PCP Follow-up appointment confirmed?: Yes Date of PCP follow-up appointment?: 06/02/23 Follow-up Provider: [name], NP Specialist Hospital Follow-up appointment confirmed?: Yes Date of Specialist follow-up appointment?: 05/15/23 Follow-Up Specialty Provider:: ENT.  06/12/2023- [name], [facility].  She followed up with Sports Medicine yesterday Do you need transportation to your follow-up appointment?: No Do you understand care options if your condition(s) worsen?: Yes-patient verbalized understanding    SIGNATURE Burnett Carson, RN

## 2023-05-15 ENCOUNTER — Ambulatory Visit (INDEPENDENT_AMBULATORY_CARE_PROVIDER_SITE_OTHER): Payer: Self-pay | Admitting: Otolaryngology

## 2023-05-15 ENCOUNTER — Other Ambulatory Visit: Payer: Self-pay | Admitting: Nurse Practitioner

## 2023-05-15 ENCOUNTER — Encounter (INDEPENDENT_AMBULATORY_CARE_PROVIDER_SITE_OTHER): Payer: Self-pay

## 2023-05-15 VITALS — BP 115/82 | HR 121 | Ht 60.0 in | Wt 278.0 lb

## 2023-05-15 DIAGNOSIS — H66002 Acute suppurative otitis media without spontaneous rupture of ear drum, left ear: Secondary | ICD-10-CM

## 2023-05-15 DIAGNOSIS — H9190 Unspecified hearing loss, unspecified ear: Secondary | ICD-10-CM

## 2023-05-15 DIAGNOSIS — H70002 Acute mastoiditis without complications, left ear: Secondary | ICD-10-CM

## 2023-05-15 DIAGNOSIS — H938X2 Other specified disorders of left ear: Secondary | ICD-10-CM

## 2023-05-15 MED ORDER — AMOXICILLIN-POT CLAVULANATE 875-125 MG PO TABS
1.0000 | ORAL_TABLET | Freq: Two times a day (BID) | ORAL | 0 refills | Status: DC
Start: 2023-05-15 — End: 2023-05-22

## 2023-05-15 MED ORDER — LEVOFLOXACIN 750 MG PO TABS: 750.0000 mg | ORAL_TABLET | Freq: Every day | ORAL | 0 refills | Status: DC

## 2023-05-15 NOTE — Progress Notes (Signed)
 Dear Dr. Jamal Mays, Here is my assessment for our mutual patient, Kayla Scott. Thank you for allowing me the opportunity to care for your patient. Please do not hesitate to contact me should you have any other questions. Sincerely, Dr. Milon Aloe  Otolaryngology Clinic Note Referring provider: Dr. Jamal Mays HPI:  Kayla Scott is a 48 y.o. female kindly referred by Dr. Jamal Mays for evaluation of left otitis media and concern for mastoiditis.   Initial visit (05/2023): Noted left ear pain and URI sx in late April as below which did not improve and then admitted to hospital for left OM, possible OE. She was discharged on 05/11/2023 after IV antibiotic administration on ciprodex  drops, and augmentin  and levofloxacin .  She presents for follow up today. She reports that she has been slowly improving. Keep left ear dry. Some left ear discomfort and temporal discomfort persists but denies fevers. No trismus. Hearing continues to be muffled in the left ear. She has not noted any drainage. No light sensitivity, fevers. Patient denies: vertigo, drainage Patient additionally denies: eustachian tube symptoms such as popping, crackling, sensitive to pressure changes Patient also denies barotrauma, vestibular suppressant use, ototoxic medication use Prior ear surgery: denies She reports she does not typically have ear infections.  She has been adherent to taking medications and drops and keeping ear dry. Her sugars have been in 300s.  H&N Surgery: denies Personal or FHx of bleeding dz or anesthesia difficulty: no   GLP-1: no AP/AC: no  Tobacco: no  PMHx: T2DM, HTN, HLD  Independent Review of Additional Tests or Records:  [facility] Visit (05/11/2023 - Dr. Suzan Erm) - left ear pain 4-5 days, given unasyn  but got worse, broadened to staph and on vanc/zosyn  with improval; eventually discharged on augmentin  and levoflox with ciprodex  drops. Dr. Virgia Griffins ENT note  05/09/2023: left ear infection and drainage; dx left OE; Rx: abx Dr. Levern Reader (05/10/2023) ID: Dx: AOM; Rx: ciprodex  drops, augm and levo CBC 05/11/2023: WBC 6.2, 4/24: 11.2; BMP BUN/Cr 5/0.61 CT Temporal bones independently interpreted with respect to ears 05/08/2023: left ME opacification and mastoid partial opacification, no erosion of OC noted, scutum sharp, no tegmen dehiscence noted; on right mastoids and ME well aerated; No significant canal edema noted. No mastoid coalescence. Diffuse paranasal sinus opacification.  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 Isabella Mao office.  Reportedly recommended surgery, but could not afford.   S/P C-section 10/18/2014   UTI (lower urinary tract infection)      Past Surgical History:  Procedure Laterality Date   CESAREAN SECTION N/A 11/09/1998   [place]   [facility]AREAN SECTION N/A 07/24/[facility]   [facility]  Premature delivery due to placenta previa   CESAREAN SECTION WITH BILATERAL TUBAL LIGATION Bilateral 10/18/2014   Procedure: REPEAT CESAREAN SECTION WITH BILATERAL TUBAL LIGATION;  Surgeon: Tresia Fruit, MD;  Location: [facility];  Service: Obstetrics;  Laterality: Bilateral;    Family History  Problem Relation Age of Onset   Diabetes Mother    Colitis Mother    Diabetes Sister    Diabetes Brother    Cancer Paternal Grandmother        uterine     Social Connections: Socially Integrated (05/09/2023)   Social Connection and Isolation Panel [NHANES]  Frequency of Communication with Friends and Family: Twice a week    Frequency of Social Gatherings with Friends and Family: More than three times a week    Attends Religious Services: 1 to 4 times per year    Active Member of [employer] or Organizations: Yes    Attends  Banker Meetings: 1 to 4 times per year    Marital Status: Married      Current Outpatient Medications:    ADVIL  200 MG CAPS, Take 2 capsules (400 mg total) by mouth every 6 (six) hours as needed (for pain)., Disp: 120 capsule, Rfl: 0   AgaMatrix Ultra-Thin Lancets MISC, Check blood glucose twice daily before meals, Disp: 100 each, Rfl: 11   atorvastatin  (LIPITOR) 20 MG tablet, Take 1 tablet (20 mg total) by mouth daily., Disp: 90 tablet, Rfl: 1   Blood Glucose Monitoring Suppl (AGAMATRIX PRESTO) w/Device KIT, Check blood glucose before meals twice daily, Disp: 1 kit, Rfl: 0   Blood Glucose Monitoring Suppl (TRUE METRIX METER) w/Device KIT, Use as instructed. Check blood glucose level by fingerstick twice per day.  E11.65, Disp: 1 kit, Rfl: 0   ciprofloxacin -dexamethasone  (CIPRODEX ) OTIC suspension, Place 4 drops into the left ear 2 (two) times daily., Disp: 7.5 mL, Rfl: 0   diclofenac  Sodium (VOLTAREN ) 1 % GEL, Apply 4 g topically 4 (four) times daily. To affected joint., Disp: 100 g, Rfl: 11   fluconazole  (DIFLUCAN ) 150 MG tablet, For 1 week, take 150 mg (1 tablet) PO every 72 hours for 3 doses (day 1, 4, and 7). Then take 1 tablet (150 mg) once a week for 3 months until gone., Disp: 15 tablet, Rfl: 0   glipiZIDE  (GLUCOTROL ) 10 MG tablet, Take 1 tablet (10 mg total) by mouth 2 (two) times daily before a meal., Disp: 180 tablet, Rfl: 1   glucose blood (AGAMATRIX PRESTO TEST) test strip, Check blood glucose before meals twice daily, Disp: 50 each, Rfl: 11   glucose blood (TRUE METRIX BLOOD GLUCOSE TEST) test strip, Use as instructed. Check blood glucose level by fingerstick twice per day.  E11.65, Disp: 100 each, Rfl: 12   insulin  glargine-yfgn (SEMGLEE ) 100 UNIT/ML injection, Inject 0.1 mLs (10 Units total) into the skin daily., Disp: 10 mL, Rfl: 11   loratadine  (CLARITIN ) 10 MG tablet, Take 1 tablet (10 mg total) by mouth daily., Disp: 90 tablet, Rfl: 1   meloxicam  (MOBIC ) 15 MG  tablet, Take 1 tablet (15 mg total) by mouth daily with a meal for 2 weeks, then once every 24 hours prn pain. (Patient taking differently: Take 15 mg by mouth daily as needed for pain.), Disp: 30 tablet, Rfl: 3   metFORMIN  (GLUCOPHAGE -XR) 500 MG 24 hr tablet, Take 1 tablet (500 mg total) by mouth 2 (two) times daily with a meal., Disp: 360 tablet, Rfl: 1   Semaglutide , 1 MG/DOSE, 4 MG/3ML SOPN, Inject 1 mg as directed once a week., Disp: 3 mL, Rfl: 1   traMADol  (ULTRAM ) 50 MG tablet, Take 1 tablet (50 mg total) by mouth every 8 (eight) hours as needed for moderate pain (pain score 4-6)., Disp: 21 tablet, Rfl: 0   TRUEplus Lancets 28G MISC, Use as instructed. Check blood glucose level by fingerstick twice per day.  E11.65, Disp: 100 each, Rfl: 3   amoxicillin -clavulanate (AUGMENTIN ) 875-125 MG tablet, Take 1 tablet by mouth 2 (two) times daily for 5 days., Disp: 10 tablet, Rfl: 0   levofloxacin  (LEVAQUIN ) 750 MG tablet, Take 1 tablet (  750 mg total) by mouth daily for 5 days. Extended course given continued purulence, Disp: 7 tablet, Rfl: 0  Current Facility-Administered Medications:    acetaminophen  (TYLENOL ) tablet 650 mg, 650 mg, Oral, Once,    Physical Exam:   BP 115/82 (BP Location: Right Wrist, Patient Position: Sitting, Cuff Size: Large)   Pulse (!) 121   Ht 5' (1.524 m)   Wt 278 lb (126.1 kg)   LMP 05/02/2020   SpO2 95%   BMI 54.29 kg/m   Salient findings:  CN II-XII intact Given history and complaints, ear microscopy was indicated and performed for evaluation with findings as below in physical exam section and in procedures; Bilateral EAC clear and without significant edema; on right: TM intact with well pneumatized middle ear spaces; on left: some purulence still present deep in EAC which was suctioned, query small anterior perforation; no granulation at [facility] junction or anywhere noted; TM thickened -- two otowicks placed in EAC and ciprodex  drops applied No postauricular tenderness,  proptosis or fluctuance Weber 512: left Rinne 512: AC > BC b/l  Anterior rhinoscopy: Septum intact; bilateral inferior turbinates with mild hypertrophy; no purulence noted No lesions of oral cavity/oropharynx; dentition fair No obviously palpable neck masses/lymphadenopathy/thyromegaly No respiratory distress or stridor  Seprately Identifiable Procedures:  Prior to initiating any procedures, risks/benefits/alternatives were explained to the patient and verbal consent obtained. Procedure: Bilateral ear microscopy using microscope (CPT [id number]) Pre-procedure diagnosis: left otitis media Post-procedure diagnosis: same Indication: see above; given patient's otologic complaints and history, for improved and comprehensive examination of external ear and tympanic membrane, bilateral otologic examination using microscope was performed  Procedure: Patient was placed semi-recumbent. Both ear canals were examined using the microscope with findings above.Some purulence suctioned deep from EAC and two otowicks placed in left EAC and ciprodex  drops applied. Patient tolerated the procedure well.    Impression & Plans:  Kayla Scott is a 48 y.o. female with h/o DM now with:  1. Left acute suppurative otitis media   2. Subjective hearing loss   3. Sensation of fullness in left ear    Noted left OM likely in setting of DM now with resultant TM perf. Improving on drops and abx. No evidence of acute mastoiditis. Wicks placed  We discussed given her diabetes, the seriousness of the infection. Will extend Augmentin  and Levofloxacin  for additional 7 days and continue ciprodex  drops 4 drops BID AS Return precautions discussed. Advised good control of sugars F/u 1 week   See below regarding exact medications prescribed this encounter including dosages and route: Augmentin  875 PO BID x7d; levaquin  750mg  x7d daily PO   Thank you for allowing me the opportunity to care for your patient. Please  do not hesitate to contact me should you have any other questions.  Sincerely, Milon Aloe, MD Otolaryngologist (ENT), [facility] Phone: [phone] Fax: [phone]  05/24/2023, 6:09 PM   MDM:  Level 4 - [phone] Complexity/Problems addressed: mod Data complexity: high - review of notes, labs; independent CT interpretation - Morbidity: mod  - Prescription Drug prescribed or managed: yes

## 2023-05-21 ENCOUNTER — Encounter (INDEPENDENT_AMBULATORY_CARE_PROVIDER_SITE_OTHER): Payer: Self-pay | Admitting: Otolaryngology

## 2023-05-22 ENCOUNTER — Encounter (INDEPENDENT_AMBULATORY_CARE_PROVIDER_SITE_OTHER): Payer: Self-pay | Admitting: Otolaryngology

## 2023-05-22 ENCOUNTER — Ambulatory Visit (INDEPENDENT_AMBULATORY_CARE_PROVIDER_SITE_OTHER): Payer: Self-pay | Admitting: Otolaryngology

## 2023-05-22 VITALS — BP 110/87 | HR 121 | Ht 60.0 in | Wt 276.0 lb

## 2023-05-22 DIAGNOSIS — H9190 Unspecified hearing loss, unspecified ear: Secondary | ICD-10-CM

## 2023-05-22 DIAGNOSIS — H938X2 Other specified disorders of left ear: Secondary | ICD-10-CM

## 2023-05-22 DIAGNOSIS — H66002 Acute suppurative otitis media without spontaneous rupture of ear drum, left ear: Secondary | ICD-10-CM

## 2023-05-22 DIAGNOSIS — H7292 Unspecified perforation of tympanic membrane, left ear: Secondary | ICD-10-CM

## 2023-05-22 MED ORDER — AMOXICILLIN-POT CLAVULANATE 875-125 MG PO TABS: 1.0000 | ORAL_TABLET | Freq: Two times a day (BID) | ORAL | 0 refills | Status: AC

## 2023-05-22 MED ORDER — LEVOFLOXACIN 750 MG PO TABS: 750.0000 mg | ORAL_TABLET | Freq: Every day | ORAL | 0 refills | Status: AC

## 2023-05-24 NOTE — Progress Notes (Signed)
 Dear Dr. Jamal Mays, Here is my assessment for our mutual patient, Kayla Scott. Thank you for allowing me the opportunity to care for your patient. Please do not hesitate to contact me should you have any other questions. Sincerely, Dr. Milon Aloe  Otolaryngology Clinic Note Referring provider: Dr. Jamal Mays HPI:  Kayla Scott is a 48 y.o. female kindly referred by Dr. Jamal Mays for evaluation of left otitis media and concern for mastoiditis.   Initial visit (05/2023): Noted left ear pain and URI sx in late April as below which did not improve and then admitted to hospital for left OM, possible OE. She was discharged on 05/11/2023 after IV antibiotic administration on ciprodex  drops, and augmentin  and levofloxacin .  She presents for follow up today. She reports that she has been slowly improving. Keep left ear dry. Some left ear discomfort and temporal discomfort persists but denies fevers. No trismus. Hearing continues to be muffled in the left ear. She has not noted any drainage. No light sensitivity, fevers. Patient denies: vertigo, drainage Patient additionally denies: eustachian tube symptoms such as popping, crackling, sensitive to pressure changes Patient also denies barotrauma, vestibular suppressant use, ototoxic medication use Prior ear surgery: denies She reports she does not typically have ear infections.  She has been adherent to taking medications and drops and keeping ear dry. Her sugars have been in 300s. --------------------------------------------------------- 05/22/2023 Seen in follow up. Doing better. Ear pain mild but improving; no headache, fevers, drainage; hearing about same.  She has been compliant with her medications.   H&N Surgery: denies Personal or FHx of bleeding dz or anesthesia difficulty: no   GLP-1: no AP/AC: no  Tobacco: no  PMHx: T2DM, HTN, HLD  Independent Review of Additional Tests or Records:  [facility] Visit  (05/11/2023 - Dr. Suzan Erm) - left ear pain 4-5 days, given unasyn  but got worse, broadened to staph and on vanc/zosyn  with improval; eventually discharged on augmentin  and levoflox with ciprodex  drops. Dr. Virgia Griffins ENT note 05/09/2023: left ear infection and drainage; dx left OE; Rx: abx Dr. Levern Reader (05/10/2023) ID: Dx: AOM; Rx: ciprodex  drops, augm and levo CBC 05/11/2023: WBC 6.2, 4/24: 11.2; BMP BUN/Cr 5/0.61 CT Temporal bones independently interpreted with respect to ears 05/08/2023: left ME opacification and mastoid partial opacification, no erosion of OC noted, scutum sharp, no tegmen dehiscence noted; on right mastoids and ME well aerated; No significant canal edema noted. No mastoid coalescence. Diffuse paranasal sinus opacification.  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 Isabella Mao office.  Reportedly recommended surgery, but could not afford.   S/P C-section 10/18/2014   UTI (lower urinary tract infection)      Past Surgical History:  Procedure Laterality Date   CESAREAN SECTION N/A 11/09/1998   [place]   [facility]AREAN SECTION N/A 07/24/2001   [facility]  Premature delivery due to placenta previa   CESAREAN SECTION WITH BILATERAL TUBAL LIGATION Bilateral 10/18/2014   Procedure: REPEAT CESAREAN SECTION WITH BILATERAL TUBAL LIGATION;  Surgeon: Tresia Fruit, MD;  Location: [facility];  Service: Obstetrics;  Laterality: Bilateral;    Family History  Problem Relation Age of Onset   Diabetes Mother    Colitis Mother    Diabetes Sister    Diabetes Brother  Cancer Paternal Grandmother        uterine     Social Connections: Socially Integrated (05/09/2023)   Social Connection and Isolation Panel [NHANES]    Frequency of Communication with Friends and  Family: Twice a week    Frequency of Social Gatherings with Friends and Family: More than three times a week    Attends Religious Services: 1 to 4 times per year    Active Member of [employer] or Organizations: Yes    Attends Banker Meetings: 1 to 4 times per year    Marital Status: Married      Current Outpatient Medications:    ADVIL  200 MG CAPS, Take 2 capsules (400 mg total) by mouth every 6 (six) hours as needed (for pain)., Disp: 120 capsule, Rfl: 0   AgaMatrix Ultra-Thin Lancets MISC, Check blood glucose twice daily before meals, Disp: 100 each, Rfl: 11   atorvastatin  (LIPITOR) 20 MG tablet, Take 1 tablet (20 mg total) by mouth daily., Disp: 90 tablet, Rfl: 1   Blood Glucose Monitoring Suppl (AGAMATRIX PRESTO) w/Device KIT, Check blood glucose before meals twice daily, Disp: 1 kit, Rfl: 0   Blood Glucose Monitoring Suppl (TRUE METRIX METER) w/Device KIT, Use as instructed. Check blood glucose level by fingerstick twice per day.  E11.65, Disp: 1 kit, Rfl: 0   ciprofloxacin -dexamethasone  (CIPRODEX ) OTIC suspension, Place 4 drops into the left ear 2 (two) times daily., Disp: 7.5 mL, Rfl: 0   diclofenac  Sodium (VOLTAREN ) 1 % GEL, Apply 4 g topically 4 (four) times daily. To affected joint., Disp: 100 g, Rfl: 11   fluconazole  (DIFLUCAN ) 150 MG tablet, For 1 week, take 150 mg (1 tablet) PO every 72 hours for 3 doses (day 1, 4, and 7). Then take 1 tablet (150 mg) once a week for 3 months until gone., Disp: 15 tablet, Rfl: 0   glipiZIDE  (GLUCOTROL ) 10 MG tablet, Take 1 tablet (10 mg total) by mouth 2 (two) times daily before a meal., Disp: 180 tablet, Rfl: 1   glucose blood (AGAMATRIX PRESTO TEST) test strip, Check blood glucose before meals twice daily, Disp: 50 each, Rfl: 11   glucose blood (TRUE METRIX BLOOD GLUCOSE TEST) test strip, Use as instructed. Check blood glucose level by fingerstick twice per day.  E11.65, Disp: 100 each, Rfl: 12   insulin  glargine-yfgn (SEMGLEE ) 100 UNIT/ML  injection, Inject 0.1 mLs (10 Units total) into the skin daily., Disp: 10 mL, Rfl: 11   loratadine  (CLARITIN ) 10 MG tablet, Take 1 tablet (10 mg total) by mouth daily., Disp: 90 tablet, Rfl: 1   meloxicam  (MOBIC ) 15 MG tablet, Take 1 tablet (15 mg total) by mouth daily with a meal for 2 weeks, then once every 24 hours prn pain. (Patient taking differently: Take 15 mg by mouth daily as needed for pain.), Disp: 30 tablet, Rfl: 3   metFORMIN  (GLUCOPHAGE -XR) 500 MG 24 hr tablet, Take 1 tablet (500 mg total) by mouth 2 (two) times daily with a meal., Disp: 360 tablet, Rfl: 1   Semaglutide , 1 MG/DOSE, 4 MG/3ML SOPN, Inject 1 mg as directed once a week., Disp: 3 mL, Rfl: 1   traMADol  (ULTRAM ) 50 MG tablet, Take 1 tablet (50 mg total) by mouth every 8 (eight) hours as needed for moderate pain (pain score 4-6)., Disp: 21 tablet, Rfl: 0   TRUEplus Lancets 28G MISC, Use as instructed. Check blood glucose level by fingerstick twice per day.  E11.65, Disp: 100 each, Rfl: 3   amoxicillin -clavulanate (  AUGMENTIN ) 875-125 MG tablet, Take 1 tablet by mouth 2 (two) times daily for 5 days., Disp: 10 tablet, Rfl: 0   levofloxacin  (LEVAQUIN ) 750 MG tablet, Take 1 tablet (750 mg total) by mouth daily for 5 days. Extended course given continued purulence, Disp: 7 tablet, Rfl: 0  Current Facility-Administered Medications:    acetaminophen  (TYLENOL ) tablet 650 mg, 650 mg, Oral, Once,    Physical Exam:   BP 110/87 (BP Location: Left Arm, Patient Position: Sitting, Cuff Size: Large)   Pulse (!) 121   Ht 5' (1.524 m)   Wt 276 lb (125.2 kg)   LMP 05/02/2020   SpO2 93%   BMI 53.90 kg/m   Salient findings:  CN II-XII intact Given history and complaints, ear microscopy was indicated and performed for evaluation with findings as below in physical exam section and in procedures; Bilateral EAC clear and without significant edema; on right: TM intact with well pneumatized middle ear spaces; on left: no purulent and TM is now  much thinner and can see landmarks; small anterior (~10%) perforation without epithelial debris, small speck of granulation just on edge of TM but nowhere else otherwise; no purulence -- again two otowicks removed and replaced in EAC and ciprodex  drops applied No postauricular tenderness, proptosis or fluctuance Weber 512: left Rinne 512: AC > BC b/l  Anterior rhinoscopy: Septum intact; bilateral inferior turbinates with mild hypertrophy; no purulence noted No lesions of oral cavity/oropharynx; dentition fair No obviously palpable neck masses/lymphadenopathy/thyromegaly No respiratory distress or stridor  Seprately Identifiable Procedures:  Prior to initiating any procedures, risks/benefits/alternatives were explained to the patient and verbal consent obtained. Procedure: Bilateral ear microscopy using microscope (CPT [phone]) Pre-procedure diagnosis: left otitis media Post-procedure diagnosis: same Indication: see above; given patient's otologic complaints and history, for improved and comprehensive examination of external ear and tympanic membrane, bilateral otologic examination using microscope was performed  Procedure: Patient was placed semi-recumbent. Both ear canals were examined using the microscope with findings above.Two otowicks removed and replaced in left EAC and ciprodex  drops applied. Patient tolerated the procedure well.    Impression & Plans:  Kayla Scott is a 48 y.o. female with h/o DM now with:  1. Left acute suppurative otitis media   2. Subjective hearing loss   3. Sensation of fullness in left ear   4. Tympanic membrane perforation, left    Noted left OM likely in setting of DM now with resultant TM perf.  Continues to improve on drops but still with some pain.   We discussed given her diabetes, the seriousness of the infection. Will again extend Augmentin  and Levofloxacin  for additional 5 days and continue ciprodex  drops 4 drops BID AS Return  precautions discussed. Advised good control of sugars F/u 2 weeks   See below regarding exact medications prescribed this encounter including dosages and route:  Meds ordered this encounter  Medications   amoxicillin -clavulanate (AUGMENTIN ) 875-125 MG tablet    Sig: Take 1 tablet by mouth 2 (two) times daily for 5 days.    Dispense:  10 tablet    Refill:  0    Extended course given continued purulence   levofloxacin  (LEVAQUIN ) 750 MG tablet    Sig: Take 1 tablet (750 mg total) by mouth daily for 5 days. Extended course given continued purulence    Dispense:  7 tablet    Refill:  0    Thank you for allowing me the opportunity to care for your patient. Please do not hesitate to contact  me should you have any other questions.  Sincerely, Milon Aloe, MD Otolaryngologist (ENT), [facility] Phone: [phone] Fax: [phone]  05/24/2023, 6:15 PM   MDM:  Level 3 - [id number] Complexity/Problems addressed: low Data complexity: low - Morbidity: mod  - Prescription Drug prescribed or managed: yes

## 2023-06-02 ENCOUNTER — Ambulatory Visit: Payer: Self-pay | Attending: Nurse Practitioner | Admitting: Nurse Practitioner

## 2023-06-02 ENCOUNTER — Other Ambulatory Visit: Payer: Self-pay

## 2023-06-02 ENCOUNTER — Encounter: Payer: Self-pay | Admitting: Nurse Practitioner

## 2023-06-02 VITALS — BP 125/82 | HR 108 | Resp 19 | Ht 60.0 in | Wt 279.0 lb

## 2023-06-02 DIAGNOSIS — Z23 Encounter for immunization: Secondary | ICD-10-CM

## 2023-06-02 DIAGNOSIS — E119 Type 2 diabetes mellitus without complications: Secondary | ICD-10-CM

## 2023-06-02 DIAGNOSIS — Z7985 Long-term (current) use of injectable non-insulin antidiabetic drugs: Secondary | ICD-10-CM

## 2023-06-02 DIAGNOSIS — Z7984 Long term (current) use of oral hypoglycemic drugs: Secondary | ICD-10-CM

## 2023-06-02 DIAGNOSIS — Z1211 Encounter for screening for malignant neoplasm of colon: Secondary | ICD-10-CM

## 2023-06-02 DIAGNOSIS — Z09 Encounter for follow-up examination after completed treatment for conditions other than malignant neoplasm: Secondary | ICD-10-CM

## 2023-06-02 MED ORDER — SEMAGLUTIDE (2 MG/DOSE) 8 MG/3ML ~~LOC~~ SOPN: 2.0000 mg | PEN_INJECTOR | SUBCUTANEOUS | 6 refills | Status: DC

## 2023-06-02 NOTE — Patient Instructions (Addendum)
 USE [employer] for counting calories and carbs Should not be consuming more than 1200 calories per day No more than 40 carbs per day Protein intake should be around 70-80 grams daily Water intake should be around 80 oz per day.

## 2023-06-02 NOTE — Progress Notes (Signed)
 Assessment & Plan:   Kayla Scott was seen today for hospitalization follow-up.  Diagnoses and all orders for this visit:  Diabetes mellitus treated with oral medication  DOSE CHANGE  -     Semaglutide , 2 MG/DOSE, 8 MG/3ML SOPN; Inject 2 mg as directed once a week. -     CMP14+EGFR  Need for vaccination with 20-polyvalent pneumococcal conjugate vaccine -     Cancel: Pneumococcal conjugate vaccine 20-valent  Hospital discharge follow-up Follow up with ENT next week.   Colon cancer screening -     Ambulatory referral to Gastroenterology    Patient has been counseled on age-appropriate routine health concerns for screening and prevention. These are reviewed and up-to-date. Referrals have been placed accordingly. Immunizations are up-to-date or declined.    Subjective:   Chief Complaint  Patient presents with   Hospitalization Follow-up    Kayla Scott 48 y.o. female presents to office today for HFU  Although I am seeing her today for hospital follow-up she has since been seen by the ENT specialist on 05/22/2023 for diagnosis related to hospital follow-up  PER ENT NOTES: 1. Left acute suppurative otitis media   2. Subjective hearing loss   3. Sensation of fullness in left ear   4. Tympanic membrane perforation, left     Noted left OM likely in setting of DM now with resultant TM perf.  Continues to improve on drops but still with some pain.   We discussed given her diabetes, the seriousness of the infection. Will again extend Augmentin  and Levofloxacin  for additional 5 days and continue ciprodex  drops 4 drops BID AS Return precautions discussed. Advised good control of sugars F/u 2 weeks LAST A1c 11.1 (04-30-2023)   Today she is accompanied by her daughter who is translating for her.  She is currently administering ciprodex  ear drops. Has completed antibiotics. They are requesting a handicap placard due to her chronic knee pain. She is unable to have surgery due  to BMI and A1c.  She is currently prescribed semglee  10 units daily, glipizide  10 mg BID, ozempic  1 mg weekly. Weight is trending up. She is not monitoring her post prandial readings (only early am) Lab Results  Component Value Date   HGBA1C 11.1 (A) 04/30/2023        Review of Systems  Constitutional:  Negative for fever, malaise/fatigue and weight loss.  HENT:  Positive for ear pain. Negative for nosebleeds.   Eyes: Negative.  Negative for blurred vision, double vision and photophobia.  Respiratory: Negative.  Negative for cough and shortness of breath.   Cardiovascular: Negative.  Negative for chest pain, palpitations and leg swelling.  Gastrointestinal: Negative.  Negative for heartburn, nausea and vomiting.  Musculoskeletal:  Positive for joint pain. Negative for myalgias.  Neurological: Negative.  Negative for dizziness, focal weakness, seizures and headaches.  Psychiatric/Behavioral: Negative.  Negative for suicidal ideas.     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 Isabella Mao office.  Reportedly recommended surgery, but could not afford.   S/P C-section 10/18/2014   UTI (lower urinary tract infection)     Past Surgical History:  Procedure Laterality Date   CESAREAN SECTION N/A 11/09/1998   [place]   [facility]AREAN SE[facility] N/A [facility]/24/[facility]  Premature delivery due  to placenta previa   CESAREAN SECTION WITH BILATERAL TUBAL LIGATION Bilateral 10/18/2014   Procedure: REPEAT CESAREAN SECTION WITH BILATERAL TUBAL LIGATION;  Surgeon: Tresia Fruit, MD;  Location: [facility];  Service: Obstetrics;  Laterality: Bilateral;    Family History  Problem Relation Age of Onset   Diabetes Mother    Colitis Mother    Diabetes Sister    Diabetes Brother    Cancer Paternal  Grandmother        uterine    Social History Reviewed with no changes to be made today.   Outpatient Medications Prior to Visit  Medication Sig Dispense Refill   ADVIL  200 MG CAPS Take 2 capsules (400 mg total) by mouth every 6 (six) hours as needed (for pain). 120 capsule 0   AgaMatrix Ultra-Thin Lancets MISC Check blood glucose twice daily before meals 100 each 11   Blood Glucose Monitoring Suppl (AGAMATRIX PRESTO) w/Device KIT Check blood glucose before meals twice daily 1 kit 0   Blood Glucose Monitoring Suppl (TRUE METRIX METER) w/Device KIT Use as instructed. Check blood glucose level by fingerstick twice per day.  E11.65 1 kit 0   ciprofloxacin -dexamethasone  (CIPRODEX ) OTIC suspension Place 4 drops into the left ear 2 (two) times daily. 7.5 mL 0   glipiZIDE  (GLUCOTROL ) 10 MG tablet Take 1 tablet (10 mg total) by mouth 2 (two) times daily before a meal. 180 tablet 1   glucose blood (AGAMATRIX PRESTO TEST) test strip Check blood glucose before meals twice daily 50 each 11   glucose blood (TRUE METRIX BLOOD GLUCOSE TEST) test strip Use as instructed. Check blood glucose level by fingerstick twice per day.  E11.65 100 each 12   insulin  glargine-yfgn (SEMGLEE ) 100 UNIT/ML injection Inject 0.1 mLs (10 Units total) into the skin daily. 10 mL 11   loratadine  (CLARITIN ) 10 MG tablet Take 1 tablet (10 mg total) by mouth daily. 90 tablet 1   meloxicam  (MOBIC ) 15 MG tablet Take 1 tablet (15 mg total) by mouth daily with a meal for 2 weeks, then once every 24 hours prn pain. (Patient taking differently: Take 15 mg by mouth daily as needed for pain.) 30 tablet 3   metFORMIN  (GLUCOPHAGE -XR) 500 MG 24 hr tablet Take 1 tablet (500 mg total) by mouth 2 (two) times daily with a meal. 360 tablet 1   traMADol  (ULTRAM ) 50 MG tablet Take 1 tablet (50 mg total) by mouth every 8 (eight) hours as needed for moderate pain (pain score 4-6). 21 tablet 0   TRUEplus Lancets 28G MISC Use as instructed. Check blood  glucose level by fingerstick twice per day.  E11.65 100 each 3   Semaglutide , 1 MG/DOSE, 4 MG/3ML SOPN Inject 1 mg as directed once a week. 3 mL 1   atorvastatin  (LIPITOR) 20 MG tablet Take 1 tablet (20 mg total) by mouth daily. (Patient not taking: Reported on 06/02/2023) 90 tablet 1   diclofenac  Sodium (VOLTAREN ) 1 % GEL Apply 4 g topically 4 (four) times daily. To affected joint. (Patient not taking: Reported on 06/02/2023) 100 g 11   fluconazole  (DIFLUCAN ) 150 MG tablet For 1 week, take 150 mg (1 tablet) PO every 72 hours for 3 doses (day 1, 4, and 7). Then take 1 tablet (150 mg) once a week for 3 months until gone. (Patient not taking: Reported on 06/02/2023) 15 tablet 0   Facility-Administered Medications Prior to Visit  Medication Dose Route Frequency Provider Last Rate Last Admin   acetaminophen  (TYLENOL ) tablet  650 mg  650 mg Oral Once         No Known Allergies     Objective:    BP 125/82 (BP Location: Left Arm, Patient Position: Sitting, Cuff Size: Normal)   Pulse (!) 108   Resp 19   Ht 5' (1.524 m)   Wt 279 lb (126.6 kg)   LMP 05/02/2020   SpO2 100%   BMI 54.49 kg/m  Wt Readings from Last 3 Encounters:  06/02/23 279 lb (126.6 kg)  05/22/23 276 lb (125.2 kg)  05/15/23 278 lb (126.1 kg)    Physical Exam Vitals and nursing note reviewed.  Constitutional:      Appearance: She is well-developed. She is obese.  HENT:     Head: Normocephalic and atraumatic.  Cardiovascular:     Rate and Rhythm: Normal rate and regular rhythm.     Heart sounds: Normal heart sounds. No murmur heard.    No friction rub. No gallop.  Pulmonary:     Effort: Pulmonary effort is normal. No tachypnea or respiratory distress.     Breath sounds: Normal breath sounds. No decreased breath sounds, wheezing, rhonchi or rales.  Chest:     Chest wall: No tenderness.  Abdominal:     General: Bowel sounds are normal.     Palpations: Abdomen is soft.  Musculoskeletal:        General: Normal range of  motion.     Cervical back: Normal range of motion.     Comments: Using cane due to chronic knee pain  Skin:    General: Skin is warm and dry.  Neurological:     Mental Status: She is alert and oriented to person, place, and time.     Coordination: Coordination normal.  Psychiatric:        Behavior: Behavior normal. Behavior is cooperative.        Thought Content: Thought content normal.        Judgment: Judgment normal.          Patient has been counseled extensively about nutrition and exercise as well as the importance of adherence with medications and regular follow-up. The patient was given clear instructions to go to ER or return to medical center if symptoms don't improve, worsen or new problems develop. The patient verbalized understanding.   Follow-up: Return for see me end of July.   Collins Dean, FNP-BC [facility] [place], [place] [phone]   06/02/2023, 9:13 PM

## 2023-06-03 ENCOUNTER — Ambulatory Visit (INDEPENDENT_AMBULATORY_CARE_PROVIDER_SITE_OTHER): Payer: Self-pay | Admitting: Otolaryngology

## 2023-06-03 ENCOUNTER — Other Ambulatory Visit: Payer: Self-pay | Admitting: Nurse Practitioner

## 2023-06-03 ENCOUNTER — Encounter (INDEPENDENT_AMBULATORY_CARE_PROVIDER_SITE_OTHER): Payer: Self-pay | Admitting: Otolaryngology

## 2023-06-03 VITALS — BP 119/79 | HR 99 | Ht 60.0 in | Wt 276.0 lb

## 2023-06-03 DIAGNOSIS — E119 Type 2 diabetes mellitus without complications: Secondary | ICD-10-CM

## 2023-06-03 DIAGNOSIS — H66002 Acute suppurative otitis media without spontaneous rupture of ear drum, left ear: Secondary | ICD-10-CM

## 2023-06-03 DIAGNOSIS — H9190 Unspecified hearing loss, unspecified ear: Secondary | ICD-10-CM

## 2023-06-03 DIAGNOSIS — H938X2 Other specified disorders of left ear: Secondary | ICD-10-CM

## 2023-06-03 DIAGNOSIS — H7292 Unspecified perforation of tympanic membrane, left ear: Secondary | ICD-10-CM

## 2023-06-03 NOTE — Telephone Encounter (Signed)
 Copied from CRM [id number]. Topic: Clinical - Medication Refill >> Jun 03, 2023 10:33 AM Carlatta H wrote: Medication: Semaglutide , 2 MG/DOSE, 8 MG/3ML SOPN [[id number]] insulin  glargine-yfgn (SEMGLEE ) 100 UNIT/ML injection [[id number]]  Has the patient contacted their pharmacy? No (Agent: If no, request that the patient contact the pharmacy for the refill. If patient does not wish to contact the pharmacy document the reason why and proceed with request.) (Agent: If yes, when and what did the pharmacy advise?)  This is the patient's preferred pharmacy:   [facility] [[id number]] Is this the correct pharmacy for this prescription? Yes If no, delete pharmacy and type the correct one.   Has the prescription been filled recently? No  Is the patient out of the medication? Yes  Has the patient been seen for an appointment in the last year OR does the patient have an upcoming appointment? Yes  Can we respond through MyChart? Yes  Agent: Please be advised that Rx refills may take up to 3 business days. We ask that you follow-up with your pharmacy.

## 2023-06-03 NOTE — Progress Notes (Signed)
 Dear Dr. Jamal Mays, Here is my assessment for our mutual patient, Kayla Scott. Thank you for allowing me the opportunity to care for your patient. Please do not hesitate to contact me should you have any other questions. Sincerely, Dr. Milon Aloe  Otolaryngology Clinic Note Referring provider: Dr. Jamal Mays HPI:  Kayla Scott is a 48 y.o. female kindly referred by Dr. Jamal Mays for evaluation of left otitis media and concern for mastoiditis.   Initial visit (05/2023): Noted left ear pain and URI sx in late April as below which did not improve and then admitted to hospital for left OM, possible OE. She was discharged on 05/11/2023 after IV antibiotic administration on ciprodex  drops, and augmentin  and levofloxacin .  She presents for follow up today. She reports that she has been slowly improving. Keep left ear dry. Some left ear discomfort and temporal discomfort persists but denies fevers. No trismus. Hearing continues to be muffled in the left ear. She has not noted any drainage. No light sensitivity, fevers. Patient denies: vertigo, drainage Patient additionally denies: eustachian tube symptoms such as popping, crackling, sensitive to pressure changes Patient also denies barotrauma, vestibular suppressant use, ototoxic medication use Prior ear surgery: denies She reports she does not typically have ear infections.  She has been adherent to taking medications and drops and keeping ear dry. Her sugars have been in 300s. --------------------------------------------------------- 05/22/2023 Seen in follow up. Doing better. Ear pain mild but improving; no headache, fevers, drainage; hearing about same.  She has been compliant with her medications. --------------------------------------------------------- 06/03/2023 Seen in follow up. Feels back to normal. Ear pain has resolved. No headache, fevers, drainage. Hearing she thinks is back to baseline. Has finished abx, doing  drops.    H&N Surgery: denies Personal or FHx of bleeding dz or anesthesia difficulty: no   GLP-1: no AP/AC: no  Tobacco: no  PMHx: T2DM, HTN, HLD  Independent Review of Additional Tests or Records:  [facility] Admission [facility] ED Visit (05/11/2023 - Dr. Suzan Erm) - left ear pain 4-5 days, given unasyn  but got worse, broadened to staph and on vanc/zosyn  with improval; eventually discharged on augmentin  and levoflox with ciprodex  drops. Dr. Virgia Griffins ENT note 05/09/2023: left ear infection and drainage; dx left OE; Rx: abx Dr. Levern Reader (05/10/2023) ID: Dx: AOM; Rx: ciprodex  drops, augm and levo CBC 05/11/2023: WBC 6.2, 4/24: 11.2; BMP BUN/Cr 5/0.61 CT Temporal bones independently interpreted with respect to ears 05/08/2023: left ME opacification and mastoid partial opacification, no erosion of OC noted, scutum sharp, no tegmen dehiscence noted; on right mastoids and ME well aerated; No significant canal edema noted. No mastoid coalescence. Diffuse paranasal sinus opacification.  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 Isabella Mao office.  Reportedly recommended surgery, but could not afford.   S/P C-section 10/18/2014   UTI (lower urinary tract infection)      Past Surgical History:  Procedure Laterality Date   CESAREAN SECTION N/A 11/09/1998   [place]   CESAREAN SECTION N/A 07/24/2001   [facility]  Premature delivery due to placenta previa   CESAREAN SECTION WITH BILATERAL TUBAL LIGATION Bilateral 10/18/2014   Procedure: REPEAT CESAREAN SECTION WITH BILATERAL TUBAL LIGATION;  Surgeon: Tresia Fruit, MD;  Location: [facility];  Service: Obstetrics;  Laterality: Bilateral;  Family History  Problem Relation Age of Onset   Diabetes Mother    Colitis Mother     Diabetes Sister    Diabetes Brother    Cancer Paternal Grandmother        uterine     Social Connections: Socially Integrated (05/09/2023)   Social Connection and Isolation Panel [NHANES]    Frequency of Communication with Friends and Family: Twice a week    Frequency of Social Gatherings with Friends and Family: More than three times a week    Attends Religious Services: 1 to 4 times per year    Active Member of [employer] or Organizations: Yes    Attends Banker Meetings: 1 to 4 times per year    Marital Status: Married      Current Outpatient Medications:    ADVIL  200 MG CAPS, Take 2 capsules (400 mg total) by mouth every 6 (six) hours as needed (for pain)., Disp: 120 capsule, Rfl: 0   AgaMatrix Ultra-Thin Lancets MISC, Check blood glucose twice daily before meals, Disp: 100 each, Rfl: 11   atorvastatin  (LIPITOR) 20 MG tablet, Take 1 tablet (20 mg total) by mouth daily., Disp: 90 tablet, Rfl: 1   Blood Glucose Monitoring Suppl (AGAMATRIX PRESTO) w/Device KIT, Check blood glucose before meals twice daily, Disp: 1 kit, Rfl: 0   Blood Glucose Monitoring Suppl (TRUE METRIX METER) w/Device KIT, Use as instructed. Check blood glucose level by fingerstick twice per day.  E11.65, Disp: 1 kit, Rfl: 0   ciprofloxacin -dexamethasone  (CIPRODEX ) OTIC suspension, Place 4 drops into the left ear 2 (two) times daily., Disp: 7.5 mL, Rfl: 0   diclofenac  Sodium (VOLTAREN ) 1 % GEL, Apply 4 g topically 4 (four) times daily. To affected joint., Disp: 100 g, Rfl: 11   fluconazole  (DIFLUCAN ) 150 MG tablet, For 1 week, take 150 mg (1 tablet) PO every 72 hours for 3 doses (day 1, 4, and 7). Then take 1 tablet (150 mg) once a week for 3 months until gone., Disp: 15 tablet, Rfl: 0   glipiZIDE  (GLUCOTROL ) 10 MG tablet, Take 1 tablet (10 mg total) by mouth 2 (two) times daily before a meal., Disp: 180 tablet, Rfl: 1   glucose blood (AGAMATRIX PRESTO TEST) test strip, Check blood glucose before meals twice  daily, Disp: 50 each, Rfl: 11   glucose blood (TRUE METRIX BLOOD GLUCOSE TEST) test strip, Use as instructed. Check blood glucose level by fingerstick twice per day.  E11.65, Disp: 100 each, Rfl: 12   insulin  glargine-yfgn (SEMGLEE ) 100 UNIT/ML injection, Inject 0.1 mLs (10 Units total) into the skin daily., Disp: 10 mL, Rfl: 11   loratadine  (CLARITIN ) 10 MG tablet, Take 1 tablet (10 mg total) by mouth daily., Disp: 90 tablet, Rfl: 1   meloxicam  (MOBIC ) 15 MG tablet, Take 1 tablet (15 mg total) by mouth daily with a meal for 2 weeks, then once every 24 hours prn pain. (Patient taking differently: Take 15 mg by mouth daily as needed for pain.), Disp: 30 tablet, Rfl: 3   metFORMIN  (GLUCOPHAGE -XR) 500 MG 24 hr tablet, Take 1 tablet (500 mg total) by mouth 2 (two) times daily with a meal., Disp: 360 tablet, Rfl: 1   Semaglutide , 2 MG/DOSE, 8 MG/3ML SOPN, Inject 2 mg as directed once a week., Disp: 3 mL, Rfl: 6   traMADol  (ULTRAM ) 50 MG tablet, Take 1 tablet (50 mg total) by mouth every 8 (eight) hours as needed for moderate pain (pain score 4-6)., Disp: 21 tablet,  Rfl: 0   TRUEplus Lancets 28G MISC, Use as instructed. Check blood glucose level by fingerstick twice per day.  E11.65, Disp: 100 each, Rfl: 3  Current Facility-Administered Medications:    acetaminophen  (TYLENOL ) tablet 650 mg, 650 mg, Oral, Once,    Physical Exam:   BP 119/79 (BP Location: Right Wrist, Patient Position: Sitting, Cuff Size: Large)   Pulse 99   Ht 5' (1.524 m)   Wt 276 lb (125.2 kg)   LMP 05/02/2020   SpO2 95%   BMI 53.90 kg/m   Salient findings:  CN II-XII intact Given history and complaints, ear microscopy was indicated and performed for evaluation with findings as below in physical exam section and in procedures; Bilateral EAC clear and without significant edema; on right: TM intact with well pneumatized middle ear spaces; on left: otowicks removed; TM perforation healed, TM now intact and well aerated ME space. No  granulation No postauricular tenderness, proptosis or fluctuance Weber 512: mid Rinne 512: AC > BC b/l  Anterior rhinoscopy: Septum intact; bilateral inferior turbinates with mild hypertrophy; no purulence noted No lesions of oral cavity/oropharynx No obviously palpable neck masses/lymphadenopathy/thyromegaly No respiratory distress or stridor  Seprately Identifiable Procedures:  Prior to initiating any procedures, risks/benefits/alternatives were explained to the patient and verbal consent obtained. Procedure: Bilateral ear microscopy using microscope (CPT [phone]) Pre-procedure diagnosis: left otitis media Post-procedure diagnosis: same Indication: see above; given patient's otologic complaints and history, for improved and comprehensive examination of external ear and tympanic membrane, bilateral otologic examination using microscope was performed  Procedure: Patient was placed semi-recumbent. Both ear canals were examined using the microscope with findings above. Patient tolerated the procedure well.    Impression & Plans:  Krislyn Scott is a 48 y.o. female with h/o DM now with:  1. Left acute suppurative otitis media   2. Subjective hearing loss   3. Sensation of fullness in left ear   4. Tympanic membrane perforation, left    Noted left OM likely in setting of DM now with resultant TM perf - resolved. Significantly improved, TM intact. No effusion noted today. Given resolution and return to baseline, discussed importance of BG monitoring and control  Will f/u in 3 months with audio to get baseline audio results   See below regarding exact medications prescribed this encounter including dosages and route:  No orders of the defined types were placed in this encounter.   Thank you for allowing me the opportunity to care for your patient. Please do not hesitate to contact me should you have any other questions.  Sincerely, Milon Aloe, MD Otolaryngologist  (ENT), [facility] Phone: [phone] Fax: [phone]  06/03/2023, 9:53 AM   MDM:  Level 3 - 99213 Complexity/Problems addressed: low Data complexity: low - Morbidity: low - Prescription Drug prescribed or managed: no

## 2023-06-04 ENCOUNTER — Other Ambulatory Visit: Payer: Self-pay

## 2023-06-04 ENCOUNTER — Encounter: Payer: Self-pay | Admitting: Internal Medicine

## 2023-06-04 ENCOUNTER — Ambulatory Visit (INDEPENDENT_AMBULATORY_CARE_PROVIDER_SITE_OTHER): Payer: Self-pay | Admitting: Internal Medicine

## 2023-06-04 VITALS — BP 104/69 | HR 103 | Ht 60.0 in | Wt 282.0 lb

## 2023-06-04 DIAGNOSIS — H6690 Otitis media, unspecified, unspecified ear: Secondary | ICD-10-CM

## 2023-06-04 DIAGNOSIS — H7092 Unspecified mastoiditis, left ear: Secondary | ICD-10-CM

## 2023-06-04 DIAGNOSIS — H6692 Otitis media, unspecified, left ear: Secondary | ICD-10-CM

## 2023-06-04 NOTE — Telephone Encounter (Signed)
 Requested medication (s) are due for refill today -no  Requested medication (s) are on the active medication list -yes  Future visit scheduled -yes  Last refill: 05/12/23 10ml 11RF  Notes to clinic: off protocol- provider review   Requested Prescriptions  Pending Prescriptions Disp Refills   insulin  glargine-yfgn (SEMGLEE ) 100 UNIT/ML injection 10 mL 11    Sig: Inject 0.1 mLs (10 Units total) into the skin daily.     Off-Protocol Failed - 06/04/2023  4:15 PM      Failed - Medication not assigned to a protocol, review manually.      Passed - Valid encounter within last 12 months    Recent Outpatient Visits           2 days ago Diabetes mellitus treated with oral medication (HCC)   [place] [facility] - A Dept Of [place]. [facility] Collins Dean, NP   3 weeks ago Chronic pain of right knee   [facility] Primary [facility] & [facility] Medicine at [facility], Joselyn Nicely, MD   4 weeks ago Type 2 diabetes mellitus with hyperglycemia, unspecified whether long term insulin  use [facility])   [place] [facility] - A Dept Of [place]. [facility] [place], [place], PA-C   1 month ago Diabetes mellitus treated with oral medication [facility])   [place] [facility] - A Dept Of [place]. [facility] Collins Dean, NP   2 months ago Osteoarthritis of right knee, unspecified osteoarthritis type   [facility] & [facility] Medicine at [facility], Joselyn Nicely, MD       Future Appointments             In 2 months Fleming, [name] W, NP [facility] - A Dept Of [place]. [facility]            Refused Prescriptions Disp Refills   Semaglutide , 2 MG/DOSE, 8 MG/3ML SOPN 3 mL 6    Sig: Inject 2 mg as directed once a week.     Endocrinology:  Diabetes - GLP-1 Receptor Agonists - semaglutide  Failed - 06/04/2023  4:15 PM      Failed - HBA1C in normal range and  within 180 days    Hemoglobin A1C  Date Value Ref Range Status  04/30/2023 11.1 (A) 4.0 - 5.6 % Final   HbA1c, POC (controlled diabetic range)  Date Value Ref Range Status  01/23/2023 10.9 (A) 0.0 - 7.0 % Final         Passed - Cr in normal range and within 360 days    Creat  Date Value Ref Range Status  05/23/2014 0.54 0.50 - 1.10 mg/dL Final   Creatinine, Ser  Date Value Ref Range Status  05/11/2023 0.61 0.44 - 1.00 mg/dL Final   Creatinine, Urine  Date Value Ref Range Status  05/23/2014 35.5 mg/dL Final    Comment:    No reference range established.         Passed - Valid encounter within last 6 months    Recent Outpatient Visits           2 days ago Diabetes mellitus treated with oral medication (HCC)    [facility] - A Dept Of [place]. [facility] Collins Dean, NP   3 weeks ago Chronic pain of right knee   [facility] at  [facility], MD   4 weeks ago Type 2 diabetes mellitus with hyperglycemia, unspecified whether long term insulin  use [facility])   [place] [facility] - A Dept Of [place]. [facility] [place], [place], PA-C   1 month ago Diabetes mellitus treated with oral medication [facility])   [place] [facility] - A Dept Of [employer]. [facility] Collins Dean, NP   2 months ago Osteoarthritis of right knee, unspecified osteoarthritis type   [facility] at [facility], Joselyn Nicely, MD       Future Appointments             In 2 months Fleming, [name] W, NP [facility] - A Dept Of [place]. [facility]               Requested Prescriptions  Pending Prescriptions Disp Refills   insulin  glargine-yfgn (SEMGLEE ) 100 UNIT/ML injection 10 mL 11    Sig: Inject 0.1 mLs (10 Units total) into the skin daily.     Off-Protocol Failed - 06/04/2023   4:15 PM      Failed - Medication not assigned to a protocol, review manually.      Passed - Valid encounter within last 12 months    Recent Outpatient Visits           2 days ago Diabetes mellitus treated with oral medication (HCC)   [place] [facility] - A Dept Of [place]. [facility] Collins Dean, NP   3 weeks ago Chronic pain of right knee   [facility] Health Primary [facility] & Sports Medicine at [facility], Joselyn Nicely, MD   4 weeks ago Type 2 diabetes mellitus with hyperglycemia, unspecified whether long term insulin  use [facility])   [place] [facility] - A Dept Of [place]. [facility] [place], [place], PA-C   1 month ago Diabetes mellitus treated with oral medication [facility])   [place] [facility] - A Dept Of [place]. [facility] Collins Dean, NP   2 months ago Osteoarthritis of right knee, unspecified osteoarthritis type   [facility] Primary [facility] & Sports Medicine at [facility], Joselyn Nicely, MD       Future Appointments             In 2 months Fleming, [name] W, NP [facility] - A Dept Of . [facility]            Refused Prescriptions Disp Refills   Semaglutide , 2 MG/DOSE, 8 MG/3ML SOPN 3 mL 6    Sig: Inject 2 mg as directed once a week.     Endocrinology:  Diabetes - GLP-1 Receptor Agonists - semaglutide  Failed - 06/04/2023  4:15 PM      Failed - HBA1C in normal range and within 180 days    Hemoglobin A1C  Date Value Ref Range Status  04/30/2023 11.1 (A) 4.0 - 5.6 % Final   HbA1c, POC (controlled diabetic range)  Date Value Ref Range Status  01/23/2023 10.9 (A) 0.0 - 7.0 % Final         Passed - Cr in normal range and within 360 days    Creat  Date Value Ref Range Status  05/23/2014 0.54 0.50 - 1.10 mg/dL Final   Creatinine, Ser  Date Value Ref  Range Status  05/11/2023 0.61 0.44 - 1.00 mg/dL Final   Creatinine,  Urine  Date Value Ref Range Status  05/23/2014 35.5 mg/dL Final    Comment:    No reference range established.         Passed - Valid encounter within last 6 months    Recent Outpatient Visits           2 days ago Diabetes mellitus treated with oral medication (HCC)   [place] [facility] - A Dept Of [place]. [facility] Collins Dean, NP   3 weeks ago Chronic pain of right knee   [facility] Health Primary Care & Sports Medicine at [facility], Joselyn Nicely, MD   4 weeks ago Type 2 diabetes mellitus with hyperglycemia, unspecified whether long term insulin  use [facility])   [place] [facility] - A Dept Of [place]. [facility] [place], [place], PA-C   1 month ago Diabetes mellitus treated with oral medication [facility])   [place] [facility] - A Dept Of [place]. [facility] Collins Dean, NP   2 months ago Osteoarthritis of right knee, unspecified osteoarthritis type   [facility] at [facility], Joselyn Nicely, MD       Future Appointments             In 2 months Fleming, [name] W, NP [facility] Health Comm [facility] - A Dept Of [place]. [facility]

## 2023-06-04 NOTE — Progress Notes (Unsigned)
 Patient Active Problem List   Diagnosis Date Noted   Otitis media 05/09/2023   Mastoiditis 05/08/2023   Primary hypertension 02/25/2022   Hemorrhoids 02/25/2022   Elevated LDL cholesterol level 09/10/2021   Chronic pain of right knee 03/07/2021   Diabetes mellitus without complication (HCC) 12/29/2020   Recurrent vaginitis 12/29/2020   Dermatitis 03/28/2017   Left lower quadrant pain 03/28/2017   Class 3 severe obesity with serious comorbidity and body mass index (BMI) of 45.0 to 49.9 in adult 03/28/2017    Patient's Medications  New Prescriptions   No medications on file  Previous Medications   ADVIL  200 MG CAPS    Take 2 capsules (400 mg total) by mouth every 6 (six) hours as needed (for pain).   AGAMATRIX ULTRA-THIN LANCETS MISC    Check blood glucose twice daily before meals   ATORVASTATIN  (LIPITOR) 20 MG TABLET    Take 1 tablet (20 mg total) by mouth daily.   BLOOD GLUCOSE MONITORING SUPPL (AGAMATRIX PRESTO) W/DEVICE KIT    Check blood glucose before meals twice daily   BLOOD GLUCOSE MONITORING SUPPL (TRUE METRIX METER) W/DEVICE KIT    Use as instructed. Check blood glucose level by fingerstick twice per day.  E11.65   CIPROFLOXACIN -DEXAMETHASONE  (CIPRODEX ) OTIC SUSPENSION    Place 4 drops into the left ear 2 (two) times daily.   DICLOFENAC  SODIUM (VOLTAREN ) 1 % GEL    Apply 4 g topically 4 (four) times daily. To affected joint.   FLUCONAZOLE  (DIFLUCAN ) 150 MG TABLET    For 1 week, take 150 mg (1 tablet) PO every 72 hours for 3 doses (day 1, 4, and 7). Then take 1 tablet (150 mg) once a week for 3 months until gone.   GLIPIZIDE  (GLUCOTROL ) 10 MG TABLET    Take 1 tablet (10 mg total) by mouth 2 (two) times daily before a meal.   GLUCOSE BLOOD (AGAMATRIX PRESTO TEST) TEST STRIP    Check blood glucose before meals twice daily   GLUCOSE BLOOD (TRUE METRIX BLOOD GLUCOSE TEST) TEST STRIP    Use as instructed. Check blood glucose level by fingerstick twice per day.  E11.65    INSULIN  GLARGINE-YFGN (SEMGLEE ) 100 UNIT/ML INJECTION    Inject 0.1 mLs (10 Units total) into the skin daily.   LORATADINE  (CLARITIN ) 10 MG TABLET    Take 1 tablet (10 mg total) by mouth daily.   MELOXICAM  (MOBIC ) 15 MG TABLET    Take 1 tablet (15 mg total) by mouth daily with a meal for 2 weeks, then once every 24 hours prn pain.   METFORMIN  (GLUCOPHAGE -XR) 500 MG 24 HR TABLET    Take 1 tablet (500 mg total) by mouth 2 (two) times daily with a meal.   SEMAGLUTIDE , 2 MG/DOSE, 8 MG/3ML SOPN    Inject 2 mg as directed once a week.   TRAMADOL  (ULTRAM ) 50 MG TABLET    Take 1 tablet (50 mg total) by mouth every 8 (eight) hours as needed for moderate pain (pain score 4-6).   TRUEPLUS LANCETS 28G MISC    Use as instructed. Check blood glucose level by fingerstick twice per day.  E11.65  Modified Medications   No medications on file  Discontinued Medications   No medications on file    Subjective: 48 YF presents for follow-up of left ear OE/OM +/- mastoiditis. "Left ear OE/OM +/- mastoiditis = can transition to amox/clav 875mg  bid plus levofoxacin 750mg  daily for  7 days. Appears improving as her leukocytosis has improved   Continue with topical cipro /dex drops - please give the hospital bottle to her (generally very expensive as outpatient $$ med)   Can use ibuprofen  at discharge for ear pain   She is not MRSA colonized, so we will d/c vancomycin ; continue with standard precautions  "   Review of Systems: ROS  Past Medical History:  Diagnosis Date   Diabetes mellitus without complication (HCC) 01/14/2014   Elevated LDL cholesterol level    Gestational diabetes    Left-sided back pain 02/25/2022   Obesity    Right knee DJD 03/14/2020   Had injected with Dr. Isabella Mao office.  Reportedly recommended surgery, but could not afford.   S/P C-section 10/18/2014   UTI (lower urinary tract infection)     Social History   Tobacco Use   Smoking status: Never    Passive exposure: Never    Smokeless tobacco: Never  Vaping Use   Vaping status: Never Used  Substance Use Topics   Alcohol use: No   Drug use: No    Family History  Problem Relation Age of Onset   Diabetes Mother    Colitis Mother    Diabetes Sister    Diabetes Brother    Cancer Paternal Grandmother        uterine    No Known Allergies  Health Maintenance  Topic Date Due   COVID-19 Vaccine (2 - Moderna risk series) 04/04/2021   Pneumococcal Vaccine 63-14 Years old (2 of 2 - PCV) 03/07/2022   Colonoscopy  12/06/2023 (Originally 01/27/2021)   INFLUENZA VACCINE  08/15/2023   HEMOGLOBIN A1C  10/30/2023   Diabetic kidney evaluation - Urine ACR  12/06/2023   COLON CANCER SCREENING ANNUAL FOBT  12/09/2023   Diabetic kidney evaluation - eGFR measurement  05/10/2024   Cervical Cancer Screening (HPV/Pap Cotest)  09/10/2024   DTaP/Tdap/Td (3 - Td or Tdap) 10/18/2024   Hepatitis C Screening  Completed   HIV Screening  Completed   HPV VACCINES  Aged Out   Meningococcal B Vaccine  Aged Out   FOOT EXAM  Discontinued   OPHTHALMOLOGY EXAM  Discontinued    Objective:  Vitals:   06/04/23 1459  Weight: 282 lb (127.9 kg)  Height: 5' (1.524 m)   Body mass index is 55.07 kg/m.  Physical Exam Physical Exam   Lab Results Lab Results  Component Value Date   WBC 6.2 05/11/2023   HGB 11.2 (L) 05/11/2023   HCT 33.0 (L) 05/11/2023   MCV 95.4 05/11/2023   PLT 273 05/11/2023    Lab Results  Component Value Date   CREATININE 0.61 05/11/2023   BUN 5 (L) 05/11/2023   NA 132 (L) 05/11/2023   K 3.3 (L) 05/11/2023   CL 97 (L) 05/11/2023   CO2 22 05/11/2023    Lab Results  Component Value Date   ALT 21 05/10/2023   AST 15 05/10/2023   ALKPHOS 57 05/10/2023   BILITOT 1.2 05/10/2023    Lab Results  Component Value Date   CHOL 132 06/06/2022   HDL 60 06/06/2022   LDLCALC 54 06/06/2022   TRIG 95 06/06/2022   CHOLHDL 3.5 12/17/2017   Lab Results  Component Value Date   LABRPR Non Reactive  10/17/2014   No results found for: "HIV1RNAQUANT", "HIV1RNAVL", "CD4TABS"   Problem List Items Addressed This Visit   None  Results   Assessment/Plan #Left ear OE/OM +/- mastoiditis  Pt on Levaquin  and augmentin   since last seen by ID on 05/10/23 Seen by ent-> tm perf resolved. No effusion on 06/03/23 Pt has no complaints today Stop abx  (she had extra abx left over so she continued it)as infection appears to have been resolved and has been on abx since discharge F/u with id prn   Orlie Bjornstad, MD [facility] for Infectious Disease [facility] 06/04/2023, 3:01 PM   I have personally spent 45 minutes involved in face-to-face and non-face-to-face activities for this patient on the day of the visit. Professional time spent includes the following activities: Preparing to see the patient (review of tests), Obtaining and/or reviewing separately obtained history (admission/discharge record), Performing a medically appropriate examination and/or evaluation , Ordering medications/tests/procedures, referring and communicating with other health care professionals, Documenting clinical information in the EMR, Independently interpreting results (not separately reported), Communicating results to the patient/family/caregiver, Counseling and educating the patient/family/caregiver and Care coordination (not separately reported).

## 2023-06-04 NOTE — Telephone Encounter (Signed)
 Rx 06/02/23 3ml 6RF- duplicate request Requested Prescriptions  Pending Prescriptions Disp Refills   insulin  glargine-yfgn (SEMGLEE ) 100 UNIT/ML injection 10 mL 11    Sig: Inject 0.1 mLs (10 Units total) into the skin daily.     Off-Protocol Failed - 06/04/2023  4:15 PM      Failed - Medication not assigned to a protocol, review manually.      Passed - Valid encounter within last 12 months    Recent Outpatient Visits           2 days ago Diabetes mellitus treated with oral medication (HCC)   [place] [facility] - A Dept Of [place]. [facility] Collins Dean, NP   3 weeks ago Chronic pain of right knee   [facility] Health Primary Care & Sports Medicine at [facility], Joselyn Nicely, MD   4 weeks ago Type 2 diabetes mellitus with hyperglycemia, unspecified whether long term insulin  use [facility])   [place] [facility] - A Dept Of [place]. [facility] [place], [place], PA-C   1 month ago Diabetes mellitus treated with oral medication [facility])   [place] [facility] - A Dept Of [place]. [facility] Collins Dean, NP   2 months ago Osteoarthritis of right knee, unspecified osteoarthritis type   [facility] Primary Care & Sports Medicine at [facility], Joselyn Nicely, MD       Future Appointments             In 2 months Fleming, [name] W, NP [facility] Comm [facility]ien Grout - A Dept Of [place]. [facility]            Refused Prescriptions Disp Refills   Semaglutide , 2 MG/DOSE, 8 MG/3ML SOPN 3 mL 6    Sig: Inject 2 mg as directed once a week.     Endocrinology:  Diabetes - GLP-1 Receptor Agonists - semaglutide  Failed - 06/04/2023  4:15 PM      Failed - HBA1C in normal range and within 180 days    Hemoglobin A1C  Date Value Ref Range Status  04/30/2023 11.1 (A) 4.0 - 5.6 % Final   HbA1c, POC (controlled diabetic range)  Date Value Ref Range Status  01/23/2023 10.9 (A) 0.0  - 7.0 % Final         Passed - Cr in normal range and within 360 days    Creat  Date Value Ref Range Status  05/23/2014 0.54 0.50 - 1.10 mg/dL Final   Creatinine, Ser  Date Value Ref Range Status  05/11/2023 0.61 0.44 - 1.00 mg/dL Final   Creatinine, Urine  Date Value Ref Range Status  05/23/2014 35.5 mg/dL Final    Comment:    No reference range established.         Passed - Valid encounter within last 6 months    Recent Outpatient Visits           2 days ago Diabetes mellitus treated with oral medication (HCC)   [place] [facility] - A Dept Of [place]. [facility] Collins Dean, NP   3 weeks ago Chronic pain of right knee   [facility] Health Primary Care & Sports Medicine at [facility], Joselyn Nicely, MD   4 weeks ago Type 2 diabetes mellitus with hyperglycemia, unspecified whether long term insulin  use [facility])   [place] [facility]out - A Dept Of  [place]. [facility] [place], [place] [facility], PA-C   1 month ago Diabetes mellitus treated with oral medication [facility])   [place] [facility]out - A Dept Of [place]. [facility] Collins Dean, NP   2 months ago Osteoarthritis of right knee, unspecified osteoarthritis type   [facility] Primary Care & Sports Medicine at [facility], Joselyn Nicely, MD       Future Appointments             In 2 months Fleming, [name] W, NP [facility]ien Grout - A Dept Of [place]. [facility]

## 2023-06-12 ENCOUNTER — Ambulatory Visit: Payer: Self-pay | Attending: Nurse Practitioner | Admitting: Pharmacist

## 2023-06-12 ENCOUNTER — Other Ambulatory Visit: Payer: Self-pay

## 2023-06-12 ENCOUNTER — Encounter: Payer: Self-pay | Admitting: Pharmacist

## 2023-06-12 DIAGNOSIS — Z794 Long term (current) use of insulin: Secondary | ICD-10-CM

## 2023-06-12 DIAGNOSIS — Z7984 Long term (current) use of oral hypoglycemic drugs: Secondary | ICD-10-CM

## 2023-06-12 DIAGNOSIS — E11649 Type 2 diabetes mellitus with hypoglycemia without coma: Secondary | ICD-10-CM

## 2023-06-12 DIAGNOSIS — E7800 Pure hypercholesterolemia, unspecified: Secondary | ICD-10-CM

## 2023-06-12 DIAGNOSIS — E1165 Type 2 diabetes mellitus with hyperglycemia: Secondary | ICD-10-CM

## 2023-06-12 DIAGNOSIS — E119 Type 2 diabetes mellitus without complications: Secondary | ICD-10-CM

## 2023-06-12 DIAGNOSIS — Z7985 Long-term (current) use of injectable non-insulin antidiabetic drugs: Secondary | ICD-10-CM

## 2023-06-12 MED ORDER — TRUEPLUS LANCETS 28G MISC
3 refills | Status: AC
  Filled 2023-06-12: qty 100, fill #0
  Filled 2023-10-13: qty 100, 50d supply, fill #0

## 2023-06-12 MED ORDER — TRUE METRIX BLOOD GLUCOSE TEST VI STRP
ORAL_STRIP | 12 refills | Status: AC
  Filled 2023-06-12: qty 100, fill #0
  Filled 2023-10-13: qty 100, 50d supply, fill #0

## 2023-06-12 MED ORDER — GLIPIZIDE 5 MG PO TABS
5.0000 mg | ORAL_TABLET | Freq: Two times a day (BID) | ORAL | 3 refills | Status: AC
  Filled 2023-06-12: qty 60, 30d supply, fill #0
  Filled 2023-10-13: qty 60, 30d supply, fill #1

## 2023-06-12 MED ORDER — SEMAGLUTIDE (1 MG/DOSE) 4 MG/3ML ~~LOC~~ SOPN
1.0000 mg | PEN_INJECTOR | SUBCUTANEOUS | 2 refills | Status: DC
  Filled 2023-06-12: qty 3, 28d supply, fill #0

## 2023-06-12 MED ORDER — ATORVASTATIN CALCIUM 20 MG PO TABS
20.0000 mg | ORAL_TABLET | Freq: Every day | ORAL | 1 refills | Status: DC
  Filled 2023-06-12: qty 90, 90d supply, fill #0
  Filled 2023-09-05 (×2): qty 90, 90d supply, fill #1

## 2023-06-12 MED ORDER — PEN NEEDLES 32G X 4 MM MISC
2 refills | Status: AC
  Filled 2023-06-12: qty 100, 100d supply, fill #0
  Filled 2023-11-26: qty 100, 100d supply, fill #1

## 2023-06-12 MED ORDER — BASAGLAR KWIKPEN 100 UNIT/ML ~~LOC~~ SOPN
10.0000 [IU] | PEN_INJECTOR | Freq: Every day | SUBCUTANEOUS | 2 refills | Status: DC
  Filled 2023-06-12: qty 3, 28d supply, fill #0

## 2023-06-12 MED ORDER — TRUE METRIX METER W/DEVICE KIT
PACK | 0 refills | Status: AC
  Filled 2023-06-12: qty 1, 30d supply, fill #0
  Filled 2023-06-12: qty 1, fill #0

## 2023-06-12 MED ORDER — METFORMIN HCL ER 500 MG PO TB24
1000.0000 mg | ORAL_TABLET | Freq: Two times a day (BID) | ORAL | 1 refills | Status: DC
Start: 2023-06-12 — End: 2023-10-23
  Filled 2023-06-12: qty 360, 90d supply, fill #0
  Filled 2023-09-05: qty 360, 90d supply, fill #1

## 2023-06-12 NOTE — Progress Notes (Signed)
 S:     No chief complaint on file.  48 y.o. female who presents for diabetes evaluation, education, and management. Patient arrives in good spirits and presents with her daughter who wishes to serve as her interpreter. PMH is significant for T2DM initially diagnosed as GDM ~8-9 years ago, HTN, OA, obesity, hyperlipidemia. No known history of ASCVD, CKD, or CHF. No pancreatitis or thyroid cancer history.   Patient was originally referred to me by, Kayla Scott, on 12/06/2022. Thereafter, she saw me once on 01/23/2023 and was lost to follow-up.   Since then, she was seen back in clinic here twice. Once on 04/30/23 and then again on 05/07/23 by her PCP and our PA respectively. After those visits, she was [facility] to [facility]/24 - 05/11/2023 with L otitis media with mastoiditis complicated by perforated tempanic membrane. ID and ENT consulted. Pt was treated initially with IV antibiotics and discharged on home antibiotics. Of note, her metformin  was decreased to 500 mg BID and insulin  was added. Since that visit, she saw Kayla Scott 06/02/2023. She has also been following with ENT and ID as an outpatient. Most recent ID visit was on 06/04/2023 - completed antibiotics with infection resolution.    Today, pt reports improved jaw pain. Some popping in her ear but no ear pain. Overall, she feels a lot better since her last visit. She endorses adherence to Lantus  10 units daily, Ozempic  1 mg weekly (Wednesday), glipizide  10 mg BID, metformin  500 mg XR BID. Some mild nausea for 1-2 days after Ozempic  injection but no vomiting, abdominal pain, or diarrhea. Denies any change in vision. No polyuria.   Family/Social History:  Fhx: DM Tobacco: never smoker  Alcohol: none reported   Current diabetes medications include: glipizide  10 mg BID, metformin  500 mg XR BID, Ozempic  1 mg weekly, Lantus  10units daily  Current hyperlipidemia medications include: atorvastatin  20 mg daily  Patient reports adherence to taking all  medications as prescribed.   Insurance coverage: none  Patient reports hypoglycemic events. Brings in her phone and shows me pictures of her glucometer. Over the last 14 days, she has several objective values in the 60s and 70s. Describes symptoms as shaking and sweating.    Reported home fasting blood sugars: 63 - 114. 76 this morning.    Patient denies polyuria. Patient reports neuropathy (nerve pain). Patient denies visual changes. Patient reports self foot exams.   Patient reported dietary habits: Eats 3 meals/day -Tries to limit tortillas and soda -Tries to limit sugary foods overall.  -Snacks now on fruit.  Patient-reported exercise habits:  -None outside of work. Limited by her OA/bilateral knee pain.    O:   Lab Results  Component Value Date   HGBA1C 11.1 (A) 04/30/2023   There were no vitals filed for this visit.  Lipid Panel     Component Value Date/Time   CHOL 132 06/06/2022 0821   TRIG 95 06/06/2022 0821   HDL 60 06/06/2022 0821   CHOLHDL 3.5 12/17/2017 0843   CHOLHDL 3.2 Ratio 03/23/2007 2048   VLDL 13 03/23/2007 2048   LDLCALC 54 06/06/2022 0821    Clinical Atherosclerotic Cardiovascular Disease (ASCVD): No  The 10-year ASCVD risk score (Arnett DK, et al., 2019) is: 0.6%   Values used to calculate the score:     Age: 41 years     Sex: Female     Is Non-Hispanic African American: No     Diabetic: Yes     Tobacco smoker: No  Systolic Blood Pressure: 104 mmHg     Is BP treated: No     HDL Cholesterol: 60 mg/dL     Total Cholesterol: 132 mg/dL   Patient is participating in a Managed Medicaid Plan: No    A/P: Diabetes longstanding currently uncontrolled. A1c in April was 11.1%, however, her home sugars are at goal to low. She is experiencing hypoglycemia at home. Patient is able to verbalize appropriate hypoglycemia management plan. We will decrease glipizide  dose with the goal of coming off of this completely. I will continue Lantus , Ozempic , as  prescribed. She denies any GI side effects with the higher doses of metformin  and her renal function looks good. We will increase her metformin  back to 1000 mg BID. Medication adherence appears to be appropriate. -Increased dose of metformin  500 mg XR BID to 1000 mg XR BID. -Decreased dose of glipizide  to 5 mg BID.  -Continued Lantus  10 units BID. Resent as Basaglar  to our pharmacy for cost benefit.  -Continued Ozempic  1 mg weekly.  -Patient educated on purpose, proper use, and potential adverse effects of Ozempic .  -Extensively discussed pathophysiology of diabetes, recommended lifestyle interventions, dietary effects on blood sugar control.  -Counseled on s/sx of and management of hypoglycemia.  -Next A1c anticipated 07/2023.   Written patient instructions provided. Patient verbalized understanding of treatment plan.  Total time in face to face counseling 45 minutes.    Follow-up:  Pharmacist in 1 month.  Kayla Scott, PharmD, Kayla Scott, CPP Clinical Pharmacist [facility] [phone]

## 2023-06-19 ENCOUNTER — Telehealth: Payer: Self-pay

## 2023-06-19 NOTE — Telephone Encounter (Signed)
 Patient informed of form being redone and ready for pickup.

## 2023-06-27 ENCOUNTER — Telehealth: Payer: Self-pay | Admitting: Nurse Practitioner

## 2023-06-27 NOTE — Telephone Encounter (Signed)
 1st attempt contacted pt left vm to resch appt

## 2023-07-14 ENCOUNTER — Ambulatory Visit: Payer: Self-pay | Admitting: Pharmacist

## 2023-07-28 NOTE — Progress Notes (Unsigned)
 Ellouise Console, PA-C [address] [place], [place]  [address] Phone: [phone]   Gastroenterology Consultation  Referring Provider:     [name] Haze ORN, NP Primary Care Physician:  [name] Haze ORN, NP Primary Gastroenterologist:  Ellouise Console, PA-C / Glendia Holt, MD  Reason for Consultation:     Blood in stool, discuss colonoscopy        HPI:   Kayla Scott is a 48 y.o. y/o female referred for consultation & management  by [name] Haze ORN, NP.  Here today with interpreter from [facility] language services, interpreting in Spanish.  New patient.  Referred to evaluate blood in stool and discuss colonoscopy.  Patient sees occasional mild intermittent bright red blood in her stools for many months.  She admits to chronic constipation.  Has episodes of rectal burning and rectal pain after having a hard bowel movement.  Tried MiraLAX sporadically in the past.  No consistent treatment.  Of  Patient has never had a colonoscopy.  No previous GI evaluation.  She has obesity with BMI of 53.  Family history of colon cancer: Grandmother  05/11/2023 labs: Mild anemia with Hgb 11.2, HCT 33, MCV 95.  Low magnesium  1.5.  Low sodium 132, potassium 3.3, chloride 97.  Elevated glucose 218.  BUN 5, creatinine 0.61, GFR greater than 60.  06/2022 CT abdomen pelvis with contrast: 1. No acute CT findings of the abdomen or pelvis to explain left lower quadrant pain. 2. Hepatic steatosis. 3. No fracture or dislocation of the lumbar spine. Minimal lumbar endplate osteophytosis without significant disc space height loss.  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 Marinell office.  Reportedly recommended surgery, but could not afford.   S/P C-section 10/18/2014   UTI (lower  urinary tract infection)     Past Surgical History:  Procedure Laterality Date   CESAREAN SECTION N/A 11/09/1998   [place]   CESAREAN SECTION N/A 07/24/2001   [facility].  Premature delivery due to placenta previa   CESAREAN SECTION WITH BILATERAL TUBAL LIGATION Bilateral 10/18/2014   Procedure: REPEAT CESAREAN SECTION WITH BILATERAL TUBAL LIGATION;  Surgeon: Lynwood KANDICE Solomons, MD;  Location: [facility];  Service: Obstetrics;  Laterality: Bilateral;    Prior to Admission medications   Medication Sig Start Date End Date Taking? Authorizing Provider  ADVIL  200 MG CAPS Take 2 capsules (400 mg total) by mouth every 6 (six) hours as needed (for pain). 05/11/23   Shahmehdi, Adriana LABOR, MD  atorvastatin  (LIPITOR) 20 MG tablet Take 1 tablet (20 mg total) by mouth daily. 06/12/23   Newlin, Enobong, MD  Blood Glucose Monitoring Suppl (TRUE METRIX METER) w/Device KIT Use as instructed. Check blood glucose level by fingerstick twice per day.  E11.65 06/12/23   Newlin, Enobong, MD  diclofenac  Sodium (VOLTAREN ) 1 % GEL Apply 4 g topically 4 (four) times daily. To affected joint. 05/12/23   Curtis Debby PARAS, MD  glipiZIDE  (GLUCOTROL ) 5 MG tablet Take 1 tablet (5 mg total) by mouth 2 (two) times daily before a meal. 06/12/23   Newlin, Enobong, MD  glucose blood (TRUE METRIX BLOOD GLUCOSE TEST) test strip Use as instructed. Check blood glucose level by fingerstick twice per day.  E11.65 06/12/23   Delbert Clam, MD  Insulin  Glargine (BASAGLAR  KWIKPEN) 100 UNIT/ML Inject 10 Units into the skin daily. 06/12/23   Newlin, Enobong, MD  Insulin  Pen Needle (PEN NEEDLES) 32G X 4 MM MISC Use to inject Basaglar  once daily. 06/12/23   Newlin, Enobong, MD  loratadine  (CLARITIN ) 10 MG tablet Take 1 tablet (10 mg total) by mouth daily. 05/11/23   ShahmehdiAdriana LABOR, MD  meloxicam  (MOBIC ) 15 MG tablet Take 1 tablet (15 mg total) by mouth daily with a meal for 2 weeks, then once every 24 hours prn pain. Patient taking differently: Take 15  mg by mouth daily as needed for pain. 03/31/23   Curtis Debby PARAS, MD  metFORMIN  (GLUCOPHAGE -XR) 500 MG 24 hr tablet Take 2 tablets (1,000 mg total) by mouth 2 (two) times daily with a meal. 06/12/23   Newlin, Enobong, MD  Semaglutide , 1 MG/DOSE, 4 MG/3ML SOPN Inject 1 mg as directed once a week. 06/12/23   Newlin, Enobong, MD  traMADol  (ULTRAM ) 50 MG tablet Take 1 tablet (50 mg total) by mouth every 8 (eight) hours as needed for moderate pain (pain score 4-6). 05/12/23   Curtis Debby PARAS, MD  TRUEplus Lancets 28G MISC Use as instructed. Check blood glucose level by fingerstick twice per day.  E11.65 06/12/23   Delbert Clam, MD    Family History  Problem Relation Age of Onset   Diabetes Mother    Colitis Mother    Diabetes Sister    Diabetes Brother    Colon cancer Paternal Grandmother      Social History   Tobacco Use   Smoking status: Never    Passive exposure: Never   Smokeless tobacco: Never  Vaping Use   Vaping status: Never Used  Substance Use Topics   Alcohol use: No   Drug use: No    Allergies as of 07/29/2023   (No Known Allergies)    Review of Systems:    All systems reviewed and negative except where noted in HPI.   Physical Exam:  BP 118/82   Pulse 97   Ht 5' (1.524 m)   Wt 275 lb 4 oz (124.9 kg)   LMP 05/02/2020   SpO2 100%   BMI 53.76 kg/m  Patient's last menstrual period was 05/02/2020.  General:   Alert,  Well-developed, obese, pleasant and cooperative in NAD Lungs:  Respirations even and unlabored.  Clear throughout to auscultation.   No wheezes, crackles, or rhonchi. No acute distress. Heart:  Regular rate and rhythm; no murmurs, clicks, rubs, or gallops. Abdomen:  Normal bowel sounds.  No bruits.  Soft, and obese without masses, hepatosplenomegaly or hernias noted.  No Tenderness.  No guarding or rebound tenderness.    Rectal: No external hemorrhoids.  No tenderness.  No masses, rashes, or fissures. Neurologic:  Alert and oriented x3;   grossly normal neurologically. Psych:  Alert and cooperative. Normal mood and affect.  Chaperone for Exam:  Marylynn Moats, CMA   Imaging Studies: No results found.  Labs: CBC    Component Value Date/Time   WBC 6.2 05/11/2023 0739   RBC 3.46 (L) 05/11/2023 0739   HGB 11.2 (L) 05/11/2023 0739   HGB 13.7 06/06/2022 0821   HGB 12.7 05/17/2014 0000   HCT 33.0 (L) 05/11/2023 0739   HCT 40.8 06/06/2022 0821   HCT 40 05/17/2014 0000   PLT 273 05/11/2023 0739   PLT 279 06/06/2022 0821   PLT 277 05/17/2014 0000   MCV 95.4 05/11/2023 0739   MCV 99 (H) 06/06/2022 9178  MCH 32.4 05/11/2023 0739   MCHC 33.9 05/11/2023 0739   RDW 12.6 05/11/2023 0739   RDW 12.3 06/06/2022 0821   LYMPHSABS 2.3 05/08/2023 0833   LYMPHSABS 3.2 (H) 06/06/2022 0821   MONOABS 0.9 05/08/2023 0833   EOSABS 0.1 05/08/2023 0833   EOSABS 0.1 06/06/2022 0821   BASOSABS 0.0 05/08/2023 0833   BASOSABS 0.1 06/06/2022 0821    CMP     Component Value Date/Time   NA 132 (L) 05/11/2023 0739   NA 142 12/06/2022 1535   K 3.3 (L) 05/11/2023 0739   CL 97 (L) 05/11/2023 0739   CO2 22 05/11/2023 0739   GLUCOSE 218 (H) 05/11/2023 0739   BUN 5 (L) 05/11/2023 0739   BUN 20 12/06/2022 1535   CREATININE 0.61 05/11/2023 0739   CREATININE 0.54 05/23/2014 1315   CALCIUM  8.0 (L) 05/11/2023 0739   PROT 6.0 (L) 05/10/2023 0538   PROT 7.1 12/06/2022 1535   ALBUMIN 2.2 (L) 05/10/2023 0538   ALBUMIN 4.0 12/06/2022 1535   AST 15 05/10/2023 0538   ALT 21 05/10/2023 0538   ALKPHOS 57 05/10/2023 0538   BILITOT 1.2 05/10/2023 0538   BILITOT 0.3 12/06/2022 1535   GFRNONAA >60 05/11/2023 0739   GFRAA 129 12/17/2017 0843    Assessment and Plan:   Manasvi Scott is a 48 y.o. y/o female has been referred for:  1.  Blood in stool: Normal Rectal Exam Today.  No previous Colonoscopy.  Family history of grandmother who had colon cancer. - Scheduling Colonoscopy (schedule in hospital due to BMI greater than 50) I  discussed risks of colonoscopy with patient to include risk of bleeding, colon perforation, and risk of sedation.  Patient expressed understanding and agrees to proceed with colonoscopy.   2.  Constipation - Instructed patient to take MiraLAX 17 g (1 capful) and a drink every day consistently long-term.  Increase to 2 capfuls daily if needed. - - Recommend High Fiber diet with fruits, vegetables, and whole grains. - Drink 64 ounces of Fluids Daily.   3.  Anemia unspecified - Labs: CBC, iron panel, ferritin, B12, folate  4.  Electrolyte abnormalities - Lab: BMP, magnesium   5.  Morbid obesity, BMI 53 - Schedule colonoscopy in hospital due to BMI greater than 50.  Follow up 4 weeks after colonoscopy with TG.  Ellouise Console, PA-C

## 2023-07-29 ENCOUNTER — Ambulatory Visit (INDEPENDENT_AMBULATORY_CARE_PROVIDER_SITE_OTHER): Payer: Self-pay | Admitting: Physician Assistant

## 2023-07-29 ENCOUNTER — Encounter: Payer: Self-pay | Admitting: Gastroenterology

## 2023-07-29 ENCOUNTER — Other Ambulatory Visit (INDEPENDENT_AMBULATORY_CARE_PROVIDER_SITE_OTHER): Payer: Self-pay

## 2023-07-29 ENCOUNTER — Other Ambulatory Visit: Payer: Self-pay

## 2023-07-29 ENCOUNTER — Telehealth: Payer: Self-pay | Admitting: Physician Assistant

## 2023-07-29 VITALS — BP 118/82 | HR 97 | Ht 60.0 in | Wt 275.2 lb

## 2023-07-29 DIAGNOSIS — D649 Anemia, unspecified: Secondary | ICD-10-CM

## 2023-07-29 DIAGNOSIS — K625 Hemorrhage of anus and rectum: Secondary | ICD-10-CM

## 2023-07-29 DIAGNOSIS — K5900 Constipation, unspecified: Secondary | ICD-10-CM

## 2023-07-29 DIAGNOSIS — Z6841 Body Mass Index (BMI) 40.0 and over, adult: Secondary | ICD-10-CM

## 2023-07-29 DIAGNOSIS — E876 Hypokalemia: Secondary | ICD-10-CM

## 2023-07-29 DIAGNOSIS — E878 Other disorders of electrolyte and fluid balance, not elsewhere classified: Secondary | ICD-10-CM

## 2023-07-29 LAB — CBC WITH DIFFERENTIAL/PLATELET
Basophils Absolute: 0 K/uL (ref 0.0–0.1)
Basophils Relative: 0.2 % (ref 0.0–3.0)
Eosinophils Absolute: 0 K/uL (ref 0.0–0.7)
Eosinophils Relative: 0.1 % (ref 0.0–5.0)
HCT: 42.8 % (ref 36.0–46.0)
Hemoglobin: 14.3 g/dL (ref 12.0–15.0)
Lymphocytes Relative: 15.9 % (ref 12.0–46.0)
Lymphs Abs: 1.1 K/uL (ref 0.7–4.0)
MCHC: 33.5 g/dL (ref 30.0–36.0)
MCV: 95.4 fl (ref 78.0–100.0)
Monocytes Absolute: 0.4 K/uL (ref 0.1–1.0)
Monocytes Relative: 6.2 % (ref 3.0–12.0)
Neutro Abs: 5.4 K/uL (ref 1.4–7.7)
Neutrophils Relative %: 77.6 % — ABNORMAL HIGH (ref 43.0–77.0)
Platelets: 221 K/uL (ref 150.0–400.0)
RBC: 4.48 Mil/uL (ref 3.87–5.11)
RDW: 14.6 % (ref 11.5–15.5)
WBC: 6.9 K/uL (ref 4.0–10.5)

## 2023-07-29 LAB — BASIC METABOLIC PANEL WITH GFR
BUN: 22 mg/dL (ref 6–23)
CO2: 26 meq/L (ref 19–32)
Calcium: 10 mg/dL (ref 8.4–10.5)
Chloride: 99 meq/L (ref 96–112)
Creatinine, Ser: 0.76 mg/dL (ref 0.40–1.20)
GFR: 93.26 mL/min (ref >60.00)
Glucose, Bld: 495 mg/dL — ABNORMAL HIGH (ref 70–99)
Potassium: 3.8 meq/L (ref 3.5–5.1)
Sodium: 133 meq/L — ABNORMAL LOW (ref 135–145)

## 2023-07-29 LAB — B12 AND FOLATE PANEL
Folate: 16 ng/mL (ref >5.9)
Vitamin B-12: 360 pg/mL (ref 211–911)

## 2023-07-29 LAB — IRON,TIBC AND FERRITIN PANEL
%SAT: 26 % (ref 16–45)
Ferritin: 201 ng/mL (ref 16–232)
Iron: 68 ug/dL (ref 40–190)
TIBC: 265 ug/dL (ref 250–450)

## 2023-07-29 MED ORDER — NA SULFATE-K SULFATE-MG SULF 17.5-3.13-1.6 GM/177ML PO SOLN
1.0000 | Freq: Once | ORAL | 0 refills | Status: AC
  Filled 2023-07-29 (×2): qty 354, 1d supply, fill #0

## 2023-07-29 NOTE — Patient Instructions (Addendum)
 Your provider has requested that you go to the basement level for lab work before leaving today. Press B on the elevator. The lab is located at the first door on the left as you exit the elevator.  - Start OTC Miralax Powder; Mix 1 capful in 6 to 8 ounces of a drink once daily - Recommend high-fiber diet, 30 g of fiber daily - Eat fruits, vegetables, and whole grains - Drink 64 ounces of water / fluids daily.    Please follow up sooner if symptoms increase or worsen  Due to recent changes in healthcare laws, you may see the results of your imaging and laboratory studies on MyChart before your provider has had a chance to review them.  We understand that in some cases there may be results that are confusing or concerning to you. Not all laboratory results come back in the same time frame and the provider may be waiting for multiple results in order to interpret others.  Please give us  48 hours in order for your provider to thoroughly review all the results before contacting the office for clarification of your results.   Thank you for trusting me with your gastrointestinal care!   Ellouise Console, PA-C _______________________________________________________  If your blood pressure at your visit was 140/90 or greater, please contact your primary care physician to follow up on this.  _______________________________________________________  If you are age 26 or older, your body mass index should be between 23-30. Your Body mass index is 53.76 kg/m. If this is out of the aforementioned range listed, please consider follow up with your Primary Care Provider.  If you are age 8 or younger, your body mass index should be between 19-25. Your Body mass index is 53.76 kg/m. If this is out of the aformentioned range listed, please consider follow up with your Primary Care Provider.   ________________________________________________________  The [facility] providers would like to encourage you to use  MYCHART to communicate with providers for non-urgent requests or questions.  Due to long hold times on the telephone, sending your provider a message by [facility] may be a faster and more efficient way to get a response.  Please allow 48 business hours for a response.  Please remember that this is for non-urgent requests.  _______________________________________________________

## 2023-07-29 NOTE — Telephone Encounter (Signed)
 When I was reviewing patient's chart and finishing her note, I realized her BMI is 53.  Unfortunately, we cannot do her procedure in [facility].  Colonoscopy Has to be done in hospital due to BMI greater than 50.  Please cancel colonoscopy in [facility].  Please schedule next available colonoscopy in hospital to evaluate rectal bleeding.  Call and notify patient.  Also postpone follow-up office visit with me until 1 month after colonoscopy.  She needs Spanish interpreter.  I apologize for overlooking this detail. Ellouise Console, PA-C

## 2023-07-30 ENCOUNTER — Ambulatory Visit: Payer: Self-pay | Admitting: Physician Assistant

## 2023-07-30 ENCOUNTER — Ambulatory Visit: Payer: Self-pay | Admitting: Nurse Practitioner

## 2023-07-30 NOTE — Progress Notes (Signed)
 Spanish Speaking (needs interpreter) Patient (Self Pay); Notify patient labs show: Blood sugar 495 is extremely high!  She needs to follow-up with PCP ASAP to get blood sugars under better control.  We need a recent hemoglobin A1c result.  If patient is feeling poorly, then she should go to the ED.  Need to postpone colonoscopy until blood sugars under better control. Kidney function is normal. Normal CBC.  White count and hemoglobin are normal.  No evidence of infection or anemia.  Iron, B12, and folate are normal. **Note: Patient's BMI is 53.  Need to cancel colonoscopy which was scheduled in [facility].  Colonoscopy has to be scheduled in hospital due to BMI greater than 50. Patient is supposed to come to our office today to pick up a sample prep.  We will try to explain all this information to her when she comes to our office and we can use a [employer] interpreter. Ellouise Console, PA-C

## 2023-08-10 NOTE — Progress Notes (Signed)
 Agree with the assessment and plan as outlined by Brigitte Canard, PA-C.

## 2023-08-11 ENCOUNTER — Ambulatory Visit: Payer: Self-pay | Admitting: Nurse Practitioner

## 2023-08-12 ENCOUNTER — Telehealth: Payer: Self-pay | Admitting: Nurse Practitioner

## 2023-08-12 NOTE — Telephone Encounter (Signed)
 Called patient to confirm upcoming appointment 08/13/2023 at 9:30 am. Patient appointment has been successfully confirmed

## 2023-08-13 ENCOUNTER — Ambulatory Visit: Payer: Self-pay | Attending: Nurse Practitioner | Admitting: Nurse Practitioner

## 2023-08-13 ENCOUNTER — Encounter: Payer: Self-pay | Admitting: Nurse Practitioner

## 2023-08-13 ENCOUNTER — Other Ambulatory Visit: Payer: Self-pay

## 2023-08-13 VITALS — BP 102/68 | HR 92 | Resp 19 | Ht 60.0 in | Wt 277.8 lb

## 2023-08-13 DIAGNOSIS — E1165 Type 2 diabetes mellitus with hyperglycemia: Secondary | ICD-10-CM

## 2023-08-13 DIAGNOSIS — H1131 Conjunctival hemorrhage, right eye: Secondary | ICD-10-CM

## 2023-08-13 DIAGNOSIS — Z7985 Long-term (current) use of injectable non-insulin antidiabetic drugs: Secondary | ICD-10-CM

## 2023-08-13 DIAGNOSIS — H5711 Ocular pain, right eye: Secondary | ICD-10-CM

## 2023-08-13 DIAGNOSIS — Z794 Long term (current) use of insulin: Secondary | ICD-10-CM

## 2023-08-13 DIAGNOSIS — Z7984 Long term (current) use of oral hypoglycemic drugs: Secondary | ICD-10-CM

## 2023-08-13 LAB — POCT GLYCOSYLATED HEMOGLOBIN (HGB A1C): Hemoglobin A1C: 11.8 % — AB (ref 4.0–5.6)

## 2023-08-13 MED ORDER — BASAGLAR KWIKPEN 100 UNIT/ML ~~LOC~~ SOPN
20.0000 [IU] | PEN_INJECTOR | Freq: Every day | SUBCUTANEOUS | 3 refills | Status: AC
  Filled 2023-08-13: qty 6, 30d supply, fill #0
  Filled 2023-09-05: qty 6, 30d supply, fill #1
  Filled 2023-10-13: qty 6, 30d supply, fill #2
  Filled 2024-01-14 (×2): qty 6, 30d supply, fill #3
  Filled 2024-02-09: qty 6, 30d supply, fill #4

## 2023-08-13 MED ORDER — SEMAGLUTIDE (1 MG/DOSE) 4 MG/3ML ~~LOC~~ SOPN
1.0000 mg | PEN_INJECTOR | SUBCUTANEOUS | 2 refills | Status: DC
  Filled 2023-08-13: qty 3, 28d supply, fill #0
  Filled 2023-09-05: qty 3, 28d supply, fill #1
  Filled 2023-10-13: qty 3, 28d supply, fill #2

## 2023-08-13 NOTE — Progress Notes (Signed)
 Right eye after vessel burst last Saturday.  Pain present last Saturday and last night.

## 2023-08-13 NOTE — Progress Notes (Signed)
 Assessment & Plan:  Kayla Scott was seen today for diabetes.  Diagnoses and all orders for this visit:  Type 2 diabetes mellitus with hyperglycemia, unspecified whether long term insulin  use (HCC) -     POCT glycosylated hemoglobin (Hb A1C) -     Insulin  Glargine (BASAGLAR  KWIKPEN) 100 UNIT/ML; Inject 20 Units into the skin daily. -     Semaglutide , 1 MG/DOSE, 4 MG/3ML SOPN; Inject 1 mg as directed once a week. FOR WEIGHT LOSS AND DIABETES -     Ambulatory referral to Ophthalmology Elevated hemoglobin A1c at 11.8% indicates poor glycemic control. Confusion with medication administration, including insulin  and Ozempic . Experienced anxiety and increased appetite with Ozempic , leading to discontinuation. Current regimen includes metformin  and insulin , but requires adjustment for better control. - Increase long-acting insulin  to 20 units once daily. - Add short-acting insulin , 5 units before each meal. - Restart Ozempic  once weekly; monitor for anxiety and increased appetite. - Adjust metformin  to 2 tablets twice daily.  Eye pain and recent subconjunctival hemorrhage Reported eye pain and subconjunctival hemorrhage with associated headache. Requires evaluation for potential diabetic retinopathy due to uncontrolled diabetes. - Refer to eye specialist for evaluation of diabetic retinopathy. - Advise to continue seeing eye doctor at [facility] for routine exams if she can not afford to see retinal specialist.   Patient has been counseled on age-appropriate routine health concerns for screening and prevention. These are reviewed and up-to-date. Referrals have been placed accordingly. Immunizations are up-to-date or declined.    Subjective:   Chief Complaint  Patient presents with   Diabetes    History of Present Illness  Kayla Scott is a 48 year old female with diabetes who presents with elevated hemoglobin A1c and medication management issues.  She has a past medical history of  DM2 (01/14/2014), Elevated LDL cholesterol level, Gestational diabetes, Left-sided back pain (02/25/2022), Obesity, Right knee DJD (03/14/2020), S/P C-section (10/18/2014), and UTI     DM 2 Her hemoglobin A1c is currently 11.8. She has been taking two metformin  tablets in the morning and two at night, along with 10 mg of insulin  daily. She is currently using vial insuin that was given to her after being treated in the hospital and she never  picked up the insulin  pen or the ozempic  pen that was sent to the pharmacy last month. Her daughter assists with reading medication instructions, as she is not fluent in [place]. She has been using syringes obtained from [facility], initially prescribed during a hospital visit. Lab Results  Component Value Date   HGBA1C 11.8 (A) 08/13/2023     She describes an episode on August 02, 2023, where her right eye turned red with severe pain on the right side of her head and behind her eye. She took advil  for the painl. No vision changes, but she continues to experience mild pain in the eye.   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 Marinell office.  Reportedly recommended surgery, but could not afford.   S/P C-section 10/18/2014   UTI (lower urinary tract infection)     Past Surgical History:  Procedure Laterality Date   CESAREAN SECTION N/A 11/09/1998   [place]   CESAREAN SECTION N/A 07/24/2001   [facility].  Premature delivery  due to placenta previa   CESAREAN SECTION WITH BILATERAL TUBAL LIGATION Bilateral 10/18/2014   Procedure: REPEAT CESAREAN SECTION WITH BILATERAL TUBAL LIGATION;  Surgeon: Lynwood KANDICE Solomons, MD;  Location: [facility];  Service: Obstetrics;  Laterality: Bilateral;    Family History  Problem Relation Age of Onset   Diabetes  Mother    Colitis Mother    Diabetes Sister    Diabetes Brother    Colon cancer Paternal Grandmother     Social History Reviewed with no changes to be made today.   Outpatient Medications Prior to Visit  Medication Sig Dispense Refill   ADVIL  200 MG CAPS Take 2 capsules (400 mg total) by mouth every 6 (six) hours as needed (for pain). 120 capsule 0   atorvastatin  (LIPITOR) 20 MG tablet Take 1 tablet (20 mg total) by mouth daily. 90 tablet 1   Blood Glucose Monitoring Suppl (TRUE METRIX METER) w/Device KIT Use as instructed. Check blood glucose level by fingerstick twice per day.  E11.65 1 kit 0   glipiZIDE  (GLUCOTROL ) 5 MG tablet Take 1 tablet (5 mg total) by mouth 2 (two) times daily before a meal. 60 tablet 3   Insulin  Pen Needle (PEN NEEDLES) 32G X 4 MM MISC Use to inject Basaglar  once daily. 100 each 2   lisinopril  (ZESTRIL ) 10 MG tablet Take 10 mg by mouth daily.     metFORMIN  (GLUCOPHAGE -XR) 500 MG 24 hr tablet Take 2 tablets (1,000 mg total) by mouth 2 (two) times daily with a meal. 360 tablet 1   TRUEplus Lancets 28G MISC Use as instructed. Check blood glucose level by fingerstick twice per day.  E11.65 100 each 3   Insulin  Glargine (BASAGLAR  KWIKPEN) 100 UNIT/ML Inject 10 Units into the skin daily. 3 mL 2   diclofenac  Sodium (VOLTAREN ) 1 % GEL Apply 4 g topically 4 (four) times daily. To affected joint. (Patient not taking: Reported on 08/13/2023) 100 g 11   glucose blood (TRUE METRIX BLOOD GLUCOSE TEST) test strip Use as instructed. Check blood glucose level by fingerstick twice per day.  E11.65 100 each 12   loratadine  (CLARITIN ) 10 MG tablet Take 1 tablet (10 mg total) by mouth daily. (Patient not taking: Reported on 08/13/2023) 90 tablet 1   meloxicam  (MOBIC ) 15 MG tablet Take 1 tablet (15 mg total) by mouth daily with a meal for 2 weeks, then once every 24 hours prn pain. (Patient not taking: Reported on 08/13/2023) 30 tablet 3   traMADol  (ULTRAM ) 50 MG tablet Take 1 tablet (50 mg  total) by mouth every 8 (eight) hours as needed for moderate pain (pain score 4-6). (Patient not taking: Reported on 08/13/2023) 21 tablet 0   Semaglutide , 1 MG/DOSE, 4 MG/3ML SOPN Inject 1 mg as directed once a week. (Patient not taking: Reported on 08/13/2023) 3 mL 2   Facility-Administered Medications Prior to Visit  Medication Dose Route Frequency Provider Last Rate Last Admin   acetaminophen  (TYLENOL ) tablet 650 mg  650 mg Oral Once         No Known Allergies     Objective:    BP 102/68 (BP Location: Right Arm, Patient Position: Sitting, Cuff Size: Normal) Comment (BP Location): Lower right arm  Pulse 92   Resp 19   Ht 5' (1.524 m)   Wt 277 lb 12.8 oz (126 kg)   LMP 05/02/2020   SpO2 97%   BMI 54.25 kg/m  Wt Readings from Last 3 Encounters:  08/13/23 277 lb 12.8 oz (  126 kg)  07/29/23 275 lb 4 oz (124.9 kg)  06/04/23 282 lb (127.9 kg)    Physical Exam       Patient has been counseled extensively about nutrition and exercise as well as the importance of adherence with medications and regular follow-up. The patient was given clear instructions to go to ER or return to medical center if symptoms don't improve, worsen or new problems develop. The patient verbalized understanding.   Follow-up: No follow-ups on file.   Haze LELON Servant, FNP-BC [facility] [place], [place] [phone]   08/17/2023, 9:07 PM

## 2023-08-17 ENCOUNTER — Encounter: Payer: Self-pay | Admitting: Nurse Practitioner

## 2023-08-20 ENCOUNTER — Other Ambulatory Visit: Payer: Self-pay | Admitting: Nurse Practitioner

## 2023-08-26 ENCOUNTER — Encounter: Payer: Self-pay | Admitting: Gastroenterology

## 2023-09-03 ENCOUNTER — Encounter (INDEPENDENT_AMBULATORY_CARE_PROVIDER_SITE_OTHER): Payer: Self-pay | Admitting: Otolaryngology

## 2023-09-03 ENCOUNTER — Ambulatory Visit (INDEPENDENT_AMBULATORY_CARE_PROVIDER_SITE_OTHER): Payer: Self-pay | Admitting: Audiology

## 2023-09-03 ENCOUNTER — Ambulatory Visit (INDEPENDENT_AMBULATORY_CARE_PROVIDER_SITE_OTHER): Payer: Self-pay | Admitting: Otolaryngology

## 2023-09-03 ENCOUNTER — Other Ambulatory Visit: Payer: Self-pay

## 2023-09-03 VITALS — BP 122/86 | HR 98 | Ht 60.0 in | Wt 277.0 lb

## 2023-09-03 DIAGNOSIS — J069 Acute upper respiratory infection, unspecified: Secondary | ICD-10-CM

## 2023-09-03 DIAGNOSIS — H6692 Otitis media, unspecified, left ear: Secondary | ICD-10-CM

## 2023-09-03 DIAGNOSIS — Z0110 Encounter for examination of ears and hearing without abnormal findings: Secondary | ICD-10-CM

## 2023-09-03 DIAGNOSIS — H938X2 Other specified disorders of left ear: Secondary | ICD-10-CM

## 2023-09-03 DIAGNOSIS — H66002 Acute suppurative otitis media without spontaneous rupture of ear drum, left ear: Secondary | ICD-10-CM

## 2023-09-03 DIAGNOSIS — H9012 Conductive hearing loss, unilateral, left ear, with unrestricted hearing on the contralateral side: Secondary | ICD-10-CM

## 2023-09-03 MED ORDER — AMOXICILLIN-POT CLAVULANATE 875-125 MG PO TABS
1.0000 | ORAL_TABLET | Freq: Two times a day (BID) | ORAL | 0 refills | Status: AC
  Filled 2023-09-03: qty 10, 5d supply, fill #0

## 2023-09-03 MED ORDER — CIPROFLOXACIN-DEXAMETHASONE 0.3-0.1 % OT SUSP
4.0000 [drp] | Freq: Two times a day (BID) | OTIC | 0 refills | Status: AC
Start: 2023-09-03 — End: 2023-09-10
  Filled 2023-09-03: qty 7.5, 19d supply, fill #0

## 2023-09-03 MED ORDER — FLUTICASONE PROPIONATE 50 MCG/ACT NA SUSP
2.0000 | Freq: Every day | NASAL | 6 refills | Status: DC
  Filled 2023-09-03: qty 16, 30d supply, fill #0
  Filled 2023-10-13: qty 16, 30d supply, fill #1

## 2023-09-03 NOTE — Patient Instructions (Addendum)
 Use flonase  two sprays each nostril daily for 6 weeks Use augmentin  twice daily for 5 days If you have drainage from ear, use ciprodex  drops 4 drops twice daily for 7 days.

## 2023-09-03 NOTE — Progress Notes (Signed)
 Dear Dr. [name], Here is my assessment for our mutual patient, Latika Scott. Thank you for allowing me the opportunity to care for your patient. Please do not hesitate to contact me should you have any other questions. Sincerely, Dr. Eldora Blanch  Otolaryngology Clinic Note Referring provider: Dr. [name] HPI:  Kayla Scott is a 48 y.o. female kindly referred by Dr. [name] for evaluation of left otitis media and concern for mastoiditis.   Initial visit (05/2023): Noted left ear pain and URI sx in late April as below which did not improve and then admitted to hospital for left OM, possible OE. She was discharged on 05/11/2023 after IV antibiotic administration on ciprodex  drops, and augmentin  and levofloxacin .  She presents for follow up today. She reports that she has been slowly improving. Keep left ear dry. Some left ear discomfort and temporal discomfort persists but denies fevers. No trismus. Hearing continues to be muffled in the left ear. She has not noted any drainage. No light sensitivity, fevers. Patient denies: vertigo, drainage Patient additionally denies: eustachian tube symptoms such as popping, crackling, sensitive to pressure changes Patient also denies barotrauma, vestibular suppressant use, ototoxic medication use Prior ear surgery: denies She reports she does not typically have ear infections.  She has been adherent to taking medications and drops and keeping ear dry. Her sugars have been in 300s. --------------------------------------------------------- 05/22/2023 Seen in follow up. Doing better. Ear pain mild but improving; no headache, fevers, drainage; hearing about same.  She has been compliant with her medications. --------------------------------------------------------- 06/03/2023 Seen in follow up. Feels back to normal. Ear pain has resolved. No headache, fevers, drainage. Hearing she thinks is back to baseline. Has finished abx, doing  drops. --------------------------------------------------------- 09/03/2023 Seen in follow up. Did have audiogram today. She reports that she currently has a URI and does have some left ear fullness and slight discomfort with malaise and fevers. No frank pain. No headache. No ear drainage. Prior to this, ear was doing quite well.  In person interpreter used  H&N Surgery: denies Personal or FHx of bleeding dz or anesthesia difficulty: no   GLP-1: no AP/AC: no  Tobacco: no  PMHx: T2DM, HTN, HLD  Independent Review of Additional Tests or Records:  [facility] Admission and ED Visit (05/11/2023 - Dr. Twana) - left ear pain 4-5 days, given unasyn  but got worse, broadened to staph and on vanc/zosyn  with improval; eventually discharged on augmentin  and levoflox with ciprodex  drops. Dr. Roark ENT note 05/09/2023: left ear infection and drainage; dx left OE; Rx: abx Dr. Luiz (05/10/2023) ID: Dx: AOM; Rx: ciprodex  drops, augm and levo CBC 05/11/2023: WBC 6.2, 4/24: 11.2; BMP BUN/Cr 5/0.61 CT Temporal bones independently interpreted with respect to ears 05/08/2023: left ME opacification and mastoid partial opacification, no erosion of OC noted, scutum sharp, no tegmen dehiscence noted; on right mastoids and ME well aerated; No significant canal edema noted. No mastoid coalescence. Diffuse paranasal sinus opacification. 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 Marinell office.  Reportedly recommended surgery,  but could not afford.    S/P C-section 10/18/2014   UTI (lower urinary tract infection)      Past Surgical History:  Procedure Laterality Date   CESAREAN SECTION N/A 11/09/1998   [place]   CESAREAN SECTION N/A 07/24/2001   [facility]  Premature delivery due to placenta previa   CESAREAN SECTION WITH BILATERAL TUBAL LIGATION Bilateral 10/18/2014   Procedure: REPEAT CESAREAN SECTION WITH BILATERAL TUBAL LIGATION;  Surgeon: Lynwood KANDICE Solomons, MD;  Location: [facility];  Service: Obstetrics;  Laterality: Bilateral;    Family History  Problem Relation Age of Onset   Diabetes Mother    Colitis Mother    Diabetes Sister    Diabetes Brother    Colon cancer Paternal Grandmother      Social Connections: Socially Integrated (05/09/2023)   Social Connection and Isolation Panel    Frequency of Communication with Friends and Family: Twice a week    Frequency of Social Gatherings with Friends and Family: More than three times a week    Attends Religious Services: 1 to 4 times per year    Active Member of [employer] or Organizations: Yes    Attends Banker Meetings: 1 to 4 times per year    Marital Status: Married      Current Outpatient Medications:    ADVIL  200 MG CAPS, Take 2 capsules (400 mg total) by mouth every 6 (six) hours as needed (for pain)., Disp: 120 capsule, Rfl: 0   amoxicillin -clavulanate (AUGMENTIN ) 875-125 MG tablet, Take 1 tablet by mouth 2 (two) times daily for 5 days., Disp: 10 tablet, Rfl: 0   atorvastatin  (LIPITOR) 20 MG tablet, Take 1 tablet (20 mg total) by mouth daily., Disp: 90 tablet, Rfl: 1   Blood Glucose Monitoring Suppl (TRUE METRIX METER) w/Device KIT, Use as instructed. Check blood glucose level by fingerstick twice per day.  E11.65, Disp: 1 kit, Rfl: 0   ciprofloxacin -dexamethasone  (CIPRODEX ) OTIC suspension, Place 4 drops into the left ear 2 (two) times daily for 7 days. If develop drainage, Disp: 7.5 mL, Rfl: 0   fluticasone  (FLONASE ) 50 MCG/ACT nasal spray, Place 2 sprays into both  nostrils daily., Disp: 16 g, Rfl: 6   glipiZIDE  (GLUCOTROL ) 5 MG tablet, Take 1 tablet (5 mg total) by mouth 2 (two) times daily before a meal., Disp: 60 tablet, Rfl: 3   glucose blood (TRUE METRIX BLOOD GLUCOSE TEST) test strip, Use as instructed. Check blood glucose level by fingerstick twice per day.  E11.65, Disp: 100 each, Rfl: 12   Insulin  Glargine (BASAGLAR  KWIKPEN) 100 UNIT/ML, Inject 20 Units into the skin daily., Disp: 15 mL, Rfl: 3   Insulin  Pen Needle (PEN NEEDLES) 32G X 4 MM MISC, Use to inject Basaglar  once daily., Disp: 100 each, Rfl: 2   lisinopril  (ZESTRIL ) 10 MG tablet, Take 10 mg by mouth daily., Disp: , Rfl:    metFORMIN  (GLUCOPHAGE -XR) 500 MG 24 hr tablet, Take 2 tablets (1,000 mg total) by mouth 2 (two) times daily with a meal., Disp: 360 tablet, Rfl: 1   Semaglutide , 1 MG/DOSE, 4 MG/3ML SOPN, Inject 1 mg as directed once a week. FOR WEIGHT LOSS AND DIABETES, Disp: 3 mL, Rfl: 2   TRUEplus Lancets 28G MISC, Use as instructed. Check blood glucose level by fingerstick twice per day.  E11.65, Disp: 100 each, Rfl: 3   diclofenac  Sodium (VOLTAREN ) 1 % GEL, Apply 4 g topically 4 (four) times daily. To affected joint. (Patient not taking: Reported on 09/03/2023), Disp: 100  g, Rfl: 11   loratadine  (CLARITIN ) 10 MG tablet, Take 1 tablet (10 mg total) by mouth daily. (Patient not taking: Reported on 09/03/2023), Disp: 90 tablet, Rfl: 1   meloxicam  (MOBIC ) 15 MG tablet, Take 1 tablet (15 mg total) by mouth daily with a meal for 2 weeks, then once every 24 hours prn pain. (Patient not taking: Reported on 09/03/2023), Disp: 30 tablet, Rfl: 3   traMADol  (ULTRAM ) 50 MG tablet, Take 1 tablet (50 mg total) by mouth every 8 (eight) hours as needed for moderate pain (pain score 4-6). (Patient not taking: Reported on 09/03/2023), Disp: 21 tablet, Rfl: 0  Current Facility-Administered Medications:    acetaminophen  (TYLENOL ) tablet 650 mg, 650 mg, Oral, Once,    Physical Exam:   BP 122/86 (BP Location:  Left Wrist, Patient Position: Sitting, Cuff Size: Normal)   Pulse 98   Ht 5' (1.524 m)   Wt 277 lb (125.6 kg)   LMP 05/02/2020   SpO2 97%   BMI 54.10 kg/m   Salient findings:  CN II-XII intact Given history and complaints, ear microscopy was indicated and performed for evaluation with findings as below in physical exam section and in procedures; Bilateral EAC clear and without significant edema; on right: TM intact with well pneumatized middle ear spaces; on left: mild global retraction, unable to appreciate an effusion today; anterior myringosclerosis and TM now intact No postauricular tenderness, proptosis or fluctuance Weber 512: mid Rinne 512: AC > BC b/l  No respiratory distress or stridor  Seprately Identifiable Procedures:  Prior to initiating any procedures, risks/benefits/alternatives were explained to the patient and verbal consent obtained. Procedure: Bilateral ear microscopy using microscope (CPT [phone]) Pre-procedure diagnosis: left otitis media Post-procedure diagnosis: same Indication: see above; given patient's otologic complaints and history, for improved and comprehensive examination of external ear and tympanic membrane, bilateral otologic examination using microscope was performed  Procedure: Patient was placed semi-recumbent. Both ear canals were examined using the microscope with findings above. Patient tolerated the procedure well.    Impression & Plans:  Kayla Scott is a 48 y.o. female with h/o DM now with:  1. Sensation of fullness in left ear   2. Left acute suppurative otitis media   3. Hearing within normal limits in both ears   4. Viral URI    Likely ETD during viral infection in setting of DM; we discussed that no current infection but given recent severity, she would like to get antibiotic prescription just in case with drops if worsening.  Will given rescue Rx augmentin  BID x5d and ciprodex ; should continue flonase  BID and pop  ears Continue supportive care for likely viral URI She is self pay so would prefer to call us  if has issues  See below regarding exact medications prescribed this encounter including dosages and route:  Meds ordered this encounter  Medications   amoxicillin -clavulanate (AUGMENTIN ) 875-125 MG tablet    Sig: Take 1 tablet by mouth 2 (two) times daily for 5 days.    Dispense:  10 tablet    Refill:  0   fluticasone  (FLONASE ) 50 MCG/ACT nasal spray    Sig: Place 2 sprays into both nostrils daily.    Dispense:  16 g    Refill:  6   ciprofloxacin -dexamethasone  (CIPRODEX ) OTIC suspension    Sig: Place 4 drops into the left ear 2 (two) times daily for 7 days. If develop drainage    Dispense:  7.5 mL    Refill:  0  Thank you for allowing me the opportunity to care for your patient. Please do not hesitate to contact me should you have any other questions.  Sincerely, Eldora Blanch, MD Otolaryngologist (ENT), [facility] Phone: [phone] Fax: [phone]  09/03/2023, 1:29 PM   MDM:  Level 4 - 99214 Complexity/Problems addressed: mod Data complexity: low - Morbidity: mod - Prescription Drug prescribed or managed: no

## 2023-09-03 NOTE — Progress Notes (Signed)
  [address], [address] [place], [place] [address] [phone]  Audiological Evaluation    Name: Kayla Scott     DOB:   1975-09-20      MRN:   [id number]                                                                                     Service Date: 09/03/2023     Accompanied by: daughter   Patient comes today after Dr. Tobie, ENT sent a referral for a hearing evaluation due to concerns with ear pain.   Symptoms Yes Details  Hearing loss  []    Tinnitus  [x]  Sound wave sound sometimes, more in the left ear.  Ear pain/ infections/pressure  [x]  Reports that today has pain in both ears.  Balance problems  []    Noise exposure history  []    Previous ear surgeries  []    Family history of hearing loss  []    Amplification  []    Other  []      Otoscopy: Right ear: Clear external ear canal and notable landmarks visualized on the tympanic membrane. Left ear:  Clear external ear canal and notable landmarks visualized on the tympanic membrane.  Tympanometry: Right ear: Type A- Normal external ear canal volume with normal middle ear pressure and tympanic membrane compliance. Left ear: Normal external ear canal volume with negative middle ear pressure and reduced tympanic membrane compliance.   Pure tone Audiometry: Right ear- Normal hearing from 250 Hz - 8000 Hz. Left ear-  Normal hearing from 250 Hz - 8000 Hz, with a 15dBHL conductive component (air-bone gap) at 500 Hz.  Speech Audiometry: Right ear- Speech Reception Threshold (SRT) was obtained at 15 dBHL. Left ear-Speech Reception Threshold (SRT) was obtained at 15 dBHL.   Word Recognition Score Tested using Spanish list (recorded) Right ear: 100% was obtained at a presentation level of 50 dBHL with contralateral masking which is deemed as  excellent. Left ear: 100% was obtained at a presentation level of 50 dBHL with contralateral masking which is deemed as  excellent.   The hearing test results were completed  under headphones and results are deemed to be of good reliability. Test technique:  conventional     Recommendations: Follow up with ENT as scheduled for today. Return for a hearing evaluation if concerns with hearing changes arise or per MD recommendation.   Leiby Pigeon MARIE LEROUX-MARTINEZ, AUD

## 2023-09-05 ENCOUNTER — Other Ambulatory Visit: Payer: Self-pay

## 2023-09-05 ENCOUNTER — Other Ambulatory Visit: Payer: Self-pay | Admitting: Physician Assistant

## 2023-09-05 ENCOUNTER — Other Ambulatory Visit: Payer: Self-pay | Admitting: Family Medicine

## 2023-09-05 DIAGNOSIS — H6592 Unspecified nonsuppurative otitis media, left ear: Secondary | ICD-10-CM

## 2023-09-08 ENCOUNTER — Other Ambulatory Visit: Payer: Self-pay

## 2023-09-16 ENCOUNTER — Encounter: Payer: Self-pay | Admitting: Sports Medicine

## 2023-09-23 ENCOUNTER — Ambulatory Visit: Payer: Self-pay | Admitting: Physician Assistant

## 2023-10-07 ENCOUNTER — Other Ambulatory Visit: Payer: Self-pay | Admitting: Nurse Practitioner

## 2023-10-07 DIAGNOSIS — E1165 Type 2 diabetes mellitus with hyperglycemia: Secondary | ICD-10-CM

## 2023-10-07 NOTE — Telephone Encounter (Unsigned)
 Copied from CRM #[id number]. Topic: Clinical - Medication Refill >> Oct 07, 2023 11:50 AM Myrick T wrote: Medication: Insulin  Glargine (BASAGLAR  KWIKPEN) 100 UNIT/ML, fluticasone  (FLONASE ) 50 MCG/ACT nasal spray, Semaglutide , 1 MG/DOSE, 4 MG/3ML SOPN and Insulin  Pen Needle (PEN NEEDLES) 32G X 4 MM MISC  Has the patient contacted their pharmacy? Yes  This is the patient's preferred pharmacy:  [facility] [address], [address] [place] [address] Phone: [phone] Fax: [phone]   Is this the correct pharmacy for this prescription? Yes  Has the prescription been filled recently? Yes  Is the patient out of the medication? No  Has the patient been seen for an appointment in the last year OR does the patient have an upcoming appointment? Yes  Can we respond through MyChart? Yes  Agent: Please be advised that Rx refills may take up to 3 business days. We ask that you follow-up with your pharmacy.

## 2023-10-13 ENCOUNTER — Other Ambulatory Visit: Payer: Self-pay

## 2023-10-23 ENCOUNTER — Other Ambulatory Visit: Payer: Self-pay | Admitting: Nurse Practitioner

## 2023-10-23 DIAGNOSIS — E7800 Pure hypercholesterolemia, unspecified: Secondary | ICD-10-CM

## 2023-10-23 DIAGNOSIS — E119 Type 2 diabetes mellitus without complications: Secondary | ICD-10-CM

## 2023-11-17 ENCOUNTER — Other Ambulatory Visit: Payer: Self-pay | Admitting: Nurse Practitioner

## 2023-11-20 ENCOUNTER — Encounter: Payer: Self-pay | Admitting: Physician Assistant

## 2023-11-20 ENCOUNTER — Other Ambulatory Visit: Payer: Self-pay

## 2023-11-20 ENCOUNTER — Ambulatory Visit: Payer: Self-pay | Admitting: Physician Assistant

## 2023-11-20 ENCOUNTER — Ambulatory Visit (INDEPENDENT_AMBULATORY_CARE_PROVIDER_SITE_OTHER): Payer: Self-pay | Admitting: Physician Assistant

## 2023-11-20 VITALS — BP 122/66 | HR 118 | Ht 60.5 in | Wt 274.4 lb

## 2023-11-20 DIAGNOSIS — Z800 Family history of malignant neoplasm of digestive organs: Secondary | ICD-10-CM

## 2023-11-20 DIAGNOSIS — Z794 Long term (current) use of insulin: Secondary | ICD-10-CM

## 2023-11-20 DIAGNOSIS — Z7984 Long term (current) use of oral hypoglycemic drugs: Secondary | ICD-10-CM

## 2023-11-20 DIAGNOSIS — K625 Hemorrhage of anus and rectum: Secondary | ICD-10-CM

## 2023-11-20 DIAGNOSIS — E119 Type 2 diabetes mellitus without complications: Secondary | ICD-10-CM

## 2023-11-20 DIAGNOSIS — R197 Diarrhea, unspecified: Secondary | ICD-10-CM

## 2023-11-20 DIAGNOSIS — Z7985 Long-term (current) use of injectable non-insulin antidiabetic drugs: Secondary | ICD-10-CM

## 2023-11-20 DIAGNOSIS — Z1211 Encounter for screening for malignant neoplasm of colon: Secondary | ICD-10-CM

## 2023-11-20 LAB — BASIC METABOLIC PANEL WITH GFR
BUN: 15 mg/dL (ref 6–23)
CO2: 29 meq/L (ref 19–32)
Calcium: 9.4 mg/dL (ref 8.4–10.5)
Chloride: 101 meq/L (ref 96–112)
Creatinine, Ser: 0.67 mg/dL (ref 0.40–1.20)
GFR: 103.8 mL/min (ref >60.00)
Glucose, Bld: 144 mg/dL — ABNORMAL HIGH (ref 70–99)
Potassium: 3.8 meq/L (ref 3.5–5.1)
Sodium: 139 meq/L (ref 135–145)

## 2023-11-20 LAB — HEMOGLOBIN A1C: Hgb A1c MFr Bld: 7.7 % — ABNORMAL HIGH (ref 4.6–6.5)

## 2023-11-20 MED ORDER — NA SULFATE-K SULFATE-MG SULF 17.5-3.13-1.6 GM/177ML PO SOLN
1.0000 | Freq: Once | ORAL | 0 refills | Status: AC
  Filled 2023-11-20: qty 354, 1d supply, fill #0

## 2023-11-20 NOTE — Patient Instructions (Addendum)
 Your provider has requested that you go to the basement level for lab work before leaving today. Press B on the elevator. The lab is located at the first door on the left as you exit the elevator.  You have been scheduled for a Colonoscopy. Please follow written instructions given to you at your visit today.   If you use inhalers (even only as needed), please bring them with you on the day of your procedure.  DO NOT TAKE 7 DAYS PRIOR TO TEST- Trulicity (dulaglutide) Ozempic , Wegovy  (semaglutide ) Mounjaro (tirzepatide) Bydureon Bcise (exanatide extended release)  DO NOT TAKE 1 DAY PRIOR TO YOUR TEST Rybelsus  (semaglutide ) Adlyxin (lixisenatide) Victoza (liraglutide) Byetta (exanatide) ___________________________________________________________________________  Please follow up sooner if symptoms increase or worsen   Due to recent changes in healthcare laws, you may see the results of your imaging and laboratory studies on MyChart before your provider has had a chance to review them.  We understand that in some cases there may be results that are confusing or concerning to you. Not all laboratory results come back in the same time frame and the provider may be waiting for multiple results in order to interpret others.  Please give us  48 hours in order for your provider to thoroughly review all the results before contacting the office for clarification of your results.   Thank you for trusting me with your gastrointestinal care!   Ellouise Console, PA-C _______________________________________________________  If your blood pressure at your visit was 140/90 or greater, please contact your primary care physician to follow up on this.  _______________________________________________________  If you are age 5 or older, your body mass index should be between 23-30. Your Body mass index is 52.71 kg/m. If this is out of the aforementioned range listed, please consider follow up with your  Primary Care Provider.  If you are age 35 or younger, your body mass index should be between 19-25. Your Body mass index is 52.71 kg/m. If this is out of the aformentioned range listed, please consider follow up with your Primary Care Provider.   ________________________________________________________  The  GI providers would like to encourage you to use MYCHART to communicate with providers for non-urgent requests or questions.  Due to long hold times on the telephone, sending your provider a message by [facility] may be a faster and more efficient way to get a response.  Please allow 48 business hours for a response.  Please remember that this is for non-urgent requests.  _______________________________________________________

## 2023-11-20 NOTE — Progress Notes (Signed)
 Patient needs Spanish interpreter.  Call and notify patient blood sugars have greatly improved from 3 months ago.  Her hemoglobin A1c has improved from 11.8 down to 7.7 on diabetes medications.  Please continue current medications and follow-up with PCP as scheduled.  Continue with plan for colonoscopy as scheduled in hospital. Ellouise Console, PA-C

## 2023-11-20 NOTE — Progress Notes (Signed)
 Kayla Console, PA-C [address] [place], [place]  [address] Phone: [phone]   Primary Care Physician: [name] Haze ORN, NP  Primary Gastroenterologist:  Kayla Console, PA-C / Glendia Holt, MD   Chief Complaint: Follow-up constipation and rectal bleeding       HPI:   Discussed the use of AI scribe software for clinical note transcription with the patient, who gave verbal consent to proceed.  We are using language interpreter from [facility] language resources today.  48 year old female, with history of morbid obesity (BMI 53) and uncontrolled diabetes (hemoglobin A1c 11.1) returns for 18-month follow-up of rectal bleeding and constipation.  She has Family history of her grandmother who had colon cancer.  Patient has never had a colonoscopy.  She had normal rectal exam 4 months ago.  She was advised to take MiraLAX daily for constipation.  Labs showed extremely elevated glucose 495.  She followed up with PCP and A1c was 11.8.  She was advised to get blood sugars under better control before we could schedule colonoscopy which would need to be done in hospital due to BMI greater than 50.  07/29/2023 labs: Normal hemoglobin 14.3, normal iron panel, ferritin, B12, and folate.  Glucose 495.  She followed up with PCP for diabetes and hemoglobin A1c was 11.8.  Colonoscopy postponed until diabetes was under better control.  Currently she is on metformin , semaglutide , insulin , and glipizide  for diabetes management.  Followed by Haze [name], NP.  Blood sugars.  She denies any current GI symptoms such as abdominal pain, diarrhea, constipation, or rectal bleeding.  She has not had any more rectal bleeding episodes since her office visit 4 months ago.  Bowel movements have become more regular.  She no longer has constipation.  She is not taking MiraLAX.  She is on metformin .  Denies diarrhea.  She is feeling better from 3 months ago.   06/2022 CT abdomen pelvis with contrast: 1. No acute CT  findings of the abdomen or pelvis to explain left lower quadrant pain. 2. Hepatic steatosis. 3. No fracture or dislocation of the lumbar spine. Minimal lumbar endplate osteophytosis without significant disc space height loss.  Current Outpatient Medications  Medication Sig Dispense Refill   ADVIL  200 MG CAPS Take 2 capsules (400 mg total) by mouth every 6 (six) hours as needed (for pain). 120 capsule 0   Blood Glucose Monitoring Suppl (TRUE METRIX METER) w/Device KIT Use as instructed. Check blood glucose level by fingerstick twice per day.  E11.65 1 kit 0   glipiZIDE  (GLUCOTROL ) 5 MG tablet Take 1 tablet (5 mg total) by mouth 2 (two) times daily before a meal. 60 tablet 3   glucose blood (TRUE METRIX BLOOD GLUCOSE TEST) test strip Use as instructed. Check blood glucose level by fingerstick twice per day.  E11.65 100 each 12   Insulin  Glargine (BASAGLAR  KWIKPEN) 100 UNIT/ML Inject 20 Units into the skin daily. 15 mL 3   Insulin  Pen Needle (PEN NEEDLES) 32G X 4 MM MISC Use to inject Basaglar  once daily. 100 each 2   lisinopril  (ZESTRIL ) 10 MG tablet Take 1 tablet (10 mg total) by mouth daily. Must have office visit for refills 30 tablet 0   metFORMIN  (GLUCOPHAGE -XR) 500 MG 24 hr tablet TAKE 2 TABLETS BY MOUTH TWICE DAILY WITH MEALS 360 tablet 0   psyllium (HYDROCIL/METAMUCIL) 95 % PACK Take 1 packet by mouth daily as needed for mild constipation.     Semaglutide , 1 MG/DOSE, 4 MG/3ML  SOPN Inject 1 mg as directed once a week. FOR WEIGHT LOSS AND DIABETES 3 mL 2   TRUEplus Lancets 28G MISC Use as instructed. Check blood glucose level by fingerstick twice per day.  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 Marinell office.  Reportedly recommended surgery, but could not afford.   S/P C-section 10/18/2014   UTI (lower urinary tract infection)     Past Surgical History:  Procedure Laterality Date   CESAREAN SECTION N/A 11/09/1998   [place]   CESAREAN SECTION N/A 07/24/2001   [facility].  Premature delivery due to placenta previa   CESAREAN SECTION WITH BILATERAL TUBAL LIGATION Bilateral 10/18/2014   Procedure: REPEAT CESAREAN SECTION WITH BILATERAL TUBAL LIGATION;  Surgeon: Lynwood KANDICE Solomons, MD;  Location: [facility];  Service: Obstetrics;  Laterality: Bilateral;    Review of Systems:    All systems reviewed and negative except where noted in HPI.    Physical Exam:  BP 122/66   Pulse (!) 118   Ht 5' 0.5 (1.537 m)   Wt 274 lb 6.4 oz (124.5 kg)   LMP 05/02/2020   SpO2 97%   BMI 52.71 kg/m  Patient's last menstrual period was 05/02/2020.  General: Well-nourished, obese in no acute distress.  Lungs: Clear to auscultation bilaterally. Non-labored. Heart: Regular rate and rhythm, no murmurs rubs or gallops.  Abdomen: Bowel sounds are normal; Abdomen is Soft; No hepatosplenomegaly, masses or hernias;  No Abdominal Tenderness; No guarding or rebound tenderness. Neuro: Alert and oriented x 3.  Grossly intact.  Psych: Alert and cooperative, normal mood and affect.   Imaging Studies: No results found.  Labs: CBC    Component Value Date/Time   WBC 6.9 07/29/2023 1507   RBC 4.48 07/29/2023 1507   HGB 14.3 07/29/2023 1507   HGB 13.7 06/06/2022 0821   HGB 12.7 05/17/2014 0000   HCT 42.8 07/29/2023 1507   HCT 40.8 06/06/2022 0821   HCT 40 05/17/2014 0000   PLT 221.0 07/29/2023 1507   PLT 279  06/06/2022 0821   PLT 277 05/17/2014 0000   MCV 95.4 07/29/2023 1507   MCV 99 (H) 06/06/2022 0821   MCH 32.4 05/11/2023 0739   MCHC 33.5 07/29/2023 1507   RDW 14.6 07/29/2023 1507   RDW 12.3 06/06/2022 0821   LYMPHSABS 1.1 07/29/2023 1507   LYMPHSABS 3.2 (H) 06/06/2022 0821   MONOABS 0.4 07/29/2023 1507   EOSABS 0.0 07/29/2023 1507   EOSABS 0.1 06/06/2022 0821   BASOSABS 0.0 07/29/2023 1507   BASOSABS 0.1 06/06/2022 0821    CMP     Component Value Date/Time   NA 133 (L) 07/29/2023 1507   NA 142 12/06/2022 1535   K 3.8 07/29/2023 1507   CL 99 07/29/2023 1507   CO2 26 07/29/2023 1507   GLUCOSE 495 (H) 07/29/2023 1507  BUN 22 07/29/2023 1507   BUN 20 12/06/2022 1535   CREATININE 0.76 07/29/2023 1507   CREATININE 0.54 05/23/2014 1315   CALCIUM  10.0 07/29/2023 1507   PROT 6.0 (L) 05/10/2023 0538   PROT 7.1 12/06/2022 1535   ALBUMIN 2.2 (L) 05/10/2023 0538   ALBUMIN 4.0 12/06/2022 1535   AST 15 05/10/2023 0538   ALT 21 05/10/2023 0538   ALKPHOS 57 05/10/2023 0538   BILITOT 1.2 05/10/2023 0538   BILITOT 0.3 12/06/2022 1535   GFRNONAA >60 05/11/2023 0739   GFRAA 129 12/17/2017 0843       Assessment and Plan:   Kayla Scott is a 48 y.o. y/o female returns for follow-up of:  1.  Rectal bleeding: Resolved - Schedule colonoscopy in hospital Scheduling Colonoscopy I discussed risks of colonoscopy with patient to include risk of bleeding, colon perforation, and risk of sedation.  Patient expressed understanding and agrees to proceed with colonoscopy.   2.  Constipation: Improved / Resolved - Suspect constipation improved since she started metformin . -She no longer needs or takes MiraLAX.  3.  Uncontrolled diabetes (A1c 11.1 in July 2025).  Since then she has been started on metformin , glipizide , insulin , and semaglutide .  Her blood sugars have greatly improved since starting these medications.  Glucose between 95 and 120. - Recheck hemoglobin A1c and  BMP.  4.  Morbid obesity (BMI 52) - Schedule colonoscopy in hospital   Kayla Console, PA-C  Follow up as needed based on colonoscopy results and GI symptoms.

## 2023-11-26 ENCOUNTER — Other Ambulatory Visit: Payer: Self-pay

## 2023-11-27 ENCOUNTER — Other Ambulatory Visit: Payer: Self-pay

## 2023-12-02 NOTE — Progress Notes (Signed)
 Agree with the assessment and plan as outlined by Brigitte Canard, PA-C.

## 2023-12-04 ENCOUNTER — Ambulatory Visit: Payer: Self-pay | Admitting: Nurse Practitioner

## 2023-12-04 ENCOUNTER — Other Ambulatory Visit: Payer: Self-pay | Admitting: Nurse Practitioner

## 2023-12-04 DIAGNOSIS — E1165 Type 2 diabetes mellitus with hyperglycemia: Secondary | ICD-10-CM

## 2023-12-04 NOTE — Telephone Encounter (Signed)
 Copied from CRM [id number]. Topic: Clinical - Medication Refill >> Dec 04, 2023  3:07 PM Rosaria E wrote: Medication: Semaglutide , 1 MG/DOSE, 4 MG/3ML SOPN  fluconazole  (DIFLUCAN ) 150 MG tablet  Has the patient contacted their pharmacy? Yes (Agent: If no, request that the patient contact the pharmacy for the refill. If patient does not wish to contact the pharmacy document the reason why and proceed with request.) (Agent: If yes, when and what did the pharmacy advise?)  This is the patient's preferred pharmacy:  [facility] [address] [place] [address] Phone: [phone] Fax: [phone]  Is this the correct pharmacy for this prescription? Yes If no, delete pharmacy and type the correct one.   Has the prescription been filled recently? Yes  Is the patient out of the medication? Yes  Has the patient been seen for an appointment in the last year OR does the patient have an upcoming appointment? Yes  Can we respond through MyChart? Yes  Agent: Please be advised that Rx refills may take up to 3 business days. We ask that you follow-up with your pharmacy.

## 2023-12-04 NOTE — Telephone Encounter (Signed)
 FYI Only or Action Required?: Action required by provider: medication refill request and requesting 1 other Rx not on active med list.  Patient was last seen in primary care on 08/13/2023 by Kayla Scott ORN, NP.  Called Nurse Triage reporting Medication Refill.  Symptoms began chronic x 2 years.  Interventions attempted: Prescription medications: fluconazole  (DIFLUCAN ) 150 MG tablet.   Triage Disposition: Call PCP When Office is Open  Patient/caregiver understands and will follow disposition?: Yes          Copied from CRM #[id number]. Topic: Clinical - Medication Refill >> Dec 04, 2023  3:07 PM Rosaria E wrote: Medication: Semaglutide , 1 MG/DOSE, 4 MG/3ML SOPN  fluconazole  (DIFLUCAN ) 150 MG tablet  Has the patient contacted their pharmacy? Yes (Agent: If no, request that the patient contact the pharmacy for the refill. If patient does not wish to contact the pharmacy document the reason why and proceed with request.) (Agent: If yes, when and what did the pharmacy advise?)  This is the patient's preferred pharmacy:  [facility] [address], [address] [place] [address] Phone: [phone] Fax: [phone]  Is this the correct pharmacy for this prescription? Yes If no, delete pharmacy and type the correct one.   Has the prescription been filled recently? Yes  Is the patient out of the medication? Yes  Has the patient been seen for an appointment in the last year OR does the patient have an upcoming appointment? Yes  Can we respond through MyChart? Yes  Agent: Please be advised that Rx refills may take up to 3 business days. We ask that you follow-up with your pharmacy. Reason for Disposition  [1] Prescription refill request for NON-ESSENTIAL medicine (i.e., no harm to patient if med not taken) AND [2] triager unable to refill per department policy    Triager was able to pend 1 requested Rx.  Triager will  forward encounter for [name], NP 's office to review and refill.  Answer Assessment - Initial Assessment Questions 1. DRUG NAME: What medicine do you need to have refilled?     fluconazole  (DIFLUCAN ) 150 MG tablet 2. REFILLS REMAINING: How many refills are remaining? Notes: The label on the medicine or pill bottle will show how many refills are remaining. If there are no refills remaining, then a renewal may be needed.     0  3. EXPIRATION DATE: What is the expiration date? Note: The label states when the prescription will expire, and thus can no longer be refilled.)     0  4. PRESCRIBER: Who prescribed it? Note: The prescribing doctor or group is responsible for refill approvals.SABRA     PCP 5. PHARMACY: Have you contacted your pharmacy (drugstore)? Note: Some pharmacies will contact the doctor (or NP/PA).      Oh file 6. SYMPTOMS: Do you have any symptoms?     Pt endorses chronic vaginal itching secondary to diabetes. Has been taking Rx for the past 2 years to control sx. 7. PREGNANCY: Is there any chance that you are pregnant? When was your last menstrual period?     N/a  Answer Assessment - Initial Assessment Questions 1. SYMPTOM: What's the main symptom you're concerned about? (e.g., pain, itching, dryness)     itchiness 2. LOCATION: Where is the  *No Answer* located? (e.g., inside/outside, left/right)     *No Answer* 3. ONSET: When did the  sx  start?     Chronic for 2 years 4. PAIN: Is  there any pain? If Yes, ask: How bad is it? (Scale: 1-10; mild, moderate, severe)     *No Answer* 5. ITCHING: Is there any itching? If Yes, ask: How bad is it? (Scale: 1-10; mild, moderate, severe)     *No Answer* 6. CAUSE: What do you think is causing the discharge? Have you had the same problem before? What happened then?     *No Answer* 7. OTHER SYMPTOMS: Do you have any other symptoms? (e.g., fever, itching, vaginal bleeding, pain with urination, injury to  genital area, vaginal foreign body)     *No Answer* 8. PREGNANCY: Is there any chance you are pregnant? When was your last menstrual period?     *No Answer*  Protocols used: Medication Refill and Renewal Call-A-AH, Vaginal Symptoms-A-AH

## 2023-12-05 ENCOUNTER — Other Ambulatory Visit: Payer: Self-pay

## 2023-12-05 MED ORDER — SEMAGLUTIDE (1 MG/DOSE) 4 MG/3ML ~~LOC~~ SOPN
1.0000 mg | PEN_INJECTOR | SUBCUTANEOUS | 2 refills | Status: DC
  Filled 2023-12-05: qty 3, 28d supply, fill #0
  Filled 2024-01-14 (×2): qty 3, 28d supply, fill #1
  Filled 2024-02-09: qty 3, 28d supply, fill #2

## 2023-12-22 ENCOUNTER — Encounter (HOSPITAL_COMMUNITY): Payer: Self-pay | Admitting: Gastroenterology

## 2023-12-23 ENCOUNTER — Telehealth: Payer: Self-pay

## 2023-12-23 NOTE — Telephone Encounter (Signed)
 Procedure:COLON Procedure date: 12/29/23 Procedure location: [facility] Arrival Time: 7:46 Spoke with the patient Y/N: Y Any prep concerns? N  Has the patient obtained the prep from the pharmacy ? Y Do you have a care partner and transportation: Y Any additional concerns? N

## 2023-12-29 ENCOUNTER — Ambulatory Visit (HOSPITAL_COMMUNITY): Payer: Self-pay | Admitting: Anesthesiology

## 2023-12-29 ENCOUNTER — Other Ambulatory Visit: Payer: Self-pay

## 2023-12-29 ENCOUNTER — Encounter (HOSPITAL_COMMUNITY): Admission: RE | Disposition: A | Payer: Self-pay | Source: Home / Self Care | Attending: Gastroenterology

## 2023-12-29 ENCOUNTER — Ambulatory Visit (HOSPITAL_COMMUNITY)
Admission: RE | Admit: 2023-12-29 | Discharge: 2023-12-29 | Disposition: A | Payer: Self-pay | Attending: Gastroenterology | Admitting: Gastroenterology

## 2023-12-29 DIAGNOSIS — Z1211 Encounter for screening for malignant neoplasm of colon: Secondary | ICD-10-CM

## 2023-12-29 DIAGNOSIS — K625 Hemorrhage of anus and rectum: Secondary | ICD-10-CM

## 2023-12-29 DIAGNOSIS — E119 Type 2 diabetes mellitus without complications: Secondary | ICD-10-CM | POA: Insufficient documentation

## 2023-12-29 DIAGNOSIS — Z79899 Other long term (current) drug therapy: Secondary | ICD-10-CM | POA: Insufficient documentation

## 2023-12-29 DIAGNOSIS — Z6841 Body Mass Index (BMI) 40.0 and over, adult: Secondary | ICD-10-CM | POA: Insufficient documentation

## 2023-12-29 DIAGNOSIS — I10 Essential (primary) hypertension: Secondary | ICD-10-CM | POA: Insufficient documentation

## 2023-12-29 DIAGNOSIS — M1711 Unilateral primary osteoarthritis, right knee: Secondary | ICD-10-CM | POA: Insufficient documentation

## 2023-12-29 DIAGNOSIS — Z7984 Long term (current) use of oral hypoglycemic drugs: Secondary | ICD-10-CM | POA: Insufficient documentation

## 2023-12-29 DIAGNOSIS — Z800 Family history of malignant neoplasm of digestive organs: Secondary | ICD-10-CM | POA: Insufficient documentation

## 2023-12-29 DIAGNOSIS — Z7985 Long-term (current) use of injectable non-insulin antidiabetic drugs: Secondary | ICD-10-CM | POA: Insufficient documentation

## 2023-12-29 DIAGNOSIS — K5909 Other constipation: Secondary | ICD-10-CM | POA: Insufficient documentation

## 2023-12-29 DIAGNOSIS — E6689 Other obesity not elsewhere classified: Secondary | ICD-10-CM | POA: Insufficient documentation

## 2023-12-29 DIAGNOSIS — Z794 Long term (current) use of insulin: Secondary | ICD-10-CM | POA: Insufficient documentation

## 2023-12-29 HISTORY — PX: COLONOSCOPY: SHX5424

## 2023-12-29 LAB — GLUCOSE, CAPILLARY: Glucose-Capillary: 108 mg/dL — ABNORMAL HIGH (ref 70–99)

## 2023-12-29 SURGERY — COLONOSCOPY
Anesthesia: Monitor Anesthesia Care

## 2023-12-29 MED ORDER — PROPOFOL 10 MG/ML IV BOLUS
INTRAVENOUS | Status: DC | PRN
  Administered 2023-12-29: 09:00:00 30 mg via INTRAVENOUS

## 2023-12-29 MED ORDER — PROPOFOL 500 MG/50ML IV EMUL
INTRAVENOUS | Status: DC | PRN
  Administered 2023-12-29: 09:00:00 115 ug/kg/min via INTRAVENOUS

## 2023-12-29 MED ORDER — SODIUM CHLORIDE 0.9 % IV SOLN: INTRAVENOUS | Status: DC

## 2023-12-29 NOTE — Anesthesia Preprocedure Evaluation (Addendum)
 Anesthesia Evaluation  Patient identified by MRN, date of birth, ID band Patient awake    Reviewed: Allergy & Precautions, NPO status , Patient's Chart, lab work & pertinent test results  Airway Mallampati: II  TM Distance: >3 FB Neck ROM: Full    Dental  (+) Teeth Intact, Dental Advisory Given   Pulmonary neg pulmonary ROS   Pulmonary exam normal breath sounds clear to auscultation       Cardiovascular hypertension, Pt. on medications Normal cardiovascular exam Rhythm:Regular Rate:Normal     Neuro/Psych negative neurological ROS  negative psych ROS   GI/Hepatic Neg liver ROS,,,family history of colon cancer and rectal bleeding   Endo/Other  diabetes, Type 2, Oral Hypoglycemic Agents, Insulin  Dependent  Class 4 obesity  Renal/GU negative Renal ROS     Musculoskeletal  (+) Arthritis ,    Abdominal   Peds  Hematology negative hematology ROS (+)   Anesthesia Other Findings   Reproductive/Obstetrics                              Anesthesia Physical Anesthesia Plan  ASA: 4  Anesthesia Plan: MAC   Post-op Pain Management:    Induction: Intravenous  PONV Risk Score and Plan: 2 and TIVA and Treatment may vary due to age or medical condition  Airway Management Planned: Natural Airway and Simple Face Mask  Additional Equipment:   Intra-op Plan:   Post-operative Plan:   Informed Consent: I have reviewed the patients History and Physical, chart, labs and discussed the procedure including the risks, benefits and alternatives for the proposed anesthesia with the patient or authorized representative who has indicated his/her understanding and acceptance.     Dental advisory given and Interpreter used for interview  Plan Discussed with: CRNA and Anesthesiologist  Anesthesia Plan Comments: (Patient's son acting as interpreter during pre-op evaluation )         Anesthesia Quick  Evaluation

## 2023-12-29 NOTE — Discharge Instructions (Signed)

## 2023-12-29 NOTE — Anesthesia Postprocedure Evaluation (Signed)
 Anesthesia Post Note  Patient: Kayla Scott  Procedure(s) Performed: COLONOSCOPY     Patient location during evaluation: Endoscopy Anesthesia Type: MAC Level of consciousness: oriented, awake and alert and awake Pain management: pain level controlled Vital Signs Assessment: post-procedure vital signs reviewed and stable Respiratory status: spontaneous breathing, nonlabored ventilation, respiratory function stable and patient connected to nasal cannula oxygen Cardiovascular status: blood pressure returned to baseline and stable Postop Assessment: no headache, no backache and no apparent nausea or vomiting Anesthetic complications: no   No notable events documented.  Last Vitals:  Vitals:   12/29/23 0933 12/29/23 0940  BP: (!) 114/59 127/71  Pulse: 85 80  Resp:    Temp:  36.6 C  SpO2:  99%    Last Pain:  Vitals:   12/29/23 0940  TempSrc:   PainSc: 0-No pain                 Garnette FORBES Skillern

## 2023-12-29 NOTE — H&P (Signed)
 [facility] History and Physical   Primary Care Physician:  [name] Haze ORN, NP   Reason for Procedure:   Colon cancer screening  Plan:    Screening colonoscopy   HPI: Kayla Scott is a 48 y.o. female undergoing initial average risk screening colonoscopy.  She has had rectal bleeding in the past, but not since July  She struggles with chronic constipation, otherwise no chronic GI symptoms.  Her grandmother had colon cancer, otherwise no family history of colon cancer.   The patient was provided an opportunity to ask questions and all were answered. 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 Marinell office.  Reportedly recommended surgery, but could not afford.   S/P C-section 10/18/2014   UTI (lower urinary tract infection)     Past Surgical History:  Procedure Laterality Date   CESAREAN SECTION N/A 11/09/1998   [place]   CESAREAN SECTION N/A 07/24/2001   Women[facility].  Premature delivery due to placenta previa   CESAREAN SECTION WITH BILATERAL TUBAL LIGATION Bilateral 10/18/2014   Procedure: REPEAT CESAREAN SECTION WITH BILATERAL TUBAL LIGATION;  Surgeon: Lynwood KANDICE Solomons, MD;  Location: [facility];  Service: Obstetrics;  Laterality: Bilateral;    Prior to Admission medications  Medication Sig Start Date End Date Taking? Authorizing Provider  atorvastatin  (LIPITOR) 20 MG tablet Take 1 tablet by mouth once daily 10/23/23  Yes Newlin, Enobong, MD  glipiZIDE  (GLUCOTROL ) 5 MG tablet Take 1 tablet (5 mg total) by mouth 2 (two) times daily before a meal. 06/12/23  Yes Newlin, Enobong, MD  Insulin  Glargine (BASAGLAR  KWIKPEN) 100 UNIT/ML Inject 20 Units into the skin daily. 08/13/23  Yes Fleming, [name] W, NP  lisinopril  (ZESTRIL )  10 MG tablet Take 1 tablet (10 mg total) by mouth daily. Must have office visit for refills 11/17/23  Yes Newlin, Enobong, MD  metFORMIN  (GLUCOPHAGE -XR) 500 MG 24 hr tablet TAKE 2 TABLETS BY MOUTH TWICE DAILY WITH MEALS 10/23/23  Yes Newlin, Enobong, MD  ADVIL  200 MG CAPS Take 2 capsules (400 mg total) by mouth every 6 (six) hours as needed (for pain). 05/11/23   Shahmehdi, Adriana LABOR, MD  Blood Glucose Monitoring Suppl (TRUE METRIX METER) w/Device KIT Use as instructed. Check blood glucose level by fingerstick twice per day.  E11.65 06/12/23   Newlin, Enobong, MD  glucose blood (TRUE METRIX BLOOD GLUCOSE TEST) test strip Use as instructed. Check blood glucose level by fingerstick twice per day.  E11.65 06/12/23   Delbert Clam, MD  Insulin  Pen Needle (PEN NEEDLES) 32G X 4 MM MISC Use to inject Basaglar  once daily. 06/12/23   Newlin, Enobong, MD  psyllium (HYDROCIL/METAMUCIL) 95 % PACK Take 1 packet by mouth daily as needed for mild constipation.    [provider]  Semaglutide , 1 MG/DOSE, 4 MG/3ML SOPN Inject 1 mg as directed once a week. FOR WEIGHT LOSS AND DIABETES 12/05/23   Fleming, [name] W, NP  TRUEplus Lancets 28G MISC Use as instructed. Check blood glucose level by fingerstick twice per day.  E11.65 06/12/23   Delbert Clam, MD    Current Facility-Administered Medications  Medication Dose Route Frequency Provider Last Rate Last Admin   0.9 %  sodium chloride  infusion   Intravenous Continuous  [place], PA-C        Allergies as of 11/20/2023   (No Known Allergies)    Family History  Problem Relation Age of Onset   Diabetes Mother    Colitis Mother    Diabetes Sister    Diabetes Brother    Colon cancer Paternal Grandmother 76 - 76    Social History   Socioeconomic History   Marital status: Married    Spouse name: Herschel Fredric Hope   Number of children: 5   Years of education: 12   Highest education level: High school graduate  Occupational History    Occupation: Housewife    Comment: Previously cleaned at [employer]  Tobacco Use   Smoking status: Never    Passive exposure: Never   Smokeless tobacco: Never  Vaping Use   Vaping status: Never Used  Substance and Sexual Activity   Alcohol use: No   Drug use: No   Sexual activity: Yes    Birth control/protection: Surgical    Comment: Tubal ligation  Other Topics Concern   Not on file  Social History Narrative   Lives at home with husband (brother to another patient) and 2 youngest children   Originally from [place].    Came to [place] in [place]   Social Drivers of Health   Tobacco Use: Low Risk (12/22/2023)   Patient History    Smoking Tobacco Use: Never    Smokeless Tobacco Use: Never    Passive Exposure: Never  Financial Resource Strain: Low Risk (05/07/2023)   Overall Financial Resource Strain (CARDIA)    Difficulty of Paying Living Expenses: Not hard at all  Food Insecurity: No Food Insecurity (05/09/2023)   Hunger Vital Sign    Worried About Running Out of Food in the Last Year: Never true    Ran Out of Food in the Last Year: Never true  Transportation Needs: No Transportation Needs (05/09/2023)   PRAPARE - Administrator, Civil Service (Medical): No    Lack of Transportation (Non-Medical): No  Physical Activity: Inactive (05/07/2023)   Exercise Vital Sign    Days of Exercise per Week: 0 days    Minutes of Exercise per Session: 0 min  Stress: No Stress Concern Present (05/07/2023)   [employer] of Occupational Health - Occupational Stress Questionnaire    Feeling of Stress : Not at all  Social Connections: Socially Integrated (05/09/2023)   Social Connection and Isolation Panel    Frequency of Communication with Friends and Family: Twice a week    Frequency of Social Gatherings with Friends and Family: More than three times a week    Attends Religious Services: 1 to 4 times per year    Active Member of Clubs or Organizations: Yes    Attends Tax Inspector Meetings: 1 to 4 times per year    Marital Status: Married  Catering Manager Violence: Not At Risk (05/09/2023)   Humiliation, Afraid, Rape, and Kick questionnaire    Fear of Current or Ex-Partner: No    Emotionally Abused: No    Physically Abused: No    Sexually Abused: No  Depression (PHQ2-9): Low Risk (08/13/2023)   Depression (PHQ2-9)    PHQ-2 Score: 0  Alcohol Screen: Low Risk (05/07/2023)   Alcohol Screen    Last Alcohol Screening Score (AUDIT): 0  Housing: Low Risk (05/09/2023)   Housing Stability Vital Sign    Unable to Pay for Housing in the Last Year: No    Number  of Times Moved in the Last Year: 0    Homeless in the Last Year: No  Utilities: Not At Risk (05/09/2023)   AHC Utilities    Threatened with loss of utilities: No  Health Literacy: Adequate Health Literacy (05/07/2023)   B1300 Health Literacy    Frequency of need for help with medical instructions: Never    Review of Systems:  All other review of systems negative except as mentioned in the HPI.  Physical Exam: Vital signs BP (!) 100/57   Pulse 96   Temp 98.2 F (36.8 C) (Temporal)   Resp (!) 26   Ht 5' (1.524 m)   Wt 126.1 kg   LMP 05/02/2020   SpO2 96%   BMI 54.29 kg/m   General:   Alert,  Well-developed, well-nourished, pleasant and cooperative in NAD Airway:  Mallampati 2 Lungs:  Clear throughout to auscultation.   Heart:  Regular rate and rhythm; no murmurs, clicks, rubs,  or gallops. Abdomen:  Soft, nontender and nondistended. Normal bowel sounds.   Neuro/Psych:  Normal mood and affect. A and O x 3   Ashley Bultema E. Stacia, MD [facility]

## 2023-12-29 NOTE — Transfer of Care (Signed)
 Immediate Anesthesia Transfer of Care Note  Patient: Kayla Scott  Procedure(s) Performed: COLONOSCOPY  Patient Location: PACU and Endoscopy Unit  Anesthesia Type:MAC  Level of Consciousness: awake, alert , and oriented  Airway & Oxygen Therapy: Patient Spontanous Breathing and Patient connected to nasal cannula oxygen  Post-op Assessment: Report given to RN and Post -op Vital signs reviewed and stable  Post vital signs: Reviewed and stable  Last Vitals:  Vitals Value Taken Time  BP    Temp    Pulse    Resp    SpO2      Last Pain:  Vitals:   12/29/23 0805  TempSrc: Temporal  PainSc: 0-No pain         Complications: No notable events documented.

## 2023-12-29 NOTE — Op Note (Signed)
 [facility] Patient Name: Kayla Scott Procedure Date: 12/29/2023 MRN: [id number] Attending MD: Glendia BRAVO. Stacia , MD, [id number] Date of Birth: 04-14-1975 CSN: [id number] Age: 48 Admit Type: Outpatient Procedure:                Colonoscopy Indications:              Screening for colorectal malignant neoplasm, This                            is the patient's first colonoscopy Providers:                Glendia E. Stacia, MD, Ozell Pouch, Coye Bade, Technician Referring MD:              Medicines:                Monitored Anesthesia Care Complications:            No immediate complications. Estimated Blood Loss:     Estimated blood loss: none. Procedure:                Pre-Anesthesia Assessment:                           - Prior to the procedure, a History and Physical                            was performed, and patient medications and                            allergies were reviewed. The patient's tolerance of                            previous anesthesia was also reviewed. The risks                            and benefits of the procedure and the sedation                            options and risks were discussed with the patient.                            All questions were answered, and informed consent                            was obtained. Prior Anticoagulants: The patient has                            taken no anticoagulant or antiplatelet agents. ASA                            Grade Assessment: IV - A patient with severe  systemic disease that is a constant threat to life.                            After reviewing the risks and benefits, the patient                            was deemed in satisfactory condition to undergo the                            procedure.                           After obtaining informed consent, the colonoscope                            was  passed under direct vision. Throughout the                            procedure, the patient's blood pressure, pulse, and                            oxygen saturations were monitored continuously. The                            [id number]-[id number] ([id number] Olympus Colonoscope was                            introduced through the anus and advanced to the the                            terminal ileum, with identification of the                            appendiceal orifice and IC valve. The colonoscopy                            was performed without difficulty. The patient                            tolerated the procedure well. Scope In: 9:06:46 AM Scope Out: 9:21:10 AM Scope Withdrawal Time: 0 hours 7 minutes 50 seconds  Total Procedure Duration: 0 hours 14 minutes 24 seconds  Findings:      The perianal and digital rectal examinations were normal. Pertinent       negatives include normal sphincter tone and no palpable rectal lesions.      The colon (entire examined portion) appeared normal.      The terminal ileum appeared normal.      The retroflexed view of the distal rectum and anal verge was normal and       showed no anal or rectal abnormalities. Impression:               - The entire examined colon is normal.                           -  The examined portion of the ileum was normal.                           - The distal rectum and anal verge are normal on                            retroflexion view.                           - No specimens collected. Moderate Sedation:      Not Applicable - Patient had care per Anesthesia. Recommendation:           - Patient has a contact number available for                            emergencies. The signs and symptoms of potential                            delayed complications were discussed with the                            patient. Return to normal activities tomorrow.                            Written discharge instructions were provided  to the                            patient.                           - Resume previous diet.                           - Continue present medications.                           - Repeat colonoscopy in 10 years for screening                            purposes. Procedure Code(s):        --- Professional ---                           H9878, Colorectal cancer screening; colonoscopy on                            individual not meeting criteria for high risk Diagnosis Code(s):        --- Professional ---                           Z12.11, Encounter for screening for malignant                            neoplasm of colon CPT copyright 2022 [employer]. All rights reserved. The codes documented in this report are preliminary and upon coder review may  be revised to  meet current compliance requirements. Nalini Alcaraz E. Stacia, MD 12/29/2023 9:28:26 AM This report has been signed electronically. Number of Addenda: 0

## 2023-12-29 NOTE — Anesthesia Procedure Notes (Signed)
 Procedure Name: MAC Date/Time: 12/29/2023 9:19 AM  Performed by: Obadiah Reyes BROCKS, CRNAPre-anesthesia Checklist: Patient identified, Emergency Drugs available, Suction available, Patient being monitored and Timeout performed Patient Re-evaluated:Patient Re-evaluated prior to induction Oxygen Delivery Method: Simple face mask Preoxygenation: Pre-oxygenation with 100% oxygen Induction Type: IV induction

## 2023-12-30 ENCOUNTER — Encounter (HOSPITAL_COMMUNITY): Payer: Self-pay | Admitting: Gastroenterology

## 2024-01-14 ENCOUNTER — Other Ambulatory Visit: Payer: Self-pay

## 2024-01-14 ENCOUNTER — Other Ambulatory Visit: Payer: Self-pay | Admitting: Family Medicine

## 2024-01-14 DIAGNOSIS — E119 Type 2 diabetes mellitus without complications: Secondary | ICD-10-CM

## 2024-01-14 MED ORDER — METFORMIN HCL ER 500 MG PO TB24
1000.0000 mg | ORAL_TABLET | Freq: Two times a day (BID) | ORAL | 0 refills | Status: AC
  Filled 2024-01-14: qty 120, 30d supply, fill #0

## 2024-01-16 ENCOUNTER — Other Ambulatory Visit: Payer: Self-pay

## 2024-01-26 ENCOUNTER — Other Ambulatory Visit: Payer: Self-pay

## 2024-02-09 ENCOUNTER — Other Ambulatory Visit: Payer: Self-pay

## 2024-02-09 ENCOUNTER — Other Ambulatory Visit: Payer: Self-pay | Admitting: Family Medicine

## 2024-02-09 ENCOUNTER — Other Ambulatory Visit: Payer: Self-pay | Admitting: Nurse Practitioner

## 2024-02-09 ENCOUNTER — Other Ambulatory Visit: Payer: Self-pay | Admitting: Pharmacist

## 2024-02-09 DIAGNOSIS — E119 Type 2 diabetes mellitus without complications: Secondary | ICD-10-CM

## 2024-02-09 DIAGNOSIS — Z7985 Long-term (current) use of injectable non-insulin antidiabetic drugs: Secondary | ICD-10-CM

## 2024-02-09 MED ORDER — TRULICITY 1.5 MG/0.5ML ~~LOC~~ SOAJ
1.5000 mg | SUBCUTANEOUS | 1 refills | Status: AC
  Filled 2024-02-09: qty 2, 28d supply, fill #0

## 2024-02-12 ENCOUNTER — Other Ambulatory Visit: Payer: Self-pay

## 2024-02-19 ENCOUNTER — Other Ambulatory Visit: Payer: Self-pay
